# Patient Record
Sex: Female | Born: 1955 | Race: White | Hispanic: No | Marital: Married
Health system: Midwestern US, Community
[De-identification: ages and names within clinical notes are randomized; demographics above are authoritative.]

## PROBLEM LIST (undated history)

## (undated) DIAGNOSIS — I214 Non-ST elevation (NSTEMI) myocardial infarction: Secondary | ICD-10-CM

## (undated) DIAGNOSIS — J449 Chronic obstructive pulmonary disease, unspecified: Secondary | ICD-10-CM

## (undated) DIAGNOSIS — I2510 Atherosclerotic heart disease of native coronary artery without angina pectoris: Secondary | ICD-10-CM

## (undated) DIAGNOSIS — K219 Gastro-esophageal reflux disease without esophagitis: Secondary | ICD-10-CM

## (undated) HISTORY — PX: TUBAL LIGATION: SHX77

---

## 2013-04-29 MED ORDER — DEXAMETHASONE SODIUM PHOSPHATE 4 MG/ML IJ SOLN
4 mg/mL | Freq: Once | INTRAMUSCULAR | Status: AC
Start: 2013-04-29 — End: 2013-04-29

## 2013-04-29 MED ORDER — PREDNISONE 20 MG TAB
20 mg | ORAL_TABLET | ORAL | Status: DC
Start: 2013-04-29 — End: 2013-11-30

## 2013-04-29 MED ORDER — CYCLOBENZAPRINE 5 MG TAB
5 mg | ORAL_TABLET | Freq: Two times a day (BID) | ORAL | Status: DC | PRN
Start: 2013-04-29 — End: 2013-11-30

## 2013-04-29 NOTE — Progress Notes (Signed)
[facility]   [address] [place], [place] [address]  Phone [phone] * Fax [phone]    04/29/2013    Chief Complaint   Patient presents with   ??? Back Pain     hurt back this am at work       HISTORY OF PRESENT ILLNESS:    Kelsey Mullins is a 57 y.o. female who presents today with acute onset low back pain.  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  The pain is now radiating to her upper back.        Review of Systems   Genitourinary: Negative.    Musculoskeletal: Positive for back pain.   Neurological: Negative for tingling and focal weakness.         No Known Allergies    Past Medical History   Diagnosis Date   ??? GERD (gastroesophageal reflux disease)    ??? Depression    ??? Asthma      History reviewed. No pertinent past surgical history.    Current Outpatient Prescriptions   Medication Sig Dispense Refill   ??? dexamethasone (DECADRON) 4 mg/mL injection 1 mL by IntraMUSCular route once for 1 dose.  1 Vial  0   ??? predniSONE (DELTASONE) 20 mg tablet 60 mg x 1 day 40 mg x 4 days then 20 mg x 4 days.  15 Tab  0   ??? cyclobenzaprine (FLEXERIL) 5 mg tablet Take 1 Tab by mouth two (2) times daily as needed for Muscle Spasm(s).  25 Tab  0   ??? Dexlansoprazole (DEXILANT) 60 mg CpDB Take  by mouth.       ??? citalopram (CELEXA) 10 mg tablet Take  by mouth daily.       ??? fluticasone-salmeterol (ADVAIR DISKUS) 250-50 mcg/dose diskus inhaler Take 1 Puff by inhalation every twelve (12) hours.         History   Substance Use Topics   ??? Smoking status: Current Every Day Smoker   ??? Smokeless tobacco: Not on file   ??? Alcohol Use: No         PHYSICAL EXAMINATION:    BP 140/80   Pulse 70   Temp(Src) 97 ??F (36.1 ??C) (Oral)   Resp 18    Physical Exam   Vitals reviewed.  Constitutional: She appears well-developed and well-nourished. She appears distressed (secondary to back pain ).   Cardiovascular: Normal heart sounds.     Pulmonary/Chest: Effort normal and breath sounds normal.   Musculoskeletal:        Thoracic back: Normal.        Lumbar back: She exhibits tenderness, pain and spasm (of paraspinal muscles R>L). She exhibits no bony tenderness.   Neurological:   Strength equal bilaterally in the lower extremities 3/5 secondary to pain.        ASSESSMENT AND PLAN:      ICD-9-CM    1. Acute low back pain 724.2 DEXAMETHASONE SODIUM PHOSPHATE INJECTION 1 MG     dexamethasone (DECADRON) 4 mg/mL injection     PR THER/PROPH/DIAG INJECTION, SUBCUT/IM     predniSONE (DELTASONE) 20 mg tablet     cyclobenzaprine (FLEXERIL) 5 mg tablet     Discussed conservative care with use of ICE for 24 hours then heat as needed for pain.  Gentle stretching as tolerated.  No physical activity or labor for  4 days. A letter to return to work for Monday 05/04/13.    Patient reports she has had chiropractic care in the past.  Discussed benefits for this acute incident,  yet to wait until at least next week, until pain has decreased.     Follow-up Disposition:  Return if symptoms worsen or fail to improve.      Kelsey Mullins. Belva Crome, MHP

## 2013-05-05 NOTE — Progress Notes (Signed)
Copy of Physician's Report forwarded to [facility] [employer] to reflect status of work return and work level  Phone [phone]     Fax [phone]

## 2013-11-30 MED ORDER — DEXAMETHASONE SODIUM PHOSPHATE 4 MG/ML IJ SOLN
4 mg/mL | Freq: Once | INTRAMUSCULAR | Status: AC
Start: 2013-11-30 — End: 2013-11-30

## 2013-11-30 NOTE — Progress Notes (Signed)
HISTORY OF PRESENT ILLNESS  Kelsey Mullins is a 57 y.o. female.  Chief Complaint   Patient presents with   ??? Eye Swelling       HPI  Eyes puffy since this AM  Had hair died on 05-07-2023  Got itching that evening  Used same as prev  Tried 2 Benadryl for itching yesterday  No new dyes to her knowledge  Got it washed again  ROS  Past Medical History   Diagnosis Date   ??? GERD (gastroesophageal reflux disease)    ??? Depression    ??? Asthma      Current Outpatient Prescriptions   Medication Sig Dispense Refill   ??? dexamethasone (DECADRON) 4 mg/mL injection 1 mL by IntraMUSCular route once for 1 dose.  1 vial  0   ??? Dexlansoprazole (DEXILANT) 60 mg CpDB Take  by mouth.       ??? fluticasone-salmeterol (ADVAIR DISKUS) 250-50 mcg/dose diskus inhaler Take 1 Puff by inhalation every twelve (12) hours.         No Known Allergies  BP 130/70   Pulse 68   Temp(Src) 97.9 ??F (36.6 ??C) (Temporal)   Resp 18   Ht 5\' 5"  (1.651 m)   Wt 179 lb (81.194 kg)   BMI 29.79 kg/m2    Physical Exam   Nursing note and vitals reviewed.  Constitutional: She is oriented to person, place, and time. She appears well-developed and well-nourished. No distress.   HENT:   Head: Normocephalic and atraumatic.   Eyes: Conjunctivae and EOM are normal.   Swollen eyelids   Pulmonary/Chest: Effort normal.   Neurological: She is alert and oriented to person, place, and time.   Skin: Skin is warm.   Erythematous scalp, swollen top of ears with blistery lesions with yellow drainage   Psychiatric: She has a normal mood and affect.       ASSESSMENT and PLAN    ICD-9-CM    1. Allergic dermatitis 692.9 DEXAMETHASONE SODIUM PHOSPHATE INJECTION 1 MG     dexamethasone (DECADRON) 4 mg/mL injection     PR THER/PROPH/DIAG INJECTION, SUBCUT/IM   Claritin during the day, Benadryl at night, Zantac prn if needed in addition

## 2017-05-19 ENCOUNTER — Encounter (HOSPITAL_COMMUNITY): Payer: Self-pay

## 2017-05-19 ENCOUNTER — Inpatient Hospital Stay (HOSPITAL_COMMUNITY)
Admission: EM | Admit: 2017-05-19 | Discharge: 2017-05-26 | DRG: 233 | Disposition: A | Discharge status: Home / Self Care | Payer: BLUE CROSS/BLUE SHIELD | Attending: Thoracic Surgery (Cardiothoracic Vascular Surgery) | Admitting: Thoracic Surgery (Cardiothoracic Vascular Surgery)

## 2017-05-19 ENCOUNTER — Emergency Department (HOSPITAL_COMMUNITY): Payer: BLUE CROSS/BLUE SHIELD

## 2017-05-19 ENCOUNTER — Observation Stay (HOSPITAL_COMMUNITY): Payer: BLUE CROSS/BLUE SHIELD

## 2017-05-19 DIAGNOSIS — R1013 Epigastric pain: Secondary | ICD-10-CM

## 2017-05-19 DIAGNOSIS — F17200 Nicotine dependence, unspecified, uncomplicated: Secondary | ICD-10-CM | POA: Diagnosis present

## 2017-05-19 DIAGNOSIS — Z79899 Other long term (current) drug therapy: Secondary | ICD-10-CM

## 2017-05-19 DIAGNOSIS — K219 Gastro-esophageal reflux disease without esophagitis: Secondary | ICD-10-CM | POA: Diagnosis present

## 2017-05-19 DIAGNOSIS — R079 Chest pain, unspecified: Secondary | ICD-10-CM | POA: Diagnosis present

## 2017-05-19 DIAGNOSIS — J440 Chronic obstructive pulmonary disease with acute lower respiratory infection: Secondary | ICD-10-CM | POA: Diagnosis present

## 2017-05-19 DIAGNOSIS — J449 Chronic obstructive pulmonary disease, unspecified: Secondary | ICD-10-CM | POA: Diagnosis present

## 2017-05-19 DIAGNOSIS — J189 Pneumonia, unspecified organism: Secondary | ICD-10-CM | POA: Diagnosis not present

## 2017-05-19 DIAGNOSIS — R778 Other specified abnormalities of plasma proteins: Secondary | ICD-10-CM | POA: Diagnosis present

## 2017-05-19 DIAGNOSIS — I214 Non-ST elevation (NSTEMI) myocardial infarction: Principal | ICD-10-CM | POA: Diagnosis present

## 2017-05-19 DIAGNOSIS — I2510 Atherosclerotic heart disease of native coronary artery without angina pectoris: Secondary | ICD-10-CM

## 2017-05-19 DIAGNOSIS — J90 Pleural effusion, not elsewhere classified: Secondary | ICD-10-CM | POA: Diagnosis not present

## 2017-05-19 DIAGNOSIS — I200 Unstable angina: Secondary | ICD-10-CM

## 2017-05-19 DIAGNOSIS — K5900 Constipation, unspecified: Secondary | ICD-10-CM | POA: Diagnosis not present

## 2017-05-19 DIAGNOSIS — I252 Old myocardial infarction: Secondary | ICD-10-CM

## 2017-05-19 DIAGNOSIS — Z6833 Body mass index (BMI) 33.0-33.9, adult: Secondary | ICD-10-CM

## 2017-05-19 DIAGNOSIS — J9811 Atelectasis: Secondary | ICD-10-CM | POA: Diagnosis not present

## 2017-05-19 DIAGNOSIS — Z951 Presence of aortocoronary bypass graft: Secondary | ICD-10-CM

## 2017-05-19 DIAGNOSIS — R7989 Other specified abnormal findings of blood chemistry: Secondary | ICD-10-CM

## 2017-05-19 DIAGNOSIS — R748 Abnormal levels of other serum enzymes: Secondary | ICD-10-CM | POA: Diagnosis not present

## 2017-05-19 DIAGNOSIS — I25110 Atherosclerotic heart disease of native coronary artery with unstable angina pectoris: Secondary | ICD-10-CM | POA: Diagnosis present

## 2017-05-19 DIAGNOSIS — F17210 Nicotine dependence, cigarettes, uncomplicated: Secondary | ICD-10-CM | POA: Diagnosis present

## 2017-05-19 DIAGNOSIS — E8770 Fluid overload, unspecified: Secondary | ICD-10-CM | POA: Diagnosis not present

## 2017-05-19 HISTORY — DX: Chronic obstructive pulmonary disease, unspecified: J44.9

## 2017-05-19 HISTORY — DX: Gastro-esophageal reflux disease without esophagitis: K21.9

## 2017-05-19 LAB — CREATININE, SERUM
Creatinine, Ser: 0.58 mg/dL (ref 0.44–1.00)
GFR calc Af Amer: >60 mL/min (ref >60)
GFR calc non Af Amer: >60 mL/min (ref >60)

## 2017-05-19 LAB — CBC
HCT: 46.7 % — ABNORMAL HIGH (ref 36.0–46.0)
HCT: 44.1 % (ref 36.0–46.0)
HCT: 44.9 % (ref 36.0–46.0)
HEMOGLOBIN: 15.5 g/dL — AB (ref 12.0–15.0)
Hemoglobin: 14.6 g/dL (ref 12.0–15.0)
Hemoglobin: 14.8 g/dL (ref 12.0–15.0)
MCH: 31.9 pg (ref 26.0–34.0)
MCH: 31.4 pg (ref 26.0–34.0)
MCH: 31.6 pg (ref 26.0–34.0)
MCHC: 33.2 g/dL (ref 30.0–36.0)
MCHC: 33 g/dL (ref 30.0–36.0)
MCHC: 33.1 g/dL (ref 30.0–36.0)
MCV: 96.1 fL (ref 78.0–100.0)
MCV: 95.3 fL (ref 78.0–100.0)
MCV: 95.5 fL (ref 78.0–100.0)
PLATELETS: 216 10*3/uL (ref 150–400)
Platelets: 216 10*3/uL (ref 150–400)
Platelets: 229 10*3/uL (ref 150–400)
RBC: 4.86 MIL/uL (ref 3.87–5.11)
RBC: 4.62 MIL/uL (ref 3.87–5.11)
RBC: 4.71 MIL/uL (ref 3.87–5.11)
RDW: 13.1 % (ref 11.5–15.5)
RDW: 13 % (ref 11.5–15.5)
RDW: 13.1 % (ref 11.5–15.5)
WBC: 7.6 10*3/uL (ref 4.0–10.5)
WBC: 9 10*3/uL (ref 4.0–10.5)
WBC: 9.4 10*3/uL (ref 4.0–10.5)

## 2017-05-19 LAB — BASIC METABOLIC PANEL
ANION GAP: 8 (ref 5–15)
Anion gap: 7 (ref 5–15)
BUN: 11 mg/dL (ref 6–20)
BUN: 10 mg/dL (ref 6–20)
CALCIUM: 9.3 mg/dL (ref 8.9–10.3)
CHLORIDE: 107 mmol/L (ref 101–111)
CO2: 25 mmol/L (ref 22–32)
CO2: 26 mmol/L (ref 22–32)
CREATININE: 0.57 mg/dL (ref 0.44–1.00)
Calcium: 9 mg/dL (ref 8.9–10.3)
Chloride: 106 mmol/L (ref 101–111)
Creatinine, Ser: 0.57 mg/dL (ref 0.44–1.00)
GFR calc Af Amer: >60 mL/min (ref >60)
GFR calc non Af Amer: >60 mL/min (ref >60)
GFR calc non Af Amer: >60 mL/min (ref >60)
Glucose, Bld: 133 mg/dL — ABNORMAL HIGH (ref 65–99)
Glucose, Bld: 101 mg/dL — ABNORMAL HIGH (ref 65–99)
Potassium: 4.1 mmol/L (ref 3.5–5.1)
Potassium: 3.8 mmol/L (ref 3.5–5.1)
SODIUM: 140 mmol/L (ref 135–145)
Sodium: 139 mmol/L (ref 135–145)

## 2017-05-19 LAB — I-STAT TROPONIN, ED
TROPONIN I, POC: 0.09 ng/mL — AB (ref 0.00–0.08)
TROPONIN I, POC: 0.88 ng/mL — AB (ref 0.00–0.08)

## 2017-05-19 LAB — LIPASE, BLOOD: LIPASE: 25 U/L (ref 11–51)

## 2017-05-19 LAB — HEPATIC FUNCTION PANEL
ALT: 27 U/L (ref 14–54)
AST: 23 U/L (ref 15–41)
Albumin: 3.7 g/dL (ref 3.5–5.0)
Alkaline Phosphatase: 93 U/L (ref 38–126)
TOTAL PROTEIN: 7 g/dL (ref 6.5–8.1)
Total Bilirubin: 0.6 mg/dL (ref 0.3–1.2)

## 2017-05-19 LAB — PROTIME-INR
INR: 0.95
Prothrombin Time: 12.7 seconds (ref 11.4–15.2)

## 2017-05-19 LAB — TROPONIN I
Troponin I: 0.92 ng/mL (ref <0.03)
Troponin I: 0.8 ng/mL (ref <0.03)

## 2017-05-19 MED ORDER — ALPRAZOLAM 0.25 MG PO TABS
0.2500 mg | ORAL_TABLET | Freq: Two times a day (BID) | ORAL | Status: DC | PRN
  Administered 2017-05-20: 0.25 mg via ORAL
  Filled 2017-05-19: qty 1

## 2017-05-19 MED ORDER — DIATRIZOATE MEGLUMINE & SODIUM 66-10 % PO SOLN
ORAL | Status: AC
  Filled 2017-05-19: qty 30

## 2017-05-19 MED ORDER — SODIUM CHLORIDE 0.9 % IV SOLN: 250.0000 mL | INTRAVENOUS | Status: DC | PRN

## 2017-05-19 MED ORDER — ZOLPIDEM TARTRATE 5 MG PO TABS
5.0000 mg | ORAL_TABLET | Freq: Every evening | ORAL | Status: DC | PRN
  Administered 2017-05-20 – 2017-05-21 (×2): 5 mg via ORAL
  Filled 2017-05-19 (×2): qty 1

## 2017-05-19 MED ORDER — ONDANSETRON HCL 4 MG/2ML IJ SOLN
4.0000 mg | Freq: Once | INTRAMUSCULAR | Status: AC
  Administered 2017-05-19: 4 mg via INTRAVENOUS
  Filled 2017-05-19: qty 2

## 2017-05-19 MED ORDER — NICOTINE 14 MG/24HR TD PT24
14.0000 mg | MEDICATED_PATCH | Freq: Every day | TRANSDERMAL | Status: DC
  Administered 2017-05-19 – 2017-05-21 (×3): 14 mg via TRANSDERMAL
  Filled 2017-05-19 (×3): qty 1

## 2017-05-19 MED ORDER — NITROGLYCERIN 2 % TD OINT
0.5000 [in_us] | TOPICAL_OINTMENT | Freq: Four times a day (QID) | TRANSDERMAL | Status: DC
  Administered 2017-05-19 – 2017-05-20 (×3): 0.5 [in_us] via TOPICAL

## 2017-05-19 MED ORDER — ASPIRIN 81 MG PO CHEW
81.0000 mg | CHEWABLE_TABLET | ORAL | Status: AC
  Administered 2017-05-20: 81 mg via ORAL

## 2017-05-19 MED ORDER — SODIUM CHLORIDE 0.9 % WEIGHT BASED INFUSION
1.0000 mL/kg/h | INTRAVENOUS | Status: DC
  Administered 2017-05-19 – 2017-05-20 (×2): 1 mL/kg/h via INTRAVENOUS

## 2017-05-19 MED ORDER — PANTOPRAZOLE SODIUM 40 MG IV SOLR
40.0000 mg | Freq: Once | INTRAVENOUS | Status: AC
  Administered 2017-05-19: 40 mg via INTRAVENOUS
  Filled 2017-05-19: qty 40

## 2017-05-19 MED ORDER — PANTOPRAZOLE SODIUM 40 MG PO TBEC
40.0000 mg | DELAYED_RELEASE_TABLET | Freq: Every day | ORAL | Status: DC
  Administered 2017-05-20 – 2017-05-22 (×3): 40 mg via ORAL
  Filled 2017-05-19 (×3): qty 1

## 2017-05-19 MED ORDER — GI COCKTAIL ~~LOC~~
30.0000 mL | Freq: Once | ORAL | Status: AC
  Administered 2017-05-19: 30 mL via ORAL
  Filled 2017-05-19: qty 30

## 2017-05-19 MED ORDER — ONDANSETRON HCL 4 MG/2ML IJ SOLN
4.0000 mg | Freq: Four times a day (QID) | INTRAMUSCULAR | Status: DC | PRN
  Administered 2017-05-20: 4 mg via INTRAVENOUS
  Filled 2017-05-19: qty 2

## 2017-05-19 MED ORDER — ASPIRIN 81 MG PO CHEW
81.0000 mg | CHEWABLE_TABLET | Freq: Every day | ORAL | Status: DC
  Administered 2017-05-21: 81 mg via ORAL
  Filled 2017-05-19 (×2): qty 1

## 2017-05-19 MED ORDER — GI COCKTAIL ~~LOC~~
30.0000 mL | Freq: Three times a day (TID) | ORAL | Status: DC | PRN
  Filled 2017-05-19 (×2): qty 30

## 2017-05-19 MED ORDER — NITROGLYCERIN 0.4 MG SL SUBL
0.4000 mg | SUBLINGUAL_TABLET | SUBLINGUAL | Status: AC | PRN
  Administered 2017-05-19 (×3): 0.4 mg via SUBLINGUAL
  Filled 2017-05-19 (×2): qty 1

## 2017-05-19 MED ORDER — IOPAMIDOL (ISOVUE-300) INJECTION 61%
INTRAVENOUS | Status: AC
  Administered 2017-05-19: 75 mL
  Filled 2017-05-19: qty 75

## 2017-05-19 MED ORDER — ACETAMINOPHEN 325 MG PO TABS
650.0000 mg | ORAL_TABLET | ORAL | Status: DC | PRN
  Administered 2017-05-20 – 2017-05-21 (×5): 650 mg via ORAL
  Filled 2017-05-19 (×5): qty 2

## 2017-05-19 MED ORDER — HEPARIN SODIUM (PORCINE) 5000 UNIT/ML IJ SOLN
5000.0000 [IU] | Freq: Three times a day (TID) | INTRAMUSCULAR | Status: DC
  Administered 2017-05-19 – 2017-05-20 (×2): 5000 [IU] via SUBCUTANEOUS
  Filled 2017-05-19 (×3): qty 1

## 2017-05-19 MED ORDER — ASPIRIN 81 MG PO CHEW: 324.0000 mg | CHEWABLE_TABLET | Freq: Once | ORAL | Status: DC

## 2017-05-19 MED ORDER — NITROGLYCERIN 0.4 MG SL SUBL
SUBLINGUAL_TABLET | SUBLINGUAL | Status: AC
  Administered 2017-05-19 (×2): 0.4 mg
  Filled 2017-05-19: qty 1

## 2017-05-19 NOTE — ED Triage Notes (Signed)
To room via triage.  Onset 2 days ago while travelling from [place] to [place] pt started having epigastric pain that is worsening.  Pt unable to sleep last night d/t pain.  Nothing makes better.  Eating, moving makes worse.  Pt had endoscopy 05-17-17 for heartburn, biopsy done, hasn't heard results yet,Prilosec was changed from qd to bid.

## 2017-05-19 NOTE — ED Provider Notes (Signed)
[facility] DEPT Provider Note   CSN: [id number] Arrival date & time: 05/19/17  1216     History   Chief Complaint Chief Complaint  Patient presents with  . Chest Pain    HPI Sharon Cochran is a 61 y.o. female.  HPI Patient with history of COPD and gastritis presents with epigastric pain radiating up into the chest has been constant since her endoscopy on Friday. She's been taking her PPI's instructed. She takes diclofenac for arthritic pain. States she's had nausea but no vomiting. No melanotic stools. Denies any fever or chills. Past Medical History:  Diagnosis Date  . COPD (chronic obstructive pulmonary disease) (HCC)   . GERD (gastroesophageal reflux disease)     Patient Active Problem List   Diagnosis Date Noted  . Non-STEMI (non-ST elevated myocardial infarction) (HCC) 05/20/2017  . Epigastric pain   . Unstable angina (HCC)   . CAD in native artery   . Chest pain with moderate risk of acute coronary syndrome 05/19/2017  . Troponin level elevated 05/19/2017  . GERD (gastroesophageal reflux disease) 05/19/2017  . Smoker 05/19/2017  . COPD (chronic obstructive pulmonary disease) (HCC) 05/19/2017    Past Surgical History:  Procedure Laterality Date  . INTRAVASCULAR PRESSURE WIRE/FFR STUDY N/A 05/20/2017   Procedure: Intravascular Pressure Wire/FFR Study;  Surgeon: Yvonne Kendall, MD;  Location: [facility] IN[facility]AS[facility] CV [facility];  Service: Cardiovascular;  Laterality: N/A;  . LEFT HEART CATH AND CORONARY ANGIOGRAPHY N/A 05/20/2017   Procedure: Left Heart Cath and Coronary Angiography;  Surgeon: Yvonne Kendall, MD;  Location: [facility] INVAS[facility] CV LAB;  Service: Cardiovascular;  Laterality: N/A;  . TUBAL LIGATION      OB History    No data available       Home Medications    Prior to Admission medications   Medication Sig Start Date End Date Taking? Authorizing Provider  albuterol (PROVENTIL HFA;VENTOLIN HFA) 108 (90 Base) MCG/ACT inhaler Inhale 1-2 puffs into the lungs  every 6 (six) hours as needed for wheezing or shortness of breath.   Yes [provider]  albuterol (PROVENTIL) (2.5 MG/3ML) 0.083% nebulizer solution Take 2.5 mg by nebulization every 6 (six) hours as needed for wheezing or shortness of breath.   Yes [provider]  diclofenac (CATAFLAM) 50 MG tablet Take 50 mg by mouth 2 (two) times daily. 05/09/17  Yes [provider]  Fluticasone-Salmeterol (ADVAIR) 250-50 MCG/DOSE AEPB Inhale 1 puff into the lungs 2 (two) times daily.   Yes [provider]  metroNIDAZOLE (METROGEL) 1 % gel Apply 1 application topically at bedtime.   Yes [provider]  omeprazole (PRILOSEC) 20 MG capsule Take 20 mg by mouth daily.   Yes [provider]  venlafaxine XR (EFFEXOR-XR) 75 MG 24 hr capsule Take 75 mg by mouth daily with breakfast.   Yes [provider]  traZODone (DESYREL) 50 MG tablet Take 50 mg by mouth at bedtime.    [provider]    Family History History reviewed. No pertinent family history.  Social History Social History  Substance Use Topics  . Smoking status: Current Every Day Smoker    Packs/day: 1.00    Types: Cigarettes  . Smokeless tobacco: Never Used  . Alcohol use 2.4 oz/week    4 Shots of liquor per week     Allergies   Patient has no known allergies.   Review of Systems Review of Systems  Constitutional: Negative for chills and fever.  Respiratory: Negative for cough and shortness of  breath.   Cardiovascular: Positive for chest pain. Negative for palpitations and leg swelling.  Gastrointestinal: Positive for abdominal pain and nausea. Negative for constipation, diarrhea and vomiting.  Musculoskeletal: Negative for back pain, myalgias, neck pain and neck stiffness.  Skin: Negative for rash and wound.  Neurological: Negative for dizziness, weakness, light-headedness, numbness and headaches.  All other systems reviewed and are negative.    Physical  Exam Updated Vital Signs BP 130/78   Pulse 85   Temp 98.7 F (37.1 C) (Axillary)   Resp 16   Ht 5' (1.524 m)   Wt 74.9 kg (165 lb 1.6 oz)   SpO2 95%   BMI 32.24 kg/m   Physical Exam  Constitutional: She is oriented to person, place, and time. She appears well-developed and well-nourished.  Tearful  HENT:  Head: Normocephalic and atraumatic.  Mouth/Throat: Oropharynx is clear and moist. No oropharyngeal exudate.  Eyes: EOM are normal. Pupils are equal, round, and reactive to light.  Neck: Normal range of motion. Neck supple.  Cardiovascular: Normal rate and regular rhythm.  Exam reveals no gallop and no friction rub.   No murmur heard. Pulmonary/Chest: Effort normal and breath sounds normal. No respiratory distress. She has no wheezes. She has no rales. She exhibits no tenderness.  Abdominal: Soft. Bowel sounds are normal. There is tenderness (mild epigastric and left upper quadrant tenderness to palpation.). There is no rebound and no guarding.  Musculoskeletal: Normal range of motion. She exhibits no edema or tenderness.  No CVA tenderness bilaterally. No lower extremity swelling or asymmetry.  Neurological: She is alert and oriented to person, place, and time.  Moving all Extremities without focal deficit. Sensation intact.  Skin: Skin is warm and dry. Capillary refill takes less than 2 seconds. No rash noted. No erythema.  Psychiatric: She has a normal mood and affect. Her behavior is normal.  Nursing note and vitals reviewed.    ED Treatments / Results  Labs (all labs ordered are listed, but only abnormal results are displayed) Labs Reviewed  BASIC METABOLIC PANEL - Abnormal; Notable for the following:       Result Value   Glucose, Bld 133 (*)    All other components within normal limits  CBC - Abnormal; Notable for the following:    Hemoglobin 15.5 (*)    HCT 46.7 (*)    All other components within normal limits  HEPATIC FUNCTION PANEL - Abnormal; Notable for the  following:    Bilirubin, Direct <0.1 (*)    All other components within normal limits  TROPONIN I - Abnormal; Notable for the following:    Troponin I 0.92 (*)    All other components within normal limits  TROPONIN I - Abnormal; Notable for the following:    Troponin I 0.80 (*)    All other components within normal limits  TROPONIN I - Abnormal; Notable for the following:    Troponin I 0.37 (*)    All other components within normal limits  TROPONIN I - Abnormal; Notable for the following:    Troponin I 0.20 (*)    All other components within normal limits  BASIC METABOLIC PANEL - Abnormal; Notable for the following:    Glucose, Bld 101 (*)    All other components within normal limits  LIPID PANEL - Abnormal; Notable for the following:    HDL 32 (*)    All other components within normal limits  I-STAT TROPOININ, ED - Abnormal; Notable for the following:  Troponin i, poc 0.09 (*)    All other components within normal limits  I-STAT TROPOININ, ED - Abnormal; Notable for the following:    Troponin i, poc 0.88 (*)    All other components within normal limits  LIPASE, BLOOD  HIV ANTIBODY (ROUTINE TESTING)  CBC  CREATININE, SERUM  PROTIME-INR  CBC  HEPARIN LEVEL (UNFRACTIONATED)  CBC  POCT ACTIVATED CLOTTING TIME    EKG  EKG Interpretation  Date/Time:  Sunday May 19 2017 16:39:11 EDT Ventricular Rate:  69 PR Interval:    QRS Duration: 109 QT Interval:  418 QTC Calculation: 448 R Axis:   -7 Text Interpretation:  Sinus rhythm Low voltage, extremity leads Abnormal R-wave progression, early transition Confirmed by Drema Pry [phone]) on 05/20/2017 11:48:58 PM       Radiology Dg Chest 2 View  Result Date: 05/19/2017 CLINICAL DATA:  Epigastric abdominal pain beginning 2 days ago. History of COPD. EXAM: CHEST  2 VIEW COMPARISON:  None. FINDINGS: Normal cardiac silhouette and mediastinal contours. The lungs are hyperexpanded with flattening of the bilateral hemidiaphragms.  No focal parenchymal opacities. No pleural effusion or pneumothorax. No evidence of edema. No acute osseous abnormalities. IMPRESSION: Hyperexpanded lungs without acute cardiopulmonary disease. Specifically, no focal airspace opacities to suggest pneumonia. Electronically Signed   By: Simonne Come M.D.   On: 05/19/2017 12:51   Ct Chest W Contrast  Result Date: 05/19/2017 CLINICAL DATA:  Epigastric pain 72 hours after endoscopy. EXAM: CT CHEST WITHOUT AND WITH CONTRAST TECHNIQUE: Protocol was requested by GI. A without chest CT was obtained. Subsequently Multidetector CT imaging of the chest was performed during intravenous and oral contrast administration. CONTRAST:  75mL ISOVUE-300 IOPAMIDOL (ISOVUE-300) INJECTION 61%IV COMPARISON:  None. FINDINGS: Cardiovascular: Normal heart size. No pericardial effusion. No evidence of acute vascular disease. Mild atherosclerotic calcifications seen along the distal brachiocephalic. Mediastinum/Nodes: No pneumomediastinum, esophageal thickening, or extravasation of the oral contrast which is seen throughout the esophagus. Negative for adenopathy or swelling. Lungs/Pleura: Negative for pneumonia or air leak. Upper Abdomen: The stomach was covered and is negative for wall thickening. No epigastric pneumoperitoneum. Musculoskeletal: No acute or aggressive finding. Probable dermal inclusion cyst in the subcutaneous right paramedian lower back. IMPRESSION: No acute finding. There is no pneumomediastinum or extravasation of esophageal contrast. The stomach was visualized and negative. Electronically Signed   By: Marnee Spring M.D.   On: 05/19/2017 20:40    Procedures Procedures (including critical care time)  Medications Ordered in ED Medications  aspirin chewable tablet 324 mg ( Oral MAR Unhold 05/20/17 1011)  acetaminophen (TYLENOL) tablet 650 mg (650 mg Oral Given 05/20/17 1451)  ondansetron (ZOFRAN) injection 4 mg (4 mg Intravenous Given 05/20/17 1420)  0.9 %  sodium  chloride infusion ( Intravenous MAR Unhold 05/20/17 1011)  ALPRAZolam (XANAX) tablet 0.25 mg (0.25 mg Oral Given 05/20/17 2213)  zolpidem (AMBIEN) tablet 5 mg ( Oral MAR Unhold 05/20/17 1011)  pantoprazole (PROTONIX) EC tablet 40 mg ( Oral MAR Unhold 05/20/17 1011)  aspirin chewable tablet 81 mg ( Oral MAR Unhold 05/20/17 1011)  gi cocktail (Maalox,Lidocaine,Donnatal) ( Oral MAR Unhold 05/20/17 1011)  diatrizoate meglumine-sodium (GASTROGRAFIN) 66-10 % solution (not administered)  nicotine (NICODERM CQ - dosed in mg/24 hours) patch 14 mg (14 mg Transdermal Patch Applied 05/20/17 1045)  0.9 %  sodium chloride infusion ( Intravenous Transfusing/Transfer 05/20/17 1048)  sodium chloride flush (NS) 0.9 % injection 3 mL (3 mLs Intravenous Not Given 05/20/17 2200)  sodium chloride flush (NS) 0.9 %  injection 3 mL (not administered)  0.9 %  sodium chloride infusion (not administered)  atorvastatin (LIPITOR) tablet 80 mg (80 mg Oral Given 05/20/17 1933)  metoprolol tartrate (LOPRESSOR) tablet 12.5 mg (12.5 mg Oral Given 05/20/17 2213)  venlafaxine XR (EFFEXOR-XR) 24 hr capsule 75 mg (75 mg Oral Given 05/20/17 1131)  traMADol (ULTRAM) tablet 50 mg (50 mg Oral Given 05/20/17 1933)  heparin ADULT infusion 100 units/mL (25000 units/258mL sodium chloride 0.45%) (900 Units/hr Intravenous New Bag/Given 05/20/17 1959)  nitroGLYCERIN 50 mg in dextrose 5 % 250 mL (0.2 mg/mL) infusion (5 mcg Intravenous New Bag/Given 05/20/17 1909)  nitroGLYCERIN (NITROSTAT) SL tablet 0.4 mg (not administered)  gi cocktail (Maalox,Lidocaine,Donnatal) (30 mLs Oral Given 05/19/17 1332)  ondansetron (ZOFRAN) injection 4 mg (4 mg Intravenous Given 05/19/17 1332)  pantoprazole (PROTONIX) injection 40 mg (40 mg Intravenous Given 05/19/17 1332)  nitroGLYCERIN (NITROSTAT) SL tablet 0.4 mg (0.4 mg Sublingual Given 05/19/17 1833)  aspirin chewable tablet 81 mg (81 mg Oral Given 05/20/17 0517)  iopamidol (ISOVUE-300) 61 % injection (75 mLs  Contrast Given 05/19/17 2005)    nitroGLYCERIN (NITROSTAT) 0.4 MG SL tablet (0.4 mg  Given 05/19/17 2238)  heparin infusion 2 units/mL in 0.9 % sodium chloride (1,000 mLs Intra-arterial New Bag/Given 05/20/17 0852)  adenosine (diagnostic) 140 mcg/kg/min (140 mcg/kg/min  74.9 kg Intravenous New Bag/Given 05/20/17 0937)   CRITICAL CARE Performed by: Ranae Palms, Siennah Barrasso Total critical care time: 30 minutes Critical care time was exclusive of separately billable procedures and treating other patients. Critical care was necessary to treat or prevent imminent or life-threatening deterioration. Critical care was time spent personally by me on the following activities: development of treatment plan with patient and/or surrogate as well as nursing, discussions with consultants, evaluation of patient's response to treatment, examination of patient, obtaining history from patient or surrogate, ordering and performing treatments and interventions, ordering and review of laboratory studies, ordering and review of radiographic studies, pulse oximetry and re-evaluation of patient's condition.  Initial Impression / Assessment and Plan / ED Course  I have reviewed the triage vital signs and the nursing notes.  Pertinent labs & imaging results that were available during my care of the patient were reviewed by me and considered in my medical decision making (see chart for details).    Repeat troponin is significantly elevated. Discussed with cardiology who will see patient in emergency department.   Final Clinical Impressions(s) / ED Diagnoses   Final diagnoses:  Epigastric pain    New Prescriptions Current Discharge Medication List       Loren Racer, MD 05/21/17 [phone]

## 2017-05-19 NOTE — ED Notes (Signed)
Patient transported to X-ray 

## 2017-05-19 NOTE — H&P (Signed)
Patient ID: Sharon Cochran MRN: [id number], DOB/AGE: [birth year]   Admit date: 05/19/2017   Primary Physician: System, Pcp Not In Primary Cardiologist: New  HPI: 61 y/o overweight Caucasian female with a history of GERD, smoking, and COPD. She and her husband live in [place] and are here for her nephew's graduation. She recently (Friday 6/1) had an endoscopy with biopsy in [facility]. She says she did not have a dilatation. She apprently has been having epigastric pain for some time. She had a negative POET two weeks ago per her history.  Ever since the evening of her endoscopy she has been having epigastric discomfort. It does not radiate. It does seem to be worse with inspiration. She was going to call her doctor Monday if it wasn't better but today while at church it got so bad she had to get up and leave. She has received SL NTG and GI cocktail, she is not sure which one helped. Her Troponin poc is elevated-0.09- 0.88. She continues to have epigastric discomfort. EKG shows no acute changes.   Problem List: Past Medical History:  Diagnosis Date  . COPD (chronic obstructive pulmonary disease) (HCC)     History reviewed. No pertinent surgical history.   Allergies: No Known Allergies   Home Medications Inhalers prn  Past Medical History:  Diagnosis Date  . COPD (chronic obstructive pulmonary disease) (HCC)     Prior to Admission medications   Not on File     FM Hx- no immediate family history of CAD or MI   Social History   Social History  . Marital status: Married    Spouse name: N/A  . Number of children: N/A  . Years of education: N/A   Occupational History  . Not on file.   Social History Main Topics  . Smoking status: Current Every Day Smoker    Packs/day: 1.00    Types: Cigarettes  . Smokeless tobacco: Never Used  . Alcohol use 2.4 oz/week    4 Shots of liquor per week  . Drug use: No  . Sexual activity: Not on file   Other Topics Concern    . Not on file   Social History Narrative  . No narrative on file     Review of Systems: General: negative for chills, fever, night sweats or weight changes.  Cardiovascular: negative for dyspnea on exertion, edema, orthopnea, palpitations, paroxysmal nocturnal dyspnea or shortness of breath HEENT: negative for any visual disturbances, blindness, glaucoma Dermatological: negative for rash Respiratory: negative for cough, hemoptysis, or wheezing Urologic: negative for hematuria or dysuria Abdominal: negative for nausea, vomiting, diarrhea, bright red blood per rectum, melena, or hematemesis Neurologic: negative for visual changes, syncope, or dizziness Musculoskeletal: negative for back pain, joint pain, or swelling Psych: cooperative and appropriate All other systems reviewed and are otherwise negative except as noted above.  Physical Exam: Blood pressure 140/89, pulse 63, temperature 98 F (36.7 C), temperature source Oral, resp. rate 20, SpO2 99 %.  General appearance: alert, cooperative, no distress and mildly obese Neck: no carotid bruit and no JVD Lungs: clear to auscultation bilaterally Heart: regular rate and rhythm Abdomen: soft, non-tender; bowel sounds normal; no masses,  no organomegaly Extremities: extremities normal, atraumatic, no cyanosis or edema Pulses: 2+ and symmetric Skin: Skin color, texture, turgor normal. No rashes or lesions Neurologic: Grossly normal    Labs:   Results for orders placed or performed during the hospital encounter of 05/19/17 (from the past 24 hour(s))  Basic metabolic panel     Status: Abnormal   Collection Time: 05/19/17 12:34 PM  Result Value Ref Range   Sodium 140 135 - 145 mmol/L   Potassium 4.1 3.5 - 5.1 mmol/L   Chloride 107 101 - 111 mmol/L   CO2 25 22 - 32 mmol/L   Glucose, Bld 133 (H) 65 - 99 mg/dL   BUN 11 6 - 20 mg/dL   Creatinine, Ser 4.09 0.44 - 1.00 mg/dL   Calcium 9.3 8.9 - 81.1 mg/dL   GFR calc non Af Amer >60  >60 mL/min   GFR calc Af Amer >60 >60 mL/min   Anion gap 8 5 - 15  CBC     Status: Abnormal   Collection Time: 05/19/17 12:34 PM  Result Value Ref Range   WBC 7.6 4.0 - 10.5 K/uL   RBC 4.86 3.87 - 5.11 MIL/uL   Hemoglobin 15.5 (H) 12.0 - 15.0 g/dL   HCT 91.4 (H) 78.2 - 95.6 %   MCV 96.1 78.0 - 100.0 fL   MCH 31.9 26.0 - 34.0 pg   MCHC 33.2 30.0 - 36.0 g/dL   RDW 21.3 08.6 - 57.8 %   Platelets 216 150 - 400 K/uL  Lipase, blood     Status: None   Collection Time: 05/19/17 12:34 PM  Result Value Ref Range   Lipase 25 11 - 51 U/L  Hepatic function panel     Status: Abnormal   Collection Time: 05/19/17 12:34 PM  Result Value Ref Range   Total Protein 7.0 6.5 - 8.1 g/dL   Albumin 3.7 3.5 - 5.0 g/dL   AST 23 15 - 41 U/L   ALT 27 14 - 54 U/L   Alkaline Phosphatase 93 38 - 126 U/L   Total Bilirubin 0.6 0.3 - 1.2 mg/dL   Bilirubin, Direct <4.6 (L) 0.1 - 0.5 mg/dL   Indirect Bilirubin NOT CALCULATED 0.3 - 0.9 mg/dL  I-stat troponin, ED     Status: Abnormal   Collection Time: 05/19/17 12:42 PM  Result Value Ref Range   Troponin i, poc 0.09 (HH) 0.00 - 0.08 ng/mL   Comment NOTIFIED PHYSICIAN    Comment 3          I-stat troponin, ED     Status: Abnormal   Collection Time: 05/19/17  3:39 PM  Result Value Ref Range   Troponin i, poc 0.88 (HH) 0.00 - 0.08 ng/mL   Comment NOTIFIED PHYSICIAN    Comment 3             Radiology/Studies: Dg Chest 2 View  Result Date: 05/19/2017 CLINICAL DATA:  Epigastric abdominal pain beginning 2 days ago. History of COPD. EXAM: CHEST  2 VIEW COMPARISON:  None. FINDINGS: Normal cardiac silhouette and mediastinal contours. The lungs are hyperexpanded with flattening of the bilateral hemidiaphragms. No focal parenchymal opacities. No pleural effusion or pneumothorax. No evidence of edema. No acute osseous abnormalities. IMPRESSION: Hyperexpanded lungs without acute cardiopulmonary disease. Specifically, no focal airspace opacities to suggest pneumonia.  Electronically Signed   By: Simonne Come M.D.   On: 05/19/2017 12:51    EKG:NSR  ASSESSMENT AND PLAN:  Principal Problem:   1. Chest pain with moderate risk of acute coronary syndrome  Active Problems:   2. Troponin poc level elevated- awaiting Troponin-i   3. GERD- s/p recent endoscopy with biopsy (no dilatation)   4.  Smoker- 1ppd   5. COPD- inhaler prn   PLAN: Will add Nitro paste- PPI. Hold  on Heparin till Troponin-I is back. Plan diagnostic cath in AM.    Signed, Corine ShelterLuke Kilroy, PA-C 05/19/2017, 4:32 PM [phone]  Personally seen and examined. Agree with above.  61 year old female with history of epigastric discomfort for quite some time who underwent [place]GD with biopsy 2 days ago in [place], no esophageal dilatation performed, here with symptoms of both typical as well as atypical chest discomfort, pleuritic-like pain, worse with movement, possibly laying down or taking in a deep breath, chest x-ray normal with point-of-care troponin elevated at 0.09 and second one elevated at 0.88 with normal white count and symptoms of "food hanging up "in her distal esophagus then passing with some relief of discomfort.  She is alert, and oriented. She is tearful, does not appear to be in any acute distress. Her sister died 3 months ago because of cancer. I do not appreciate any cardiac rubs, normal respiratory effort, epigastric region is not significantly tender. No edema.  Elevated troponin  - I will hold off on starting heparin for given her recent esophageal or gastric biopsies.  - Could this be cardiac given her long-standing smoking history?  - Could this be inflammatory, pleuritic post biopsy?  - I have discussed the case with on-call GI for suggestions of further evaluation and we will order a CT scan of the chest to exclude any possible free air or other potential postprocedural findings, hematoma etc.  - I think it also makes sense for us to set her up for a cardiac  catheterization tomorrow given her elevated troponin with both typical as well as atypical symptoms. We went to exclude the possibility of significant CAD as the route for her underlying discomfort to begin with. Certainly however 2 days of continuous discomfort does not sound cardiac.  - If cardiac catheterization is normal and CT scan of chest is normal, suggestion is to consult GI tomorrow for further evaluation.  Donato SchultzMark Esmerelda Finnigan, MD

## 2017-05-19 NOTE — ED Notes (Signed)
MD made aware of critical troponin value 

## 2017-05-19 NOTE — ED Notes (Signed)
I Stat Trop I results shown to Dr. Ranae PalmsYelverton

## 2017-05-19 NOTE — ED Notes (Signed)
ED Provider at bedside. 

## 2017-05-19 NOTE — Progress Notes (Signed)
Pt complaining of 9/10 chest pain became diaphoretic. EKG obtain and 1 sublingual nitroglycerin given to patient. Pain reduced to a 3/10 within five minutes. Cardiology fellow Dr. Orson AloeHenderson notified who instructed to place nitroglycerine paste on patient. Will continue to monitor patient. Geronimo Bootownes, Kanai Hilger R, RN

## 2017-05-20 ENCOUNTER — Observation Stay (HOSPITAL_BASED_OUTPATIENT_CLINIC_OR_DEPARTMENT_OTHER): Payer: BLUE CROSS/BLUE SHIELD

## 2017-05-20 ENCOUNTER — Encounter (HOSPITAL_COMMUNITY): Payer: Self-pay | Admitting: Internal Medicine

## 2017-05-20 ENCOUNTER — Encounter (HOSPITAL_COMMUNITY)
Admission: EM | Disposition: A | Discharge status: Home / Self Care | Payer: Self-pay | Source: Home / Self Care | Attending: Thoracic Surgery (Cardiothoracic Vascular Surgery)

## 2017-05-20 DIAGNOSIS — I340 Nonrheumatic mitral (valve) insufficiency: Secondary | ICD-10-CM

## 2017-05-20 DIAGNOSIS — I214 Non-ST elevation (NSTEMI) myocardial infarction: Secondary | ICD-10-CM

## 2017-05-20 DIAGNOSIS — I2510 Atherosclerotic heart disease of native coronary artery without angina pectoris: Secondary | ICD-10-CM | POA: Diagnosis not present

## 2017-05-20 DIAGNOSIS — R748 Abnormal levels of other serum enzymes: Secondary | ICD-10-CM | POA: Diagnosis not present

## 2017-05-20 DIAGNOSIS — I200 Unstable angina: Secondary | ICD-10-CM

## 2017-05-20 DIAGNOSIS — R1013 Epigastric pain: Secondary | ICD-10-CM

## 2017-05-20 DIAGNOSIS — F17200 Nicotine dependence, unspecified, uncomplicated: Secondary | ICD-10-CM | POA: Diagnosis not present

## 2017-05-20 HISTORY — PX: INTRAVASCULAR PRESSURE WIRE/FFR STUDY: CATH118243

## 2017-05-20 HISTORY — DX: Non-ST elevation (NSTEMI) myocardial infarction: I21.4

## 2017-05-20 HISTORY — PX: LEFT HEART CATH AND CORONARY ANGIOGRAPHY: CATH118249

## 2017-05-20 LAB — LIPID PANEL
CHOLESTEROL: 141 mg/dL (ref 0–200)
HDL: 32 mg/dL — AB (ref >40)
LDL CALC: 87 mg/dL (ref 0–99)
TRIGLYCERIDES: 111 mg/dL (ref <150)
Total CHOL/HDL Ratio: 4.4 RATIO
VLDL: 22 mg/dL (ref 0–40)

## 2017-05-20 LAB — ECHOCARDIOGRAM COMPLETE
E decel time: 275 msec
E/e' ratio: 8.97
FS: 28 % (ref 28–44)
Height: 60 in
IVS/LV PW RATIO, ED: 0.92
LA ID, A-P, ES: 40 mm
LA diam end sys: 40 mm
LA diam index: 2.33 cm/m2
LA vol A4C: 49.3 ml
LA vol index: 25.7 mL/m2
LA vol: 44.2 mL
LV E/e' medial: 8.97
LV E/e'average: 8.97
LV PW d: 11.7 mm — AB (ref 0.6–1.1)
LV e' LATERAL: 7.51 cm/s
LVOT SV: 86 mL
LVOT VTI: 24.9 cm
LVOT area: 3.46 cm2
LVOT diameter: 21 mm
LVOT peak vel: 110 cm/s
Lateral S' vel: 13.4 cm/s
MV Dec: 275
MV pk A vel: 101 m/s
MV pk E vel: 67.4 m/s
PV Reg vel dias: 86.9 cm/s
TAPSE: 28.2 mm
TDI e' lateral: 7.51
TDI e' medial: 7.18
Weight: 2641.6 oz

## 2017-05-20 LAB — TROPONIN I
Troponin I: 0.2 ng/mL (ref <0.03)
Troponin I: 0.37 ng/mL (ref <0.03)

## 2017-05-20 LAB — POCT ACTIVATED CLOTTING TIME: Activated Clotting Time: 230 seconds

## 2017-05-20 LAB — HIV ANTIBODY (ROUTINE TESTING W REFLEX): HIV Screen 4th Generation wRfx: NONREACTIVE

## 2017-05-20 SURGERY — LEFT HEART CATH AND CORONARY ANGIOGRAPHY
Anesthesia: LOCAL

## 2017-05-20 MED ORDER — SODIUM CHLORIDE 0.9% FLUSH
3.0000 mL | Freq: Two times a day (BID) | INTRAVENOUS | Status: DC
  Administered 2017-05-21: 3 mL via INTRAVENOUS

## 2017-05-20 MED ORDER — HEPARIN (PORCINE) IN NACL 2-0.9 UNIT/ML-% IJ SOLN
INTRAMUSCULAR | Status: AC | PRN
  Administered 2017-05-20: 1000 mL via INTRA_ARTERIAL

## 2017-05-20 MED ORDER — HEPARIN SODIUM (PORCINE) 1000 UNIT/ML IJ SOLN
INTRAMUSCULAR | Status: DC | PRN
  Administered 2017-05-20: 4000 [IU] via INTRAVENOUS
  Administered 2017-05-20: 3000 [IU] via INTRAVENOUS
  Administered 2017-05-20: 2000 [IU] via INTRAVENOUS

## 2017-05-20 MED ORDER — HEPARIN (PORCINE) IN NACL 100-0.45 UNIT/ML-% IJ SOLN
900.0000 [IU]/h | INTRAMUSCULAR | Status: DC
  Administered 2017-05-20 – 2017-05-21 (×3): 900 [IU]/h via INTRAVENOUS
  Filled 2017-05-20 (×2): qty 250

## 2017-05-20 MED ORDER — NITROGLYCERIN 1 MG/10 ML FOR IR/CATH LAB
INTRA_ARTERIAL | Status: DC | PRN
  Administered 2017-05-20 (×2): 200 ug via INTRACORONARY

## 2017-05-20 MED ORDER — IOPAMIDOL (ISOVUE-370) INJECTION 76%
INTRAVENOUS | Status: DC | PRN
  Administered 2017-05-20: 100 mL via INTRA_ARTERIAL

## 2017-05-20 MED ORDER — LIDOCAINE HCL 1 % IJ SOLN
INTRAMUSCULAR | Status: AC
Start: 2017-05-20 — End: 2017-05-20
  Filled 2017-05-20: qty 20

## 2017-05-20 MED ORDER — NITROGLYCERIN 0.4 MG SL SUBL
SUBLINGUAL_TABLET | SUBLINGUAL | Status: AC
  Filled 2017-05-20: qty 1

## 2017-05-20 MED ORDER — METOPROLOL TARTRATE 12.5 MG HALF TABLET
12.5000 mg | ORAL_TABLET | Freq: Two times a day (BID) | ORAL | Status: DC
  Administered 2017-05-20 – 2017-05-21 (×4): 12.5 mg via ORAL
  Filled 2017-05-20 (×4): qty 1

## 2017-05-20 MED ORDER — ADENOSINE 12 MG/4ML IV SOLN
INTRAVENOUS | Status: AC
  Filled 2017-05-20: qty 16

## 2017-05-20 MED ORDER — NITROGLYCERIN 1 MG/10 ML FOR IR/CATH LAB
INTRA_ARTERIAL | Status: AC
  Filled 2017-05-20: qty 10

## 2017-05-20 MED ORDER — FENTANYL CITRATE (PF) 100 MCG/2ML IJ SOLN
INTRAMUSCULAR | Status: DC | PRN
  Administered 2017-05-20 (×2): 25 ug via INTRAVENOUS

## 2017-05-20 MED ORDER — NITROGLYCERIN 0.4 MG SL SUBL: 0.4000 mg | SUBLINGUAL_TABLET | Freq: Once | SUBLINGUAL | Status: DC

## 2017-05-20 MED ORDER — IOPAMIDOL (ISOVUE-370) INJECTION 76%
INTRAVENOUS | Status: AC
  Filled 2017-05-20: qty 100

## 2017-05-20 MED ORDER — NITROGLYCERIN IN D5W 200-5 MCG/ML-% IV SOLN
5.0000 ug/min | INTRAVENOUS | Status: DC
  Administered 2017-05-20: 5 ug via INTRAVENOUS

## 2017-05-20 MED ORDER — ISOSORBIDE MONONITRATE ER 30 MG PO TB24
30.0000 mg | ORAL_TABLET | Freq: Every day | ORAL | Status: DC
  Administered 2017-05-20: 30 mg via ORAL
  Filled 2017-05-20: qty 1

## 2017-05-20 MED ORDER — SODIUM CHLORIDE 0.9 % IV SOLN: INTRAVENOUS | Status: AC

## 2017-05-20 MED ORDER — SODIUM CHLORIDE 0.9 % IV SOLN: 250.0000 mL | INTRAVENOUS | Status: DC | PRN

## 2017-05-20 MED ORDER — LIDOCAINE HCL (PF) 1 % IJ SOLN
INTRAMUSCULAR | Status: DC | PRN
  Administered 2017-05-20: 2 mL

## 2017-05-20 MED ORDER — VERAPAMIL HCL 2.5 MG/ML IV SOLN
INTRAVENOUS | Status: AC
  Filled 2017-05-20: qty 2

## 2017-05-20 MED ORDER — NITROGLYCERIN IN D5W 200-5 MCG/ML-% IV SOLN
INTRAVENOUS | Status: AC
  Administered 2017-05-20: 5 ug via INTRAVENOUS
  Filled 2017-05-20: qty 250

## 2017-05-20 MED ORDER — HEPARIN SODIUM (PORCINE) 1000 UNIT/ML IJ SOLN
INTRAMUSCULAR | Status: AC
  Filled 2017-05-20: qty 1

## 2017-05-20 MED ORDER — TRAMADOL HCL 50 MG PO TABS
50.0000 mg | ORAL_TABLET | Freq: Four times a day (QID) | ORAL | Status: DC | PRN
  Administered 2017-05-20 – 2017-05-21 (×5): 50 mg via ORAL
  Filled 2017-05-20 (×5): qty 1

## 2017-05-20 MED ORDER — SODIUM CHLORIDE 0.9% FLUSH: 3.0000 mL | INTRAVENOUS | Status: DC | PRN

## 2017-05-20 MED ORDER — ISOSORBIDE MONONITRATE ER 30 MG PO TB24: 15.0000 mg | ORAL_TABLET | Freq: Every day | ORAL | Status: DC

## 2017-05-20 MED ORDER — VERAPAMIL HCL 2.5 MG/ML IV SOLN
INTRAVENOUS | Status: DC | PRN
  Administered 2017-05-20: 09:00:00 via INTRA_ARTERIAL

## 2017-05-20 MED ORDER — MIDAZOLAM HCL 2 MG/2ML IJ SOLN
INTRAMUSCULAR | Status: AC
  Filled 2017-05-20: qty 2

## 2017-05-20 MED ORDER — FENTANYL CITRATE (PF) 100 MCG/2ML IJ SOLN
INTRAMUSCULAR | Status: AC
  Filled 2017-05-20: qty 2

## 2017-05-20 MED ORDER — HEPARIN (PORCINE) IN NACL 2-0.9 UNIT/ML-% IJ SOLN
INTRAMUSCULAR | Status: AC
  Filled 2017-05-20: qty 1000

## 2017-05-20 MED ORDER — VENLAFAXINE HCL ER 75 MG PO CP24
75.0000 mg | ORAL_CAPSULE | Freq: Every day | ORAL | Status: DC
  Administered 2017-05-20 – 2017-05-21 (×2): 75 mg via ORAL
  Filled 2017-05-20 (×3): qty 1

## 2017-05-20 MED ORDER — MIDAZOLAM HCL 2 MG/2ML IJ SOLN
INTRAMUSCULAR | Status: DC | PRN
  Administered 2017-05-20: 1 mg via INTRAVENOUS

## 2017-05-20 MED ORDER — ADENOSINE (DIAGNOSTIC) 140MCG/KG/MIN
INTRAVENOUS | Status: AC | PRN
  Administered 2017-05-20: 140 ug/kg/min via INTRAVENOUS

## 2017-05-20 MED ORDER — ATORVASTATIN CALCIUM 80 MG PO TABS
80.0000 mg | ORAL_TABLET | Freq: Every day | ORAL | Status: DC
  Administered 2017-05-20 – 2017-05-21 (×2): 80 mg via ORAL
  Filled 2017-05-20 (×2): qty 1

## 2017-05-20 SURGICAL SUPPLY — 13 items
CATH IMPULSE 5F ANG/FL3.5 (CATHETERS) ×2 IMPLANT
CATH LAUNCHER 5F EBU3.5 (CATHETERS) ×2 IMPLANT
DEVICE RAD COMP TR BAND LRG (VASCULAR PRODUCTS) ×2 IMPLANT
GLIDESHEATH SLEND SS 6F .021 (SHEATH) ×2 IMPLANT
GUIDEWIRE INQWIRE 1.5J.035X260 (WIRE) ×1 IMPLANT
GUIDEWIRE PRESSURE COMET II (WIRE) ×2 IMPLANT
INQWIRE 1.5J .035X260CM (WIRE) ×2
KIT ESSENTIALS PG (KITS) ×2 IMPLANT
KIT HEART LEFT (KITS) ×2 IMPLANT
PACK CARDIAC CATHETERIZATION (CUSTOM PROCEDURE TRAY) ×2 IMPLANT
SYR MEDRAD MARK V 150ML (SYRINGE) ×2 IMPLANT
TRANSDUCER W/STOPCOCK (MISCELLANEOUS) ×2 IMPLANT
TUBING CIL FLEX 10 FLL-RA (TUBING) ×2 IMPLANT

## 2017-05-20 NOTE — Progress Notes (Signed)
Patient having medical testing at the time of this visit to provide documentation for an Advance Directive. Chaplain follow up will done at a later time. Chaplain Janell QuietAudrey Rai Severns [phone]

## 2017-05-20 NOTE — Progress Notes (Signed)
  Echocardiogram 2D Echocardiogram has been performed.  Sharon Cochran Bernell Sigal 05/20/2017, 4:01 PM

## 2017-05-20 NOTE — Interval H&P Note (Signed)
History and Physical Interval Note:  05/20/2017 8:51 AM  Sharon Cochran  has presented today for cardiac catheterization, with the diagnosis of NSTEMI  The various methods of treatment have been discussed with the patient and family. After consideration of risks, benefits and other options for treatment, the patient has consented to  Procedure(s): Left Heart Cath and Coronary Angiography (N/A) as a surgical intervention .  The patient's history has been reviewed, patient examined, no change in status, stable for surgery.  I have reviewed the patient's chart and labs.  Questions were answered to the patient's satisfaction.    Cath Lab Visit (complete for each Cath Lab visit)  Clinical Evaluation Leading to the Procedure:   ACS: Yes.    Non-ACS:  N/A  [name]

## 2017-05-20 NOTE — Progress Notes (Signed)
ANTICOAGULATION CONSULT NOTE - Initial Consult  Pharmacy Consult for Heparin Indication: atrial fibrillation  No Known Allergies  Patient Measurements: Height: 5' (152.4 cm) Weight: 165 lb 1.6 oz (74.9 kg) IBW/kg (Calculated) : 45.5 Heparin Dosing Weight: 62.5 kg  Vital Signs: Temp: 98.5 F (36.9 C) (06/04 1434) Temp Source: Oral (06/04 1434) BP: 112/72 (06/04 1434) Pulse Rate: 70 (06/04 1111)  Labs:  Recent Labs  05/19/17 1234  05/19/17 1818 05/19/17 2347 05/20/17 0557  HGB 15.5*  --  14.8  14.6  --   --   HCT 46.7*  --  44.9  44.1  --   --   PLT 216  --  229  216  --   --   LABPROT  --   --  12.7  --   --   INR  --   --  0.95  --   --   CREATININE 0.57  --  0.58  0.57  --   --   TROPONINI  --   < > 0.80* 0.37* 0.20*  < > = values in this interval not displayed.  Estimated Creatinine Clearance: 68.5 mL/min (by C-G formula based on SCr of 0.58 mg/dL).   Medical History: Past Medical History:  Diagnosis Date  . COPD (chronic obstructive pulmonary disease) (HCC)   . GERD (gastroesophageal reflux disease)     Medications:  Prescriptions Prior to Admission  Medication Sig Dispense Refill Last Dose  . albuterol (PROVENTIL HFA;VENTOLIN HFA) 108 (90 Base) MCG/ACT inhaler Inhale 1-2 puffs into the lungs every 6 (six) hours as needed for wheezing or shortness of breath.   Past Month at Unknown time  . albuterol (PROVENTIL) (2.5 MG/3ML) 0.083% nebulizer solution Take 2.5 mg by nebulization every 6 (six) hours as needed for wheezing or shortness of breath.    at prn  . diclofenac (CATAFLAM) 50 MG tablet Take 50 mg by mouth 2 (two) times daily.  0 05/19/2017 at Unknown time  . Fluticasone-Salmeterol (ADVAIR) 250-50 MCG/DOSE AEPB Inhale 1 puff into the lungs 2 (two) times daily.   05/19/2017 at Unknown time  . metroNIDAZOLE (METROGEL) 1 % gel Apply 1 application topically at bedtime.   05/18/2017 at Unknown time  . omeprazole (PRILOSEC) 20 MG capsule Take 20 mg by mouth daily.      Marland Kitchen venlafaxine XR (EFFEXOR-XR) 75 MG 24 hr capsule Take 75 mg by mouth daily with breakfast.   05/19/2017 at Unknown time  . traZODone (DESYREL) 50 MG tablet Take 50 mg by mouth at bedtime.   Not Taking at Unknown time   Scheduled:  . nitroGLYCERIN      . aspirin  324 mg Oral Once  . aspirin  81 mg Oral Daily  . atorvastatin  80 mg Oral q1800  . [START ON 05/21/2017] isosorbide mononitrate  15 mg Oral Daily  . metoprolol tartrate  12.5 mg Oral BID  . nicotine  14 mg Transdermal Daily  . pantoprazole  40 mg Oral Q0600  . sodium chloride flush  3 mL Intravenous Q12H  . venlafaxine XR  75 mg Oral Q breakfast   Infusions:  . sodium chloride    . sodium chloride      Assessment: 61yo female with heart cath today has recurring chest pain. Pharmacy is consulted to dose heparin for ACS/chest pain. Patient received heparin 2000 unit bolus prior to cath and was on 900 units/hr. Will restart with no bolus.  Goal of Therapy:  Heparin level 0.3-0.7 units/ml Monitor platelets  by anticoagulation protocol: Yes   Plan:  Start heparin infusion at 900 units/hr Check anti-Xa level in 6 hours and daily while on heparin Continue to monitor H&H and platelets  Arlean Hoppingorey M. Newman PiesBall, PharmD, BCPS Clinical Pharmacist [phone] 05/20/2017,7:29 PM

## 2017-05-20 NOTE — Progress Notes (Signed)
Pt with headache all day with imdur.  I decreased dose, though may need to be stopped in AM and added ultram prn as tylenol did not help.

## 2017-05-20 NOTE — Progress Notes (Signed)
Pt complaining of 6/10 chest pain and became diaphoretic. EKG done and 2 Nitroglycerinn given. Pain reduced to 2/10. Pt still has nitro paste in place. Will continue to monitor.

## 2017-05-20 NOTE — Progress Notes (Addendum)
Progress Note  Patient Name: Sharon Cochran Date of Encounter: 05/20/2017  Primary Cardiologist:  Anne FuSkains  Subjective   Being seen after cardiac catheterization.  Inpatient Medications    Scheduled Meds: . aspirin  324 mg Oral Once  . aspirin  81 mg Oral Daily  . atorvastatin  80 mg Oral q1800  . heparin  5,000 Units Subcutaneous Q8H  . metoprolol tartrate  12.5 mg Oral BID  . nicotine  14 mg Transdermal Daily  . nitroGLYCERIN  0.5 inch Topical Q6H  . pantoprazole  40 mg Oral Q0600  . sodium chloride flush  3 mL Intravenous Q12H  . venlafaxine XR  75 mg Oral Q breakfast   Continuous Infusions: . sodium chloride    . sodium chloride    . sodium chloride     PRN Meds: sodium chloride, sodium chloride, acetaminophen, ALPRAZolam, gi cocktail, ondansetron (ZOFRAN) IV, sodium chloride flush, zolpidem   Vital Signs    Vitals:   05/20/17 1041 05/20/17 1044 05/20/17 1100 05/20/17 1111  BP: 135/81  121/76 140/79  Pulse: 73 71 76 70  Resp:      Temp:      TempSrc:      SpO2: 96%  96% 94%  Weight:      Height:        Intake/Output Summary (Last 24 hours) at 05/20/17 1159 Last data filed at 05/20/17 0804  Gross per 24 hour  Intake            462.3 ml  Output                0 ml  Net            462.3 ml   Filed Weights   05/19/17 1800 05/20/17 0419  Weight: 163 lb 4.8 oz (74.1 kg) 165 lb 1.6 oz (74.9 kg)    Telemetry    Normal sinus rhythm - Personally Reviewed  ECG    Normal sinus rhythm with nonspecific T-wave abnormality - Personally Reviewed  Physical Exam  No acute distress GEN: No acute distress.   Neck: No JVD Cardiac: RRR, no murmurs, rubs, or gallops.  Respiratory: Clear to auscultation bilaterally. GI: Soft, nontender, non-distended  MS: No edema; No deformity. Neuro:  Nonfocal  Psych: Normal affect   Labs    Chemistry Recent Labs Lab 05/19/17 1234 05/19/17 1818  NA 140 139  K 4.1 3.8  CL 107 106  CO2 25 26  GLUCOSE 133* 101*   BUN 11 10  CREATININE 0.57 0.58  0.57  CALCIUM 9.3 9.0  PROT 7.0  --   ALBUMIN 3.7  --   AST 23  --   ALT 27  --   ALKPHOS 93  --   BILITOT 0.6  --   GFRNONAA >60 >60  >60  GFRAA >60 >60  >60  ANIONGAP 8 7     Hematology Recent Labs Lab 05/19/17 1234 05/19/17 1818  WBC 7.6 9.4  9.0  RBC 4.86 4.71  4.62  HGB 15.5* 14.8  14.6  HCT 46.7* 44.9  44.1  MCV 96.1 95.3  95.5  MCH 31.9 31.4  31.6  MCHC 33.2 33.0  33.1  RDW 13.1 13.0  13.1  PLT 216 229  216    Cardiac Enzymes Recent Labs Lab 05/19/17 1620 05/19/17 1818 05/19/17 2347 05/20/17 0557  TROPONINI 0.92* 0.80* 0.37* 0.20*    Recent Labs Lab 05/19/17 1242 05/19/17 1539  TROPIPOC 0.09* 0.88*  BNPNo results for input(s): BNP, PROBNP in the last 168 hours.   DDimer No results for input(s): DDIMER in the last 168 hours.   Radiology    Dg Chest 2 View  Result Date: 05/19/2017 CLINICAL DATA:  Epigastric abdominal pain beginning 2 days ago. History of COPD. EXAM: CHEST  2 VIEW COMPARISON:  None. FINDINGS: Normal cardiac silhouette and mediastinal contours. The lungs are hyperexpanded with flattening of the bilateral hemidiaphragms. No focal parenchymal opacities. No pleural effusion or pneumothorax. No evidence of edema. No acute osseous abnormalities. IMPRESSION: Hyperexpanded lungs without acute cardiopulmonary disease. Specifically, no focal airspace opacities to suggest pneumonia. Electronically Signed   By: Simonne Come M.D.   On: 05/19/2017 12:51   Ct Chest W Contrast  Result Date: 05/19/2017 CLINICAL DATA:  Epigastric pain 72 hours after endoscopy. EXAM: CT CHEST WITHOUT AND WITH CONTRAST TECHNIQUE: Protocol was requested by GI. A without chest CT was obtained. Subsequently Multidetector CT imaging of the chest was performed during intravenous and oral contrast administration. CONTRAST:  75mL ISOVUE-300 IOPAMIDOL (ISOVUE-300) INJECTION 61%IV COMPARISON:  None. FINDINGS: Cardiovascular: Normal heart  size. No pericardial effusion. No evidence of acute vascular disease. Mild atherosclerotic calcifications seen along the distal brachiocephalic. Mediastinum/Nodes: No pneumomediastinum, esophageal thickening, or extravasation of the oral contrast which is seen throughout the esophagus. Negative for adenopathy or swelling. Lungs/Pleura: Negative for pneumonia or air leak. Upper Abdomen: The stomach was covered and is negative for wall thickening. No epigastric pneumoperitoneum. Musculoskeletal: No acute or aggressive finding. Probable dermal inclusion cyst in the subcutaneous right paramedian lower back. IMPRESSION: No acute finding. There is no pneumomediastinum or extravasation of esophageal contrast. The stomach was visualized and negative. Electronically Signed   By: Marnee Spring M.D.   On: 05/19/2017 20:40    Cardiac Studies   Coronary Angiography 05/20/17: Coronary Diagrams   Diagnostic Diagram         Conclusions: 1. 60% ostial LAD stenosis that is not hemodynamically significant by FFR (0.88). Mild disease also noted in the distal LMCA, LCx, and RCA. 2. Focal mid inferior akinesis with otherwise preserved LV contraction. No culprit lesion to explain wall motion abnormality. Takotsubo variant would be a consideration. 3. Mildly elevated left ventricular filling pressure.  Recommendations: 1. Aggressive medical therapy, including addition of high-intensity statin therapy and beta-blockade. 2. Proceed with echo to confirm inferior wall-motion abnormality.    Patient Profile     61 y.o. female with epigastric pain developing after upper GI endoscopy on 05/17/17. Cardiac cath reveals nonobstructive CAD. Focal inferior wall motion abnormality. Concern for possible atypical Takotsubo variant, CAS, and SCAD.  Assessment & Plan    1. Non-ST elevation myocardial infarction possibly related to stress cardiomyopathy variant(based on location of WMA, CAS (ostial LAD - FFR .88), and possibly  apical SCAD. Very confusing data set. Plan DC heparin. 2. History of gastroesophageal reflux disease 3. Tobacco abuse with developing COPD requiring inhalers therapy   Plan risk factor mitigation including the addition of statin therapy, low-dose aspirin, and smoking cessation.   Signed, Lesleigh Noe, MD  05/20/2017, 11:59 AM

## 2017-05-20 NOTE — Progress Notes (Addendum)
CARDIOLOGY NOTE  I was called because the patient is having her third severe episode of chest pressure since the heart catheterization. The first episode occurred during the echocardiogram. The second occurred with ambulation. The most recent episode occurred when talking to me concerning the findings that we have obtained today. Sublingual nitroglycerin was required. ECG with new inferior T-wave inversion, possibly related to hyperventilation. NTG causes severe CP.  BP 135/80 mmHg, HR 80 bpm. S4 gallop during pain.  Coronary images again reviewed and discussed with the patinet and family. LAS ostial lesion 80-90%. Despite the ostial LAD FFR of 0.88, the clinical pattern is that of unstable angina. FFR can be inaccurate in the setting of ACS. Echo "without RWMA", however there is focal mid inferior thinning and hypokinesis in same area as LVG.  CAD with unstable angina/NSTEMI  Plan PCI ostial LAD versus CABG with LIMA to LAD (best option). I also note that the distal LAD has an appearance suggesting SCAD. IV NTG at 5 micrograms/min. TCTS notified tonight - Dr. Dorris FetchHendrickson. IV heparin.  Critical care time 50 minutes

## 2017-05-21 ENCOUNTER — Observation Stay (HOSPITAL_COMMUNITY): Payer: BLUE CROSS/BLUE SHIELD

## 2017-05-21 ENCOUNTER — Observation Stay (HOSPITAL_BASED_OUTPATIENT_CLINIC_OR_DEPARTMENT_OTHER): Payer: BLUE CROSS/BLUE SHIELD

## 2017-05-21 ENCOUNTER — Other Ambulatory Visit: Payer: Self-pay | Admitting: *Deleted

## 2017-05-21 DIAGNOSIS — Z79899 Other long term (current) drug therapy: Secondary | ICD-10-CM | POA: Diagnosis not present

## 2017-05-21 DIAGNOSIS — J440 Chronic obstructive pulmonary disease with acute lower respiratory infection: Secondary | ICD-10-CM | POA: Diagnosis present

## 2017-05-21 DIAGNOSIS — E8770 Fluid overload, unspecified: Secondary | ICD-10-CM | POA: Diagnosis not present

## 2017-05-21 DIAGNOSIS — I252 Old myocardial infarction: Secondary | ICD-10-CM | POA: Diagnosis not present

## 2017-05-21 DIAGNOSIS — I214 Non-ST elevation (NSTEMI) myocardial infarction: Secondary | ICD-10-CM | POA: Diagnosis present

## 2017-05-21 DIAGNOSIS — E782 Mixed hyperlipidemia: Secondary | ICD-10-CM | POA: Diagnosis not present

## 2017-05-21 DIAGNOSIS — I2510 Atherosclerotic heart disease of native coronary artery without angina pectoris: Secondary | ICD-10-CM

## 2017-05-21 DIAGNOSIS — J189 Pneumonia, unspecified organism: Secondary | ICD-10-CM | POA: Diagnosis not present

## 2017-05-21 DIAGNOSIS — Z6833 Body mass index (BMI) 33.0-33.9, adult: Secondary | ICD-10-CM | POA: Diagnosis not present

## 2017-05-21 DIAGNOSIS — J9811 Atelectasis: Secondary | ICD-10-CM | POA: Diagnosis not present

## 2017-05-21 DIAGNOSIS — Z01810 Encounter for preprocedural cardiovascular examination: Secondary | ICD-10-CM

## 2017-05-21 DIAGNOSIS — I25110 Atherosclerotic heart disease of native coronary artery with unstable angina pectoris: Secondary | ICD-10-CM | POA: Diagnosis present

## 2017-05-21 DIAGNOSIS — F17210 Nicotine dependence, cigarettes, uncomplicated: Secondary | ICD-10-CM | POA: Diagnosis present

## 2017-05-21 DIAGNOSIS — J90 Pleural effusion, not elsewhere classified: Secondary | ICD-10-CM | POA: Diagnosis not present

## 2017-05-21 DIAGNOSIS — R1013 Epigastric pain: Secondary | ICD-10-CM | POA: Diagnosis present

## 2017-05-21 DIAGNOSIS — K5900 Constipation, unspecified: Secondary | ICD-10-CM | POA: Diagnosis not present

## 2017-05-21 DIAGNOSIS — K219 Gastro-esophageal reflux disease without esophagitis: Secondary | ICD-10-CM | POA: Diagnosis present

## 2017-05-21 DIAGNOSIS — F17200 Nicotine dependence, unspecified, uncomplicated: Secondary | ICD-10-CM | POA: Diagnosis not present

## 2017-05-21 DIAGNOSIS — Z951 Presence of aortocoronary bypass graft: Secondary | ICD-10-CM | POA: Diagnosis not present

## 2017-05-21 LAB — HEPARIN LEVEL (UNFRACTIONATED)
HEPARIN UNFRACTIONATED: 0.5 [IU]/mL (ref 0.30–0.70)
Heparin Unfractionated: 0.45 IU/mL (ref 0.30–0.70)

## 2017-05-21 LAB — VAS US DOPPLER PRE CABG
LCCADSYS: -82 cm/s
LCCAPDIAS: 27 cm/s
LEFT ECA DIAS: -24 cm/s
LEFT VERTEBRAL DIAS: 32 cm/s
Left CCA dist dias: -31 cm/s
Left CCA prox sys: 91 cm/s
Left ICA dist dias: -40 cm/s
Left ICA dist sys: -92 cm/s
Left ICA prox dias: -14 cm/s
Left ICA prox sys: -40 cm/s
RCCADSYS: -90 cm/s
RCCAPDIAS: 14 cm/s
RCCAPSYS: 62 cm/s
RIGHT ECA DIAS: -13 cm/s
RIGHT VERTEBRAL DIAS: 18 cm/s

## 2017-05-21 LAB — CBC
HEMATOCRIT: 40.1 % (ref 36.0–46.0)
HEMOGLOBIN: 13 g/dL (ref 12.0–15.0)
MCH: 31 pg (ref 26.0–34.0)
MCHC: 32.4 g/dL (ref 30.0–36.0)
MCV: 95.7 fL (ref 78.0–100.0)
Platelets: 195 10*3/uL (ref 150–400)
RBC: 4.19 MIL/uL (ref 3.87–5.11)
RDW: 12.9 % (ref 11.5–15.5)
WBC: 9.4 10*3/uL (ref 4.0–10.5)

## 2017-05-21 LAB — PULMONARY FUNCTION TEST
FEF 25-75 PRE: 1.47 L/s
FEF 25-75 Post: 1.29 L/sec
FEF2575-%CHANGE-POST: -12 %
FEF2575-%PRED-POST: 60 %
FEF2575-%PRED-PRE: 68 %
FEV1-%CHANGE-POST: -2 %
FEV1-%PRED-POST: 73 %
FEV1-%Pred-Pre: 75 %
FEV1-Post: 1.61 L
FEV1-Pre: 1.65 L
FEV1FVC-%Change-Post: 3 %
FEV1FVC-%PRED-PRE: 98 %
FEV6-%CHANGE-POST: -5 %
FEV6-%PRED-POST: 73 %
FEV6-%Pred-Pre: 78 %
FEV6-Post: 2.01 L
FEV6-Pre: 2.14 L
FEV6FVC-%Pred-Post: 104 %
FEV6FVC-%Pred-Pre: 104 %
FVC-%Change-Post: -5 %
FVC-%Pred-Post: 70 %
FVC-%Pred-Pre: 75 %
FVC-Post: 2.01 L
FVC-Pre: 2.14 L
POST FEV1/FVC RATIO: 80 %
PRE FEV6/FVC RATIO: 100 %
Post FEV6/FVC ratio: 100 %
Pre FEV1/FVC ratio: 77 %

## 2017-05-21 LAB — TYPE AND SCREEN
ABO/RH(D): A POS
Antibody Screen: NEGATIVE

## 2017-05-21 LAB — URINALYSIS, ROUTINE W REFLEX MICROSCOPIC
Bacteria, UA: NONE SEEN
Bilirubin Urine: NEGATIVE
GLUCOSE, UA: NEGATIVE mg/dL
Ketones, ur: NEGATIVE mg/dL
Leukocytes, UA: NEGATIVE
Nitrite: NEGATIVE
PROTEIN: NEGATIVE mg/dL
Specific Gravity, Urine: 1.017 (ref 1.005–1.030)
pH: 6 (ref 5.0–8.0)

## 2017-05-21 LAB — APTT: APTT: 87 s — AB (ref 24–36)

## 2017-05-21 LAB — ABO/RH: ABO/RH(D): A POS

## 2017-05-21 MED ORDER — MAGNESIUM SULFATE 50 % IJ SOLN
40.0000 meq | INTRAMUSCULAR | Status: DC
Start: 2017-05-22 — End: 2017-05-22
  Filled 2017-05-21: qty 10

## 2017-05-21 MED ORDER — TRANEXAMIC ACID 1000 MG/10ML IV SOLN
1.5000 mg/kg/h | INTRAVENOUS | Status: DC
  Filled 2017-05-21: qty 25

## 2017-05-21 MED ORDER — DEXMEDETOMIDINE HCL IN NACL 400 MCG/100ML IV SOLN
0.1000 ug/kg/h | INTRAVENOUS | Status: AC
Start: 2017-05-22 — End: 2017-05-22
  Administered 2017-05-22: 0.7 ug/kg/h via INTRAVENOUS
  Filled 2017-05-21: qty 100

## 2017-05-21 MED ORDER — BISACODYL 5 MG PO TBEC
5.0000 mg | DELAYED_RELEASE_TABLET | Freq: Once | ORAL | Status: AC
  Administered 2017-05-21: 5 mg via ORAL
  Filled 2017-05-21: qty 1

## 2017-05-21 MED ORDER — POTASSIUM CHLORIDE 2 MEQ/ML IV SOLN
80.0000 meq | INTRAVENOUS | Status: DC
Start: 2017-05-22 — End: 2017-05-22
  Filled 2017-05-21: qty 40

## 2017-05-21 MED ORDER — PLASMA-LYTE 148 IV SOLN
INTRAVENOUS | Status: AC
  Administered 2017-05-22: 500 mL
  Filled 2017-05-21: qty 2.5

## 2017-05-21 MED ORDER — TRANEXAMIC ACID (OHS) PUMP PRIME SOLUTION
2.0000 mg/kg | INTRAVENOUS | Status: DC
  Filled 2017-05-21: qty 1.49

## 2017-05-21 MED ORDER — CEFUROXIME SODIUM 750 MG IJ SOLR
750.0000 mg | INTRAMUSCULAR | Status: DC
  Filled 2017-05-21: qty 750

## 2017-05-21 MED ORDER — DIAZEPAM 5 MG PO TABS
5.0000 mg | ORAL_TABLET | Freq: Once | ORAL | Status: AC
  Administered 2017-05-22: 5 mg via ORAL
  Filled 2017-05-21: qty 1

## 2017-05-21 MED ORDER — DOPAMINE-DEXTROSE 3.2-5 MG/ML-% IV SOLN
0.0000 ug/kg/min | INTRAVENOUS | Status: DC
Start: 2017-05-22 — End: 2017-05-22
  Filled 2017-05-21: qty 250

## 2017-05-21 MED ORDER — CHLORHEXIDINE GLUCONATE CLOTH 2 % EX PADS
6.0000 | MEDICATED_PAD | Freq: Once | CUTANEOUS | Status: AC
  Administered 2017-05-22: 6 via TOPICAL

## 2017-05-21 MED ORDER — CHLORHEXIDINE GLUCONATE CLOTH 2 % EX PADS
6.0000 | MEDICATED_PAD | Freq: Once | CUTANEOUS | Status: AC
  Administered 2017-05-21: 6 via TOPICAL

## 2017-05-21 MED ORDER — HEPARIN SODIUM (PORCINE) 1000 UNIT/ML IJ SOLN
INTRAMUSCULAR | Status: DC
  Filled 2017-05-21: qty 30

## 2017-05-21 MED ORDER — EPINEPHRINE PF 1 MG/ML IJ SOLN
0.0000 ug/min | INTRAMUSCULAR | Status: DC
  Filled 2017-05-21: qty 4

## 2017-05-21 MED ORDER — TRANEXAMIC ACID (OHS) BOLUS VIA INFUSION
15.0000 mg/kg | INTRAVENOUS | Status: DC
  Filled 2017-05-21: qty 1118

## 2017-05-21 MED ORDER — SODIUM CHLORIDE 0.9 % IV SOLN
30.0000 ug/min | INTRAVENOUS | Status: AC
  Administered 2017-05-22: 25 ug/min via INTRAVENOUS
  Filled 2017-05-21: qty 2

## 2017-05-21 MED ORDER — CHLORHEXIDINE GLUCONATE 0.12 % MT SOLN
15.0000 mL | Freq: Once | OROMUCOSAL | Status: AC
  Administered 2017-05-22: 15 mL via OROMUCOSAL
  Filled 2017-05-21: qty 15

## 2017-05-21 MED ORDER — ALBUTEROL SULFATE (2.5 MG/3ML) 0.083% IN NEBU
2.5000 mg | INHALATION_SOLUTION | Freq: Once | RESPIRATORY_TRACT | Status: AC
Start: 2017-05-21 — End: 2017-05-21
  Administered 2017-05-21: 2.5 mg via RESPIRATORY_TRACT

## 2017-05-21 MED ORDER — SODIUM CHLORIDE 0.9 % IV SOLN
INTRAVENOUS | Status: AC
  Administered 2017-05-22: .9 [IU]/h via INTRAVENOUS
  Filled 2017-05-21: qty 1

## 2017-05-21 MED ORDER — METOPROLOL TARTRATE 12.5 MG HALF TABLET
12.5000 mg | ORAL_TABLET | Freq: Once | ORAL | Status: AC
  Administered 2017-05-22: 12.5 mg via ORAL
  Filled 2017-05-21: qty 1

## 2017-05-21 MED ORDER — CEFUROXIME SODIUM 1.5 G IV SOLR
1.5000 g | INTRAVENOUS | Status: DC
  Filled 2017-05-21: qty 1.5

## 2017-05-21 MED ORDER — NITROGLYCERIN IN D5W 200-5 MCG/ML-% IV SOLN
2.0000 ug/min | INTRAVENOUS | Status: DC
  Filled 2017-05-21: qty 250

## 2017-05-21 MED ORDER — SODIUM CHLORIDE 0.9 % IV SOLN
1250.0000 mg | INTRAVENOUS | Status: AC
  Administered 2017-05-22: 1250 mg via INTRAVENOUS
  Filled 2017-05-21: qty 1250

## 2017-05-21 NOTE — Consult Note (Signed)
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 [name] is an 61 y.o. female.  HPI: 61 yo woman presented with a cc/o CP  Sharon Cochran is a 61 yo woman with a 40 pack year history of tobacco abuse and COPD. She has been experiencing epigastric/ substernal chest discomfort for the past several weeks. On Friday 62 she had an endoscopy with biopsy in [place] ([place]). She came to [facility] for a great nephew's graduation. On Saturday she was experiencing a good deal of pain, which she described as a squeezing dull pressure sensation that made it difficult for her to breathe. She thought it might be related to her endoscopy. On Sunday at church she developed severe pain and had to go outside. The pain was unrelenting and she was brought to [facility] emergency room. Her initial troponin was 0.09 but it quickly rose to 0.92. She underwent cardiac catheterization yesterday which revealed an 80-90% ostial LAD stenosis. An FFR was performed and was 0.88. It was felt that she might be able to be managed medically. However she had 3 additional episodes of chest pressure post catheterization and had be placed back on intravenous heparin and nitroglycerin. She currently complains of a vague tightness but not the pressure that she was experiencing yesterday.  Past Medical History:  Diagnosis Date  . COPD (chronic obstructive pulmonary disease) ([place])   . GERD (gastroesophageal reflux disease)     Past Surgical History:  Procedure Laterality Date  . INTRAVASCULAR PRESSURE WIRE/FFR STUDY N/A 05/20/2017   Procedure: Intravascular Pressure Wire/FFR Study;  Surgeon: Nelva Bush, MD;  Location: [place] [facility];  Service: Cardiovascular;  Laterality: N/A;  . LEFT HEART CATH AND CORONARY ANGIOGRAPHY N/A 05/20/2017   Procedure: Left Heart Cath and Coronary Angiography;  Surgeon: Nelva Bush, MD;  Location: [place] [facility];   Service: Cardiovascular;  Laterality: N/A;  . TUBAL LIGATION      History reviewed. No pertinent family history.  Social History:  reports that she has been smoking Cigarettes.  She has been smoking about 1.00 pack per day. She has never used smokeless tobacco. She reports that she drinks about 2.4 oz of alcohol per week . She reports that she does not use drugs.  Allergies: No Known Allergies  Medications:  Prior to Admission:  Prescriptions Prior to Admission  Medication Sig Dispense Refill Last Dose  . albuterol (PROVENTIL HFA;VENTOLIN HFA) 108 (90 Base) MCG/ACT inhaler Inhale 1-2 puffs into the lungs every 6 (six) hours as needed for wheezing or shortness of breath.   Past Month at Unknown time  . albuterol (PROVENTIL) (2.5 MG/3ML) 0.083% nebulizer solution Take 2.5 mg by nebulization every 6 (six) hours as needed for wheezing or shortness of breath.    at prn  . diclofenac (CATAFLAM) 50 MG tablet Take 50 mg by mouth 2 (two) times daily.  0 05/19/2017 at Unknown time  . Fluticasone-Salmeterol (ADVAIR) 250-50 MCG/DOSE AEPB Inhale 1 puff into the lungs 2 (two) times daily.   05/19/2017 at Unknown time  . metroNIDAZOLE (METROGEL) 1 % gel Apply 1 application topically at bedtime.   05/18/2017 at Unknown time  . omeprazole (PRILOSEC) 20 MG capsule Take 20 mg by mouth daily.     Marland Kitchen venlafaxine XR (EFFEXOR-XR) 75 MG 24 hr capsule Take 75 mg by mouth daily with breakfast.   05/19/2017 at Unknown time  . traZODone (DESYREL) 50 MG tablet Take 50 mg by mouth at bedtime.  Not Taking at Unknown time    Results for orders placed or performed during the hospital encounter of 05/19/17 (from the past 48 hour(s))  Basic metabolic panel     Status: Abnormal   Collection Time: 05/19/17 12:34 PM  Result Value Ref Range   Sodium 140 135 - 145 mmol/L   Potassium 4.1 3.5 - 5.1 mmol/L   Chloride 107 101 - 111 mmol/L   CO2 25 22 - 32 mmol/L   Glucose, Bld 133 (H) 65 - 99 mg/dL   BUN 11 6 - 20 mg/dL   Creatinine,  Ser 0.57 0.44 - 1.00 mg/dL   Calcium 9.3 8.9 - 10.3 mg/dL   GFR calc non Af Amer >60 >60 mL/min   GFR calc Af Amer >60 >60 mL/min    Comment: (NOTE) The eGFR has been calculated using the CKD EPI equation. This calculation has not been validated in all clinical situations. eGFR's persistently <60 mL/min signify possible Chronic Kidney Disease.    Anion gap 8 5 - 15  CBC     Status: Abnormal   Collection Time: 05/19/17 12:34 PM  Result Value Ref Range   WBC 7.6 4.0 - 10.5 K/uL   RBC 4.86 3.87 - 5.11 MIL/uL   Hemoglobin 15.5 (H) 12.0 - 15.0 g/dL   HCT 46.7 (H) 36.0 - 46.0 %   MCV 96.1 78.0 - 100.0 fL   MCH 31.9 26.0 - 34.0 pg   MCHC 33.2 30.0 - 36.0 g/dL   RDW 13.1 11.5 - 15.5 %   Platelets 216 150 - 400 K/uL  Lipase, blood     Status: None   Collection Time: 05/19/17 12:34 PM  Result Value Ref Range   Lipase 25 11 - 51 U/L  Hepatic function panel     Status: Abnormal   Collection Time: 05/19/17 12:34 PM  Result Value Ref Range   Total Protein 7.0 6.5 - 8.1 g/dL   Albumin 3.7 3.5 - 5.0 g/dL   AST 23 15 - 41 U/L   ALT 27 14 - 54 U/L   Alkaline Phosphatase 93 38 - 126 U/L   Total Bilirubin 0.6 0.3 - 1.2 mg/dL   Bilirubin, Direct <0.1 (L) 0.1 - 0.5 mg/dL   Indirect Bilirubin NOT CALCULATED 0.3 - 0.9 mg/dL  I-stat troponin, ED     Status: Abnormal   Collection Time: 05/19/17 12:42 PM  Result Value Ref Range   Troponin i, poc 0.09 (HH) 0.00 - 0.08 ng/mL   Comment NOTIFIED PHYSICIAN    Comment 3            Comment: Due to the release kinetics of cTnI, a negative result within the first hours of the onset of symptoms does not rule out myocardial infarction with certainty. If myocardial infarction is still suspected, repeat the test at appropriate intervals.   I-stat troponin, ED     Status: Abnormal   Collection Time: 05/19/17  3:39 PM  Result Value Ref Range   Troponin i, poc 0.88 (HH) 0.00 - 0.08 ng/mL   Comment NOTIFIED PHYSICIAN    Comment 3            Comment: Due  to the release kinetics of cTnI, a negative result within the first hours of the onset of symptoms does not rule out myocardial infarction with certainty. If myocardial infarction is still suspected, repeat the test at appropriate intervals.   Troponin I     Status: Abnormal   Collection Time: 05/19/17  4:20 PM  Result Value Ref Range   Troponin I 0.92 (HH) <0.03 ng/mL    Comment: CRITICAL RESULT CALLED TO, READ BACK BY AND VERIFIED WITH: P.PRICE RN @ 1572 05/19/17 BY C.EDENS   HIV antibody (Routine Testing)     Status: None   Collection Time: 05/19/17  6:18 PM  Result Value Ref Range   HIV Screen 4th Generation wRfx Non Reactive Non Reactive    Comment: (NOTE) Performed At: [facility], [place] [id number] Lindon Romp MD BU:[id number]   CBC     Status: None   Collection Time: 05/19/17  6:18 PM  Result Value Ref Range   WBC 9.0 4.0 - 10.5 K/uL   RBC 4.62 3.87 - 5.11 MIL/uL   Hemoglobin 14.6 12.0 - 15.0 g/dL   HCT 44.1 36.0 - 46.0 %   MCV 95.5 78.0 - 100.0 fL   MCH 31.6 26.0 - 34.0 pg   MCHC 33.1 30.0 - 36.0 g/dL   RDW 13.1 11.5 - 15.5 %   Platelets 216 150 - 400 K/uL  Creatinine, serum     Status: None   Collection Time: 05/19/17  6:18 PM  Result Value Ref Range   Creatinine, Ser 0.58 0.44 - 1.00 mg/dL   GFR calc non Af Amer >60 >60 mL/min   GFR calc Af Amer >60 >60 mL/min    Comment: (NOTE) The eGFR has been calculated using the CKD EPI equation. This calculation has not been validated in all clinical situations. eGFR's persistently <60 mL/min signify possible Chronic Kidney Disease.   Troponin I     Status: Abnormal   Collection Time: 05/19/17  6:18 PM  Result Value Ref Range   Troponin I 0.80 (HH) <0.03 ng/mL    Comment: CRITICAL VALUE NOTED.  VALUE IS CONSISTENT WITH PREVIOUSLY REPORTED AND CALLED VALUE.  Basic metabolic panel     Status: Abnormal   Collection Time: 05/19/17  6:18 PM  Result Value Ref Range   Sodium 139 135  - 145 mmol/L   Potassium 3.8 3.5 - 5.1 mmol/L   Chloride 106 101 - 111 mmol/L   CO2 26 22 - 32 mmol/L   Glucose, Bld 101 (H) 65 - 99 mg/dL   BUN 10 6 - 20 mg/dL   Creatinine, Ser 0.57 0.44 - 1.00 mg/dL   Calcium 9.0 8.9 - 10.3 mg/dL   GFR calc non Af Amer >60 >60 mL/min   GFR calc Af Amer >60 >60 mL/min    Comment: (NOTE) The eGFR has been calculated using the CKD EPI equation. This calculation has not been validated in all clinical situations. eGFR's persistently <60 mL/min signify possible Chronic Kidney Disease.    Anion gap 7 5 - 15  Protime-INR     Status: None   Collection Time: 05/19/17  6:18 PM  Result Value Ref Range   Prothrombin Time 12.7 11.4 - 15.2 seconds   INR 0.95   CBC     Status: None   Collection Time: 05/19/17  6:18 PM  Result Value Ref Range   WBC 9.4 4.0 - 10.5 K/uL   RBC 4.71 3.87 - 5.11 MIL/uL   Hemoglobin 14.8 12.0 - 15.0 g/dL   HCT 44.9 36.0 - 46.0 %   MCV 95.3 78.0 - 100.0 fL   MCH 31.4 26.0 - 34.0 pg   MCHC 33.0 30.0 - 36.0 g/dL   RDW 13.0 11.5 - 15.5 %   Platelets 229 150 - 400 K/uL  Troponin I     Status: Abnormal   Collection Time: 05/19/17 11:47 PM  Result Value Ref Range   Troponin I 0.37 (HH) <0.03 ng/mL    Comment: CRITICAL VALUE NOTED.  VALUE IS CONSISTENT WITH PREVIOUSLY REPORTED AND CALLED VALUE.  Troponin I     Status: Abnormal   Collection Time: 05/20/17  5:57 AM  Result Value Ref Range   Troponin I 0.20 (HH) <0.03 ng/mL    Comment: CRITICAL VALUE NOTED.  VALUE IS CONSISTENT WITH PREVIOUSLY REPORTED AND CALLED VALUE.  POCT Activated clotting time     Status: None   Collection Time: 05/20/17  9:30 AM  Result Value Ref Range   Activated Clotting Time 230 seconds  Lipid panel     Status: Abnormal   Collection Time: 05/20/17  7:18 PM  Result Value Ref Range   Cholesterol 141 0 - 200 mg/dL   Triglycerides 111 <150 mg/dL   HDL 32 (L) >40 mg/dL   Total CHOL/HDL Ratio 4.4 RATIO   VLDL 22 0 - 40 mg/dL   LDL Cholesterol 87 0 - 99  mg/dL    Comment:        Total Cholesterol/HDL:CHD Risk Coronary Heart Disease Risk Table                     Men   Women  1/2 Average Risk   3.4   3.3  Average Risk       5.0   4.4  2 X Average Risk   9.6   7.1  3 X Average Risk  23.4   11.0        Use the calculated Patient Ratio above and the CHD Risk Table to determine the patient's CHD Risk.        ATP III CLASSIFICATION (LDL):  <100     mg/dL   Optimal  100-129  mg/dL   Near or Above                    Optimal  130-159  mg/dL   Borderline  160-189  mg/dL   High  >190     mg/dL   Very High   Heparin level (unfractionated)     Status: None   Collection Time: 05/21/17  2:00 AM  Result Value Ref Range   Heparin Unfractionated 0.45 0.30 - 0.70 IU/mL    Comment:        IF HEPARIN RESULTS ARE BELOW EXPECTED VALUES, AND PATIENT DOSAGE HAS BEEN CONFIRMED, SUGGEST FOLLOW UP TESTING OF ANTITHROMBIN III LEVELS.   CBC     Status: None   Collection Time: 05/21/17  2:00 AM  Result Value Ref Range   WBC 9.4 4.0 - 10.5 K/uL   RBC 4.19 3.87 - 5.11 MIL/uL   Hemoglobin 13.0 12.0 - 15.0 g/dL   HCT 40.1 36.0 - 46.0 %   MCV 95.7 78.0 - 100.0 fL   MCH 31.0 26.0 - 34.0 pg   MCHC 32.4 30.0 - 36.0 g/dL   RDW 12.9 11.5 - 15.5 %   Platelets 195 150 - 400 K/uL  Heparin level (unfractionated)     Status: None   Collection Time: 05/21/17  7:56 AM  Result Value Ref Range   Heparin Unfractionated 0.50 0.30 - 0.70 IU/mL    Comment:        IF HEPARIN RESULTS ARE BELOW EXPECTED VALUES, AND PATIENT DOSAGE HAS BEEN CONFIRMED, SUGGEST FOLLOW UP TESTING OF ANTITHROMBIN III LEVELS.  Dg Chest 2 View  Result Date: 05/19/2017 CLINICAL DATA:  Epigastric abdominal pain beginning 2 days ago. History of COPD. EXAM: CHEST  2 VIEW COMPARISON:  None. FINDINGS: Normal cardiac silhouette and mediastinal contours. The lungs are hyperexpanded with flattening of the bilateral hemidiaphragms. No focal parenchymal opacities. No pleural effusion or  pneumothorax. No evidence of edema. No acute osseous abnormalities. IMPRESSION: Hyperexpanded lungs without acute cardiopulmonary disease. Specifically, no focal airspace opacities to suggest pneumonia. Electronically Signed   By: Sandi Mariscal M.D.   On: 05/19/2017 12:51   Ct Chest W Contrast  Result Date: 05/19/2017 CLINICAL DATA:  Epigastric pain 72 hours after endoscopy. EXAM: CT CHEST WITHOUT AND WITH CONTRAST TECHNIQUE: Protocol was requested by GI. A without chest CT was obtained. Subsequently Multidetector CT imaging of the chest was performed during intravenous and oral contrast administration. CONTRAST:  58m ISOVUE-300 IOPAMIDOL (ISOVUE-300) INJECTION 61%IV COMPARISON:  None. FINDINGS: Cardiovascular: Normal heart size. No pericardial effusion. No evidence of acute vascular disease. Mild atherosclerotic calcifications seen along the distal brachiocephalic. Mediastinum/Nodes: No pneumomediastinum, esophageal thickening, or extravasation of the oral contrast which is seen throughout the esophagus. Negative for adenopathy or swelling. Lungs/Pleura: Negative for pneumonia or air leak. Upper Abdomen: The stomach was covered and is negative for wall thickening. No epigastric pneumoperitoneum. Musculoskeletal: No acute or aggressive finding. Probable dermal inclusion cyst in the subcutaneous right paramedian lower back. IMPRESSION: No acute finding. There is no pneumomediastinum or extravasation of esophageal contrast. The stomach was visualized and negative. Electronically Signed   By: JMonte FantasiaM.D.   On: 05/19/2017 20:40    Review of Systems  Constitutional: Positive for malaise/fatigue.  Respiratory: Positive for shortness of breath and wheezing.   Cardiovascular: Positive for chest pain. Negative for orthopnea, claudication and leg swelling.  Gastrointestinal: Positive for abdominal pain.  All other systems reviewed and are negative.  Blood pressure 118/69, pulse 80, temperature 98.4 F  (36.9 C), temperature source Oral, resp. rate 19, height 5' (1.524 m), weight 164 lb 4.8 oz (74.5 kg), SpO2 97 %. Physical Exam  Vitals reviewed. Constitutional: She is oriented to person, place, and time. She appears well-developed and well-nourished. No distress.  HENT:  Head: Normocephalic and atraumatic.  Mouth/Throat: No oropharyngeal exudate.  Eyes: Conjunctivae and EOM are normal. No scleral icterus.  Neck: Neck supple. No thyromegaly present.  Cardiovascular: Normal rate, regular rhythm, normal heart sounds and intact distal pulses.  Exam reveals no gallop and no friction rub.   No murmur heard. Respiratory: Effort normal and breath sounds normal. No respiratory distress. She has no wheezes. She has no rales.  GI: Soft. She exhibits no distension. There is no tenderness.  Musculoskeletal: She exhibits no edema.  Lymphadenopathy:    She has no cervical adenopathy.  Neurological: She is alert and oriented to person, place, and time. No cranial nerve deficit. She exhibits normal muscle tone.  Skin: Skin is warm and dry.   Cardiac catheterization Conclusion   Conclusions: 1. 60% ostial LAD stenosis that is not hemodynamically significant by FFR (0.88). Mild disease also noted in the distal LMCA, LCx, and RCA. 2. Focal mid inferior akinesis with otherwise preserved LV contraction. No culprit lesion to explain wall motion abnormality. Takotsubo variant would be a consideration. 3. Mildly elevated left ventricular filling pressure.  Recommendations: 1. Aggressive medical therapy, including addition of high-intensity statin therapy and beta-blockade. 2. Proceed with echo to confirm inferior wall-motion abnormality.  CNelva Bush MD [facility] Pager: [phone]  Echocardiogram Study Conclusions  - Left ventricle: The cavity size was normal. Systolic function was   normal. The estimated ejection fraction was in the range of 60%   to 65%. Wall motion was normal;  there were no regional wall   motion abnormalities. Doppler parameters are consistent with   abnormal left ventricular relaxation (grade 1 diastolic   dysfunction). There was no evidence of elevated ventricular   filling pressure by Doppler parameters. - Aortic valve: There was trivial regurgitation. - Mitral valve: Structurally normal valve. There was mild   regurgitation. - Right ventricle: Systolic function was normal. - Right atrium: The atrium was normal in size. - Tricuspid valve: There was no regurgitation. - Pulmonary arteries: Systolic pressure was within the normal   range. - Inferior vena cava: The vessel was normal in size. - Pericardium, extracardiac: There was no pericardial effusion. I personally reviewed the cardiac catheterization films. There appears to be a 75-80% stenosis of the ostial LAD. There is some distal LAD disease. No disease of the circumflex or right coronary.  Assessment/Plan: 61 year old woman with a history of tobacco abuse but no other known cardiac risk factors who presents with unstable chest pain. She describes this as a squeezing or pressure sensation substernally, which sounds classic for angina. Her troponins were elevated on admission. She's had continued unstable chest pain symptoms while in the hospital. She has a significant ostial LAD lesion. The FFR would suggest that this is not hemodynamically significant but her symptoms suggest otherwise. In my opinion the safest approach would be to bypass the LAD. It is not 100% guaranteed that her pain will resolve, but I would be uncomfortable treating her medically given her unstable symptoms post catheterization.  I have discussed the general nature of the procedure, the need for general anesthesia, the possible need for CPB, and the incisions to be used with Sharon Cochran and her family. We discussed the expected hospital stay, overall recovery and short and long term outcomes. I reviewed the  indications, risks, benefits, and alternatives. They understand the risks include, but are not limited to death, stroke, MI, DVT/PE, bleeding, possible need for transfusion, infections, cardiac arrhythmias, as well as other organ system dysfunction including respiratory, renal, or GI complications.   She accepts the risks and agrees to proceed.  For CABG in a.m. 05/22/2017.  Melrose Nakayama 05/21/2017, 10:20 AM

## 2017-05-21 NOTE — Progress Notes (Signed)
Pre-op Cardiac Surgery  Carotid Findings:  Findings suggest 1-39% internal carotid artery stenosis bilaterally. Vertebral arteries are patent with antegrade flow.  Upper Extremity Right Left  Brachial Pressures 133-triphasic 128-triphasic  Radial Waveforms Triphasic Triphasic  Ulnar Waveforms Biphasic Monophasic  Palmar Arch (Allen's Test) Within normal limits Within normal limits   Lower  Extremity Right Left  Dorsalis Pedis Triphasic Triphasic  Posterior Tibial Triphasic Triphasic   05/21/2017 1:07 PM Gertie FeyMichelle Abir Craine, BS, RVT, RDCS, RDMS

## 2017-05-21 NOTE — Progress Notes (Signed)
ANTICOAGULATION CONSULT NOTE - Follow Up Consult  Pharmacy Consult for Heparin Indication: chest pain/ACS  No Known Allergies  Patient Measurements: Height: 5' (152.4 cm) Weight: 164 lb 4.8 oz (74.5 kg) IBW/kg (Calculated) : 45.5 Heparin Dosing Weight: 62.5 kg  Vital Signs: Temp: 98.4 F (36.9 C) (06/04 2351) Temp Source: Oral (06/04 2351) BP: 118/69 (06/05 0855) Pulse Rate: 80 (06/05 0300)  Labs:  Recent Labs  05/19/17 1234  05/19/17 1818 05/19/17 2347 05/20/17 0557 05/21/17 0200 05/21/17 0756  HGB 15.5*  --  14.8  14.6  --   --  13.0  --   HCT 46.7*  --  44.9  44.1  --   --  40.1  --   PLT 216  --  229  216  --   --  195  --   LABPROT  --   --  12.7  --   --   --   --   INR  --   --  0.95  --   --   --   --   HEPARINUNFRC  --   --   --   --   --  0.45 0.50  CREATININE 0.57  --  0.58  0.57  --   --   --   --   TROPONINI  --   < > 0.80* 0.37* 0.20*  --   --   < > = values in this interval not displayed.  Estimated Creatinine Clearance: 68.3 mL/min (by C-G formula based on SCr of 0.58 mg/dL).  Assessment:  61yo female s/p heart cath 6/4 with recurring chest pain. IV heparin begun last night.  Heparin level remains therapeutic (0.50) on 900 units/hr. On IV NTG at 5 mcg.  For cardiac surgery on 05/22/17.  Goal of Therapy:  Heparin level 0.3-0.7 units/ml Monitor platelets by anticoagulation protocol: Yes   Plan:   Continue heparin drip at 900 units/hr.  Daily heparin level and CBC.  For off-pump CABG on 05/22/17.  Dennie FettersEgan, Raymundo Rout Donovan, RPh Pager: [phone] 05/21/2017,10:37 AM

## 2017-05-21 NOTE — Progress Notes (Signed)
[id number] Gave pt and family OHS booklet and care guide. Wrote down how to view pre op video. Discussed the importance of mobility and IS after surgery. Discussed sternal precautions. Demonstrated how to get up and down without use of arms. Family stated pt would have someone available 24/7 after discharge for first week. Will see pt after surgery. Luetta Nuttingharlene Silvester Reierson RN BSN 05/21/2017 1:34 PM

## 2017-05-21 NOTE — Progress Notes (Signed)
The patient has been seen in conjunction with Geoffry Paradise, NP. All aspects of care have been considered and discussed. The patient has been personally interviewed, examined, and all clinical data has been reviewed.   Stable night on IV nitroglycerin. Vague low-grade chest fullness but markedly reduced in intensity compared to prior.  ECG demonstrates inferior symmetrical T-wave inversion, which has developed since hospitalization. She does have focal inferobasal wall motion abnormality but without obstructive lesion noted on angiography to account for that abnormality.  Given the clinical course, we have decided upon LIMA to LAD is ultimate treatment for the patient's ostial LAD lesion. I am somewhat concerned about absence of EKG changes during chest pain. However, I can find no alternative explanation for class IV angina that we have demonstrated is responsive to sublingual nitroglycerin and associated with mild enzyme elevation.   Progress Note  Patient Name: Sharon Cochran Date of Encounter: 05/21/2017  Primary Cardiologist: New ( from [facility])  Subjective   No further chest pain, but mild tightness (1/10) since being on IV nitro.   Inpatient Medications    Scheduled Meds: . aspirin  81 mg Oral Daily  . atorvastatin  80 mg Oral q1800  . bisacodyl  5 mg Oral Once  . [START ON 05/22/2017] chlorhexidine  15 mL Mouth/Throat Once  . Chlorhexidine Gluconate Cloth  6 each Topical Once   And  . Chlorhexidine Gluconate Cloth  6 each Topical Once  . [START ON 05/22/2017] diazepam  5 mg Oral Once  . metoprolol tartrate  12.5 mg Oral BID  . [START ON 05/22/2017] metoprolol tartrate  12.5 mg Oral Once  . nicotine  14 mg Transdermal Daily  . nitroGLYCERIN  0.4 mg Sublingual Once  . pantoprazole  40 mg Oral Q0600  . sodium chloride flush  3 mL Intravenous Q12H  . venlafaxine XR  75 mg Oral Q breakfast   Continuous Infusions: . sodium chloride    . sodium chloride    .  heparin 900 Units/hr (05/21/17 0900)  . nitroGLYCERIN     PRN Meds: sodium chloride, sodium chloride, acetaminophen, ALPRAZolam, gi cocktail, ondansetron (ZOFRAN) IV, sodium chloride flush, traMADol, zolpidem   Vital Signs    Vitals:   05/20/17 2213 05/20/17 2351 05/21/17 0300 05/21/17 0855  BP:  124/65 110/70 118/69  Pulse: 85 77 80   Resp:  17 19   Temp:  98.4 F (36.9 C)    TempSrc:  Oral    SpO2:  98% 97%   Weight:   164 lb 4.8 oz (74.5 kg)   Height:        Intake/Output Summary (Last 24 hours) at 05/21/17 1057 Last data filed at 05/21/17 0900  Gross per 24 hour  Intake           850.65 ml  Output                0 ml  Net           850.65 ml   Filed Weights   05/19/17 1800 05/20/17 0419 05/21/17 0300  Weight: 163 lb 4.8 oz (74.1 kg) 165 lb 1.6 oz (74.9 kg) 164 lb 4.8 oz (74.5 kg)    Telemetry    SR - Personally Reviewed  ECG    SR with continued inferior TWI  - Personally Reviewed  Physical Exam   General: Well developed, well nourished, female appearing in no acute distress. Head: Normocephalic, atraumatic.  Neck: Supple without bruits, JVD. Lungs:  Resp regular and unlabored, CTA. Heart: RRR, S1, S2, no S3, S4, or murmur; no rub. Abdomen: Soft, non-tender, non-distended with normoactive bowel sounds. No hepatomegaly. No rebound/guarding. No obvious abdominal masses. Extremities: No clubbing, cyanosis, edema. Distal pedal pulses are 2+ bilaterally. Right radial cath site stable.  Neuro: Alert and oriented X 3. Moves all extremities spontaneously. Psych: Normal affect.  Labs    Chemistry Recent Labs Lab 05/19/17 1234 05/19/17 1818  NA 140 139  K 4.1 3.8  CL 107 106  CO2 25 26  GLUCOSE 133* 101*  BUN 11 10  CREATININE 0.57 0.58  0.57  CALCIUM 9.3 9.0  PROT 7.0  --   ALBUMIN 3.7  --   AST 23  --   ALT 27  --   ALKPHOS 93  --   BILITOT 0.6  --   GFRNONAA >60 >60  >60  GFRAA >60 >60  >60  ANIONGAP 8 7     Hematology Recent Labs Lab  05/19/17 1234 05/19/17 1818 05/21/17 0200  WBC 7.6 9.4  9.0 9.4  RBC 4.86 4.71  4.62 4.19  HGB 15.5* 14.8  14.6 13.0  HCT 46.7* 44.9  44.1 40.1  MCV 96.1 95.3  95.5 95.7  MCH 31.9 31.4  31.6 31.0  MCHC 33.2 33.0  33.1 32.4  RDW 13.1 13.0  13.1 12.9  PLT 216 229  216 195    Cardiac Enzymes Recent Labs Lab 05/19/17 1620 05/19/17 1818 05/19/17 2347 05/20/17 0557  TROPONINI 0.92* 0.80* 0.37* 0.20*    Recent Labs Lab 05/19/17 1242 05/19/17 1539  TROPIPOC 0.09* 0.88*     BNPNo results for input(s): BNP, PROBNP in the last 168 hours.   DDimer No results for input(s): DDIMER in the last 168 hours.    Radiology    Dg Chest 2 View  Result Date: 05/19/2017 CLINICAL DATA:  Epigastric abdominal pain beginning 2 days ago. History of COPD. EXAM: CHEST  2 VIEW COMPARISON:  None. FINDINGS: Normal cardiac silhouette and mediastinal contours. The lungs are hyperexpanded with flattening of the bilateral hemidiaphragms. No focal parenchymal opacities. No pleural effusion or pneumothorax. No evidence of edema. No acute osseous abnormalities. IMPRESSION: Hyperexpanded lungs without acute cardiopulmonary disease. Specifically, no focal airspace opacities to suggest pneumonia. Electronically Signed   By: Simonne ComeJohn  Watts M.D.   On: 05/19/2017 12:51   Ct Chest W Contrast  Result Date: 05/19/2017 CLINICAL DATA:  Epigastric pain 72 hours after endoscopy. EXAM: CT CHEST WITHOUT AND WITH CONTRAST TECHNIQUE: Protocol was requested by GI. A without chest CT was obtained. Subsequently Multidetector CT imaging of the chest was performed during intravenous and oral contrast administration. CONTRAST:  75mL ISOVUE-300 IOPAMIDOL (ISOVUE-300) INJECTION 61%IV COMPARISON:  None. FINDINGS: Cardiovascular: Normal heart size. No pericardial effusion. No evidence of acute vascular disease. Mild atherosclerotic calcifications seen along the distal brachiocephalic. Mediastinum/Nodes: No pneumomediastinum, esophageal  thickening, or extravasation of the oral contrast which is seen throughout the esophagus. Negative for adenopathy or swelling. Lungs/Pleura: Negative for pneumonia or air leak. Upper Abdomen: The stomach was covered and is negative for wall thickening. No epigastric pneumoperitoneum. Musculoskeletal: No acute or aggressive finding. Probable dermal inclusion cyst in the subcutaneous right paramedian lower back. IMPRESSION: No acute finding. There is no pneumomediastinum or extravasation of esophageal contrast. The stomach was visualized and negative. Electronically Signed   By: Marnee SpringJonathon  Watts M.D.   On: 05/19/2017 20:40    Cardiac Studies   LHC: 05/20/17  Conclusion   Conclusions: 1. 60% ostial  LAD stenosis that is not hemodynamically significant by FFR (0.88). Mild disease also noted in the distal LMCA, LCx, and RCA. 2. Focal mid inferior akinesis with otherwise preserved LV contraction. No culprit lesion to explain wall motion abnormality. Takotsubo variant would be a consideration. 3. Mildly elevated left ventricular filling pressure.  Recommendations: 4. Aggressive medical therapy, including addition of high-intensity statin therapy and beta-blockade. 5. Proceed with echo to confirm inferior wall-motion abnormality.   TTE: 05/20/17  Study Conclusions  - Left ventricle: The cavity size was normal. Systolic function was   normal. The estimated ejection fraction was in the range of 60%   to 65%. Wall motion was normal; there were no regional wall   motion abnormalities. Doppler parameters are consistent with   abnormal left ventricular relaxation (grade 1 diastolic   dysfunction). There was no evidence of elevated ventricular   filling pressure by Doppler parameters. - Aortic valve: There was trivial regurgitation. - Mitral valve: Structurally normal valve. There was mild   regurgitation. - Right ventricle: Systolic function was normal. - Right atrium: The atrium was normal in  size. - Tricuspid valve: There was no regurgitation. - Pulmonary arteries: Systolic pressure was within the normal   range. - Inferior vena cava: The vessel was normal in size. - Pericardium, extracardiac: There was no pericardial effusion.  Patient Profile     61 y.o. female with epigastric pain developing after upper GI endoscopy on 05/17/17. Cardiac cath reveals nonobstructive CAD. Focal inferior wall motion abnormality.  Assessment & Plan    1. NSTEMI: Underwent LHC yesterday that showed nonobstructive CAD with osital LAD-FFR 0.88, but inferior WMA on cath. Attempted to treat medically but developed 3 recurrent episodes of chest pain. Placed back on IV heparin with nitro gtt yesterday evening. No recurrent chest pain, mild chest tightness (1/10). Seen by CVTS and felt to be a good candidate for CABG with LIMA to the LAD given ongoing symptoms.  -- continue asa, plavix, BB -- planned CABG in am  2.Tobacco use: Cessation encouraged.  Signed, Laverda Page, NP  05/21/2017, 10:57 AM

## 2017-05-21 NOTE — Progress Notes (Signed)
ANTICOAGULATION CONSULT NOTE - Follow-up Consult  Pharmacy Consult for Heparin Indication: atrial fibrillation  No Known Allergies  Patient Measurements: Height: 5' (152.4 cm) Weight: 165 lb 1.6 oz (74.9 kg) IBW/kg (Calculated) : 45.5 Heparin Dosing Weight: 62.5 kg  Vital Signs: Temp: 98.4 F (36.9 C) (06/04 2351) Temp Source: Oral (06/04 2351) BP: 124/65 (06/04 2351) Pulse Rate: 77 (06/04 2351)  Labs:  Recent Labs  05/19/17 1234  05/19/17 1818 05/19/17 2347 05/20/17 0557 05/21/17 0200  HGB 15.5*  --  14.8  14.6  --   --  13.0  HCT 46.7*  --  44.9  44.1  --   --  40.1  PLT 216  --  229  216  --   --  195  LABPROT  --   --  12.7  --   --   --   INR  --   --  0.95  --   --   --   HEPARINUNFRC  --   --   --   --   --  0.45  CREATININE 0.57  --  0.58  0.57  --   --   --   TROPONINI  --   < > 0.80* 0.37* 0.20*  --   < > = values in this interval not displayed.  Estimated Creatinine Clearance: 68.5 mL/min (by C-G formula based on SCr of 0.58 mg/dL).  Assessment: 61yo female s/p heart cath 6/5 with recurring chest pain. Pharmacy is consulted to restart heparin for ACS/chest pain. Heparin level therapeutic (0.45) on gtt at 900 units/hr. No bleeding noted. Hgb trending down.   Goal of Therapy:  Heparin level 0.3-0.7 units/ml Monitor platelets by anticoagulation protocol: Yes   Plan:  Continue heparin infusion at 900 units/hr Will f/u 6 hr confirmatory heparin level  Christoper Fabianaron Camila Norville, PharmD, BCPS Clinical pharmacist, pager [phone] 05/21/2017,2:33 AM

## 2017-05-22 ENCOUNTER — Inpatient Hospital Stay (HOSPITAL_COMMUNITY): Payer: BLUE CROSS/BLUE SHIELD

## 2017-05-22 ENCOUNTER — Inpatient Hospital Stay (HOSPITAL_COMMUNITY)
Admission: EM | Disposition: A | Discharge status: Home / Self Care | Payer: Self-pay | Source: Home / Self Care | Attending: Thoracic Surgery (Cardiothoracic Vascular Surgery)

## 2017-05-22 DIAGNOSIS — Z951 Presence of aortocoronary bypass graft: Secondary | ICD-10-CM

## 2017-05-22 HISTORY — PX: TEE WITHOUT CARDIOVERSION: SHX5443

## 2017-05-22 HISTORY — PX: CORONARY ARTERY BYPASS GRAFT: SHX141

## 2017-05-22 LAB — POCT I-STAT, CHEM 8
BUN: 17 mg/dL (ref 6–20)
BUN: 15 mg/dL (ref 6–20)
BUN: 13 mg/dL (ref 6–20)
BUN: 14 mg/dL (ref 6–20)
CALCIUM ION: 1.2 mmol/L (ref 1.15–1.40)
CALCIUM ION: 1.24 mmol/L (ref 1.15–1.40)
CALCIUM ION: 1.16 mmol/L (ref 1.15–1.40)
CHLORIDE: 102 mmol/L (ref 101–111)
CHLORIDE: 104 mmol/L (ref 101–111)
CHLORIDE: 105 mmol/L (ref 101–111)
CREATININE: 0.5 mg/dL (ref 0.44–1.00)
CREATININE: 0.4 mg/dL — AB (ref 0.44–1.00)
CREATININE: 0.4 mg/dL — AB (ref 0.44–1.00)
Calcium, Ion: 1.23 mmol/L (ref 1.15–1.40)
Chloride: 102 mmol/L (ref 101–111)
Creatinine, Ser: 0.5 mg/dL (ref 0.44–1.00)
GLUCOSE: 128 mg/dL — AB (ref 65–99)
GLUCOSE: 148 mg/dL — AB (ref 65–99)
GLUCOSE: 122 mg/dL — AB (ref 65–99)
Glucose, Bld: 107 mg/dL — ABNORMAL HIGH (ref 65–99)
HCT: 35 % — ABNORMAL LOW (ref 36.0–46.0)
HCT: 39 % (ref 36.0–46.0)
HEMATOCRIT: 32 % — AB (ref 36.0–46.0)
HEMATOCRIT: 34 % — AB (ref 36.0–46.0)
Hemoglobin: 10.9 g/dL — ABNORMAL LOW (ref 12.0–15.0)
Hemoglobin: 11.6 g/dL — ABNORMAL LOW (ref 12.0–15.0)
Hemoglobin: 11.9 g/dL — ABNORMAL LOW (ref 12.0–15.0)
Hemoglobin: 13.3 g/dL (ref 12.0–15.0)
POTASSIUM: 3.8 mmol/L (ref 3.5–5.1)
POTASSIUM: 3.9 mmol/L (ref 3.5–5.1)
POTASSIUM: 5.3 mmol/L — AB (ref 3.5–5.1)
Potassium: 3.9 mmol/L (ref 3.5–5.1)
SODIUM: 139 mmol/L (ref 135–145)
SODIUM: 140 mmol/L (ref 135–145)
Sodium: 139 mmol/L (ref 135–145)
Sodium: 142 mmol/L (ref 135–145)
TCO2: 27 mmol/L (ref 0–100)
TCO2: 28 mmol/L (ref 0–100)
TCO2: 28 mmol/L (ref 0–100)
TCO2: 27 mmol/L (ref 0–100)

## 2017-05-22 LAB — POCT I-STAT 3, ART BLOOD GAS (G3+)
ACID-BASE DEFICIT: 1 mmol/L (ref 0.0–2.0)
Acid-base deficit: 1 mmol/L (ref 0.0–2.0)
Acid-base deficit: 2 mmol/L (ref 0.0–2.0)
BICARBONATE: 26.9 mmol/L (ref 20.0–28.0)
BICARBONATE: 27.8 mmol/L (ref 20.0–28.0)
Bicarbonate: 26.8 mmol/L (ref 20.0–28.0)
Bicarbonate: 23.7 mmol/L (ref 20.0–28.0)
O2 Saturation: 95 %
O2 Saturation: 92 %
O2 Saturation: 92 %
O2 Saturation: 92 %
PCO2 ART: 41.1 mmHg (ref 32.0–48.0)
PCO2 ART: 54.9 mmHg — AB (ref 32.0–48.0)
PH ART: 7.264 — AB (ref 7.350–7.450)
PH ART: 7.309 — AB (ref 7.350–7.450)
PO2 ART: 86 mmHg (ref 83.0–108.0)
PO2 ART: 75 mmHg — AB (ref 83.0–108.0)
PO2 ART: 64 mmHg — AB (ref 83.0–108.0)
Patient temperature: 97.9
Patient temperature: 97.5
TCO2: 29 mmol/L (ref 0–100)
TCO2: 29 mmol/L (ref 0–100)
TCO2: 25 mmol/L (ref 0–100)
TCO2: 29 mmol/L (ref 0–100)
pCO2 arterial: 58.9 mmHg — ABNORMAL HIGH (ref 32.0–48.0)
pCO2 arterial: 59.1 mmHg — ABNORMAL HIGH (ref 32.0–48.0)
pH, Arterial: 7.263 — ABNORMAL LOW (ref 7.350–7.450)
pH, Arterial: 7.366 (ref 7.350–7.450)
pO2, Arterial: 70 mmHg — ABNORMAL LOW (ref 83.0–108.0)

## 2017-05-22 LAB — GLUCOSE, CAPILLARY
GLUCOSE-CAPILLARY: 102 mg/dL — AB (ref 65–99)
GLUCOSE-CAPILLARY: 103 mg/dL — AB (ref 65–99)
GLUCOSE-CAPILLARY: 104 mg/dL — AB (ref 65–99)
GLUCOSE-CAPILLARY: 148 mg/dL — AB (ref 65–99)
Glucose-Capillary: 92 mg/dL (ref 65–99)

## 2017-05-22 LAB — BASIC METABOLIC PANEL
ANION GAP: 8 (ref 5–15)
BUN: 14 mg/dL (ref 6–20)
CHLORIDE: 101 mmol/L (ref 101–111)
CO2: 26 mmol/L (ref 22–32)
Calcium: 8.7 mg/dL — ABNORMAL LOW (ref 8.9–10.3)
Creatinine, Ser: 0.67 mg/dL (ref 0.44–1.00)
GFR calc Af Amer: >60 mL/min (ref >60)
GLUCOSE: 115 mg/dL — AB (ref 65–99)
POTASSIUM: 3.7 mmol/L (ref 3.5–5.1)
Sodium: 135 mmol/L (ref 135–145)

## 2017-05-22 LAB — CBC
HEMATOCRIT: 40.1 % (ref 36.0–46.0)
HEMATOCRIT: 36.9 % (ref 36.0–46.0)
HEMATOCRIT: 40.2 % (ref 36.0–46.0)
HEMOGLOBIN: 13 g/dL (ref 12.0–15.0)
HEMOGLOBIN: 12.8 g/dL (ref 12.0–15.0)
Hemoglobin: 11.9 g/dL — ABNORMAL LOW (ref 12.0–15.0)
MCH: 31.2 pg (ref 26.0–34.0)
MCH: 30.8 pg (ref 26.0–34.0)
MCH: 30.8 pg (ref 26.0–34.0)
MCHC: 32.4 g/dL (ref 30.0–36.0)
MCHC: 32.2 g/dL (ref 30.0–36.0)
MCHC: 31.8 g/dL (ref 30.0–36.0)
MCV: 96.2 fL (ref 78.0–100.0)
MCV: 95.6 fL (ref 78.0–100.0)
MCV: 96.6 fL (ref 78.0–100.0)
Platelets: 181 10*3/uL (ref 150–400)
Platelets: 149 10*3/uL — ABNORMAL LOW (ref 150–400)
Platelets: 183 10*3/uL (ref 150–400)
RBC: 4.17 MIL/uL (ref 3.87–5.11)
RBC: 3.86 MIL/uL — ABNORMAL LOW (ref 3.87–5.11)
RBC: 4.16 MIL/uL (ref 3.87–5.11)
RDW: 12.9 % (ref 11.5–15.5)
RDW: 12.8 % (ref 11.5–15.5)
RDW: 12.9 % (ref 11.5–15.5)
WBC: 8.3 10*3/uL (ref 4.0–10.5)
WBC: 6.9 10*3/uL (ref 4.0–10.5)
WBC: 11.2 10*3/uL — ABNORMAL HIGH (ref 4.0–10.5)

## 2017-05-22 LAB — SURGICAL PCR SCREEN
MRSA, PCR: NEGATIVE
Staphylococcus aureus: NEGATIVE

## 2017-05-22 LAB — APTT: APTT: 30 s (ref 24–36)

## 2017-05-22 LAB — POCT I-STAT 4, (NA,K, GLUC, HGB,HCT)
Glucose, Bld: 117 mg/dL — ABNORMAL HIGH (ref 65–99)
HCT: 34 % — ABNORMAL LOW (ref 36.0–46.0)
Hemoglobin: 11.6 g/dL — ABNORMAL LOW (ref 12.0–15.0)
POTASSIUM: 3.2 mmol/L — AB (ref 3.5–5.1)
Sodium: 143 mmol/L (ref 135–145)

## 2017-05-22 LAB — BLOOD GAS, ARTERIAL
ACID-BASE EXCESS: 2.2 mmol/L — AB (ref 0.0–2.0)
Bicarbonate: 27 mmol/L (ref 20.0–28.0)
DRAWN BY: 270271
O2 Saturation: 92.5 %
PH ART: 7.371 (ref 7.350–7.450)
Patient temperature: 98.7
pCO2 arterial: 47.7 mmHg (ref 32.0–48.0)
pO2, Arterial: 68 mmHg — ABNORMAL LOW (ref 83.0–108.0)

## 2017-05-22 LAB — PROTIME-INR
INR: 1.12
Prothrombin Time: 14.4 seconds (ref 11.4–15.2)

## 2017-05-22 LAB — HEPARIN LEVEL (UNFRACTIONATED): Heparin Unfractionated: 0.76 IU/mL — ABNORMAL HIGH (ref 0.30–0.70)

## 2017-05-22 LAB — CREATININE, SERUM
Creatinine, Ser: 0.64 mg/dL (ref 0.44–1.00)
GFR calc Af Amer: >60 mL/min (ref >60)
GFR calc non Af Amer: >60 mL/min (ref >60)

## 2017-05-22 LAB — MAGNESIUM: Magnesium: 2.6 mg/dL — ABNORMAL HIGH (ref 1.7–2.4)

## 2017-05-22 SURGERY — OFF PUMP CORONARY ARTERY BYPASS GRAFTING (CABG)
Anesthesia: General

## 2017-05-22 MED ORDER — HEPARIN SODIUM (PORCINE) 1000 UNIT/ML IJ SOLN
INTRAMUSCULAR | Status: AC
  Filled 2017-05-22: qty 1

## 2017-05-22 MED ORDER — SODIUM CHLORIDE 0.9% FLUSH
3.0000 mL | Freq: Two times a day (BID) | INTRAVENOUS | Status: DC
  Administered 2017-05-23: 3 mL via INTRAVENOUS

## 2017-05-22 MED ORDER — MORPHINE SULFATE (PF) 4 MG/ML IV SOLN
2.0000 mg | INTRAVENOUS | Status: DC | PRN
  Administered 2017-05-22 – 2017-05-23 (×3): 2 mg via INTRAVENOUS
  Administered 2017-05-23: 4 mg via INTRAVENOUS
  Administered 2017-05-23: 2 mg via INTRAVENOUS
  Administered 2017-05-23 – 2017-05-24 (×4): 4 mg via INTRAVENOUS
  Filled 2017-05-22 (×11): qty 1

## 2017-05-22 MED ORDER — FAMOTIDINE IN NACL 20-0.9 MG/50ML-% IV SOLN
20.0000 mg | Freq: Two times a day (BID) | INTRAVENOUS | Status: AC
  Administered 2017-05-22 – 2017-05-23 (×2): 20 mg via INTRAVENOUS
  Filled 2017-05-22: qty 50

## 2017-05-22 MED ORDER — DOCUSATE SODIUM 100 MG PO CAPS
200.0000 mg | ORAL_CAPSULE | Freq: Every day | ORAL | Status: DC
  Administered 2017-05-23 – 2017-05-26 (×4): 200 mg via ORAL
  Filled 2017-05-22 (×4): qty 2

## 2017-05-22 MED ORDER — ALBUMIN HUMAN 5 % IV SOLN
250.0000 mL | INTRAVENOUS | Status: AC | PRN
  Administered 2017-05-22 (×2): 250 mL via INTRAVENOUS
  Filled 2017-05-22: qty 250

## 2017-05-22 MED ORDER — ONDANSETRON HCL 4 MG/2ML IJ SOLN: 4.0000 mg | Freq: Four times a day (QID) | INTRAMUSCULAR | Status: DC | PRN

## 2017-05-22 MED ORDER — MIDAZOLAM HCL 5 MG/ML IJ SOLN
INTRAMUSCULAR | Status: DC | PRN
  Administered 2017-05-22: 2 mg via INTRAVENOUS
  Administered 2017-05-22: 5 mg via INTRAVENOUS
  Administered 2017-05-22: 3 mg via INTRAVENOUS

## 2017-05-22 MED ORDER — ROCURONIUM BROMIDE 10 MG/ML (PF) SYRINGE
PREFILLED_SYRINGE | INTRAVENOUS | Status: AC
  Filled 2017-05-22: qty 5

## 2017-05-22 MED ORDER — SODIUM CHLORIDE 0.9% FLUSH: 3.0000 mL | INTRAVENOUS | Status: DC | PRN

## 2017-05-22 MED ORDER — SODIUM CHLORIDE 0.9 % IV SOLN
INTRAVENOUS | Status: DC
  Filled 2017-05-22: qty 1

## 2017-05-22 MED ORDER — ONDANSETRON HCL 4 MG/2ML IJ SOLN
INTRAMUSCULAR | Status: DC | PRN
  Administered 2017-05-22: 4 mg via INTRAVENOUS

## 2017-05-22 MED ORDER — LEVALBUTEROL HCL 0.63 MG/3ML IN NEBU
0.6300 mg | INHALATION_SOLUTION | Freq: Four times a day (QID) | RESPIRATORY_TRACT | Status: DC | PRN
  Filled 2017-05-22: qty 3

## 2017-05-22 MED ORDER — LACTATED RINGERS IV SOLN: INTRAVENOUS | Status: DC

## 2017-05-22 MED ORDER — SODIUM CHLORIDE 0.9 % IV SOLN: INTRAVENOUS | Status: DC

## 2017-05-22 MED ORDER — PHENYLEPHRINE HCL 10 MG/ML IJ SOLN
0.0000 ug/min | INTRAMUSCULAR | Status: DC
  Administered 2017-05-23: 5 ug/min via INTRAVENOUS
  Filled 2017-05-22 (×2): qty 2

## 2017-05-22 MED ORDER — LEVALBUTEROL HCL 0.63 MG/3ML IN NEBU
0.6300 mg | INHALATION_SOLUTION | Freq: Once | RESPIRATORY_TRACT | Status: AC
  Administered 2017-05-22: 0.63 mg via RESPIRATORY_TRACT

## 2017-05-22 MED ORDER — PANTOPRAZOLE SODIUM 40 MG PO TBEC: 40.0000 mg | DELAYED_RELEASE_TABLET | Freq: Every day | ORAL | Status: DC

## 2017-05-22 MED ORDER — 0.9 % SODIUM CHLORIDE (POUR BTL) OPTIME
TOPICAL | Status: DC | PRN
  Administered 2017-05-22: 5000 mL

## 2017-05-22 MED ORDER — INSULIN ASPART 100 UNIT/ML ~~LOC~~ SOLN
0.0000 [IU] | SUBCUTANEOUS | Status: DC
  Administered 2017-05-22: 2 [IU] via SUBCUTANEOUS

## 2017-05-22 MED ORDER — METOPROLOL TARTRATE 25 MG/10 ML ORAL SUSPENSION
12.5000 mg | Freq: Two times a day (BID) | ORAL | Status: DC
  Filled 2017-05-22: qty 5

## 2017-05-22 MED ORDER — CEFUROXIME SODIUM 1.5 G IV SOLR
1.5000 g | Freq: Two times a day (BID) | INTRAVENOUS | Status: AC
  Administered 2017-05-22 – 2017-05-24 (×4): 1.5 g via INTRAVENOUS
  Filled 2017-05-22 (×4): qty 1.5

## 2017-05-22 MED ORDER — METRONIDAZOLE 0.75 % EX GEL
1.0000 "application " | Freq: Every day | CUTANEOUS | Status: DC
  Administered 2017-05-24 – 2017-05-25 (×2): 1 via TOPICAL
  Filled 2017-05-22 (×2): qty 45

## 2017-05-22 MED ORDER — BISACODYL 5 MG PO TBEC
10.0000 mg | DELAYED_RELEASE_TABLET | Freq: Every day | ORAL | Status: DC
  Administered 2017-05-23 – 2017-05-26 (×3): 10 mg via ORAL
  Filled 2017-05-22 (×4): qty 2

## 2017-05-22 MED ORDER — ALPRAZOLAM 0.25 MG PO TABS: 0.2500 mg | ORAL_TABLET | Freq: Two times a day (BID) | ORAL | Status: DC | PRN

## 2017-05-22 MED ORDER — PROPOFOL 10 MG/ML IV BOLUS
INTRAVENOUS | Status: AC
  Filled 2017-05-22: qty 20

## 2017-05-22 MED ORDER — FENTANYL CITRATE (PF) 250 MCG/5ML IJ SOLN
INTRAMUSCULAR | Status: AC
  Filled 2017-05-22: qty 20

## 2017-05-22 MED ORDER — NITROGLYCERIN IN D5W 200-5 MCG/ML-% IV SOLN
0.0000 ug/min | INTRAVENOUS | Status: DC
Start: 2017-05-22 — End: 2017-05-24

## 2017-05-22 MED ORDER — SODIUM CHLORIDE 0.45 % IV SOLN: INTRAVENOUS | Status: DC | PRN

## 2017-05-22 MED ORDER — CHLORHEXIDINE GLUCONATE 0.12% ORAL RINSE (MEDLINE KIT)
15.0000 mL | Freq: Two times a day (BID) | OROMUCOSAL | Status: DC
  Administered 2017-05-22 – 2017-05-24 (×4): 15 mL via OROMUCOSAL

## 2017-05-22 MED ORDER — VENLAFAXINE HCL ER 75 MG PO CP24
75.0000 mg | ORAL_CAPSULE | Freq: Every day | ORAL | Status: DC
  Administered 2017-05-23 – 2017-05-24 (×2): 75 mg via ORAL
  Filled 2017-05-22 (×2): qty 1

## 2017-05-22 MED ORDER — POTASSIUM CHLORIDE 10 MEQ/50ML IV SOLN
10.0000 meq | Freq: Once | INTRAVENOUS | Status: AC
  Administered 2017-05-22: 10 meq via INTRAVENOUS
  Filled 2017-05-22: qty 50

## 2017-05-22 MED ORDER — INSULIN REGULAR BOLUS VIA INFUSION
0.0000 [IU] | Freq: Three times a day (TID) | INTRAVENOUS | Status: DC
  Filled 2017-05-22: qty 10

## 2017-05-22 MED ORDER — ACETAMINOPHEN 650 MG RE SUPP
650.0000 mg | Freq: Once | RECTAL | Status: AC
  Administered 2017-05-22: 650 mg via RECTAL

## 2017-05-22 MED ORDER — ACETAMINOPHEN 160 MG/5ML PO SOLN: 650.0000 mg | Freq: Once | ORAL | Status: AC

## 2017-05-22 MED ORDER — LACTATED RINGERS IV SOLN
INTRAVENOUS | Status: DC | PRN
  Administered 2017-05-22 (×4): via INTRAVENOUS

## 2017-05-22 MED ORDER — FENTANYL CITRATE (PF) 250 MCG/5ML IJ SOLN
INTRAMUSCULAR | Status: AC
  Filled 2017-05-22: qty 5

## 2017-05-22 MED ORDER — HEPARIN SODIUM (PORCINE) 1000 UNIT/ML IJ SOLN
INTRAMUSCULAR | Status: DC | PRN
  Administered 2017-05-22: 13000 [IU] via INTRAVENOUS

## 2017-05-22 MED ORDER — PHENYLEPHRINE HCL 10 MG/ML IJ SOLN
INTRAMUSCULAR | Status: DC | PRN
  Administered 2017-05-22: 25 ug/min via INTRAVENOUS

## 2017-05-22 MED ORDER — VANCOMYCIN HCL IN DEXTROSE 1-5 GM/200ML-% IV SOLN
1000.0000 mg | Freq: Once | INTRAVENOUS | Status: AC
  Administered 2017-05-22: 1000 mg via INTRAVENOUS
  Filled 2017-05-22: qty 200

## 2017-05-22 MED ORDER — ROCURONIUM BROMIDE 100 MG/10ML IV SOLN
INTRAVENOUS | Status: DC | PRN
  Administered 2017-05-22: 20 mg via INTRAVENOUS
  Administered 2017-05-22 (×2): 80 mg via INTRAVENOUS
  Administered 2017-05-22: 30 mg via INTRAVENOUS

## 2017-05-22 MED ORDER — MIDAZOLAM HCL 10 MG/2ML IJ SOLN
INTRAMUSCULAR | Status: AC
  Filled 2017-05-22: qty 2

## 2017-05-22 MED ORDER — ORAL CARE MOUTH RINSE
15.0000 mL | Freq: Four times a day (QID) | OROMUCOSAL | Status: DC
  Administered 2017-05-23 – 2017-05-25 (×6): 15 mL via OROMUCOSAL

## 2017-05-22 MED ORDER — PANTOPRAZOLE SODIUM 40 MG PO TBEC
40.0000 mg | DELAYED_RELEASE_TABLET | Freq: Every day | ORAL | Status: DC
  Administered 2017-05-23: 40 mg via ORAL
  Filled 2017-05-22: qty 1

## 2017-05-22 MED ORDER — METOPROLOL TARTRATE 12.5 MG HALF TABLET: 12.5000 mg | ORAL_TABLET | Freq: Two times a day (BID) | ORAL | Status: DC

## 2017-05-22 MED ORDER — PROPOFOL 10 MG/ML IV BOLUS: INTRAVENOUS | Status: DC | PRN

## 2017-05-22 MED ORDER — LACTATED RINGERS IV SOLN: 500.0000 mL | Freq: Once | INTRAVENOUS | Status: DC | PRN

## 2017-05-22 MED ORDER — MOMETASONE FURO-FORMOTEROL FUM 200-5 MCG/ACT IN AERO
2.0000 | INHALATION_SPRAY | Freq: Two times a day (BID) | RESPIRATORY_TRACT | Status: DC
  Administered 2017-05-23 – 2017-05-26 (×7): 2 via RESPIRATORY_TRACT
  Filled 2017-05-22 (×2): qty 8.8

## 2017-05-22 MED ORDER — PROTAMINE SULFATE 10 MG/ML IV SOLN
INTRAVENOUS | Status: AC
  Filled 2017-05-22: qty 25

## 2017-05-22 MED ORDER — CHLORHEXIDINE GLUCONATE 0.12 % MT SOLN
15.0000 mL | OROMUCOSAL | Status: AC
  Administered 2017-05-22: 15 mL via OROMUCOSAL

## 2017-05-22 MED ORDER — MIDAZOLAM HCL 2 MG/2ML IJ SOLN
2.0000 mg | INTRAMUSCULAR | Status: DC | PRN
  Administered 2017-05-22: 2 mg via INTRAVENOUS
  Filled 2017-05-22: qty 2

## 2017-05-22 MED ORDER — SODIUM CHLORIDE 0.9 % IJ SOLN
OROMUCOSAL | Status: DC | PRN
  Administered 2017-05-22: 10:00:00 via TOPICAL

## 2017-05-22 MED ORDER — GLYCOPYRROLATE 0.2 MG/ML IJ SOLN
INTRAMUSCULAR | Status: DC | PRN
  Administered 2017-05-22: 0.1 mg via INTRAVENOUS

## 2017-05-22 MED ORDER — ACETAMINOPHEN 500 MG PO TABS
1000.0000 mg | ORAL_TABLET | Freq: Four times a day (QID) | ORAL | Status: DC
  Administered 2017-05-23 – 2017-05-26 (×14): 1000 mg via ORAL
  Filled 2017-05-22 (×16): qty 2

## 2017-05-22 MED ORDER — PROTAMINE SULFATE 10 MG/ML IV SOLN
INTRAVENOUS | Status: DC | PRN
  Administered 2017-05-22: 50 mg via INTRAVENOUS
  Administered 2017-05-22 (×2): 10 mg via INTRAVENOUS
  Administered 2017-05-22: 50 mg via INTRAVENOUS

## 2017-05-22 MED ORDER — ACETAMINOPHEN 160 MG/5ML PO SOLN
1000.0000 mg | Freq: Four times a day (QID) | ORAL | Status: DC
  Filled 2017-05-22: qty 40.6

## 2017-05-22 MED ORDER — PROPOFOL 10 MG/ML IV BOLUS
INTRAVENOUS | Status: DC | PRN
  Administered 2017-05-22: 30 mg via INTRAVENOUS

## 2017-05-22 MED ORDER — MORPHINE SULFATE (PF) 4 MG/ML IV SOLN
1.0000 mg | INTRAVENOUS | Status: AC | PRN
  Administered 2017-05-22 (×3): 4 mg via INTRAVENOUS
  Administered 2017-05-23: 2 mg via INTRAVENOUS
  Filled 2017-05-22 (×2): qty 1

## 2017-05-22 MED ORDER — ASPIRIN EC 325 MG PO TBEC
325.0000 mg | DELAYED_RELEASE_TABLET | Freq: Every day | ORAL | Status: DC
  Administered 2017-05-23 – 2017-05-26 (×4): 325 mg via ORAL
  Filled 2017-05-22 (×4): qty 1

## 2017-05-22 MED ORDER — SODIUM CHLORIDE 0.9 % IV SOLN
0.0000 ug/kg/h | INTRAVENOUS | Status: DC
  Filled 2017-05-22: qty 2

## 2017-05-22 MED ORDER — SODIUM CHLORIDE 0.9 % IV SOLN
250.0000 mL | INTRAVENOUS | Status: DC
  Administered 2017-05-23: 10 mL via INTRAVENOUS

## 2017-05-22 MED ORDER — ASPIRIN 81 MG PO CHEW: 324.0000 mg | CHEWABLE_TABLET | Freq: Every day | ORAL | Status: DC

## 2017-05-22 MED ORDER — TRAMADOL HCL 50 MG PO TABS
50.0000 mg | ORAL_TABLET | ORAL | Status: DC | PRN
  Administered 2017-05-24: 50 mg via ORAL
  Filled 2017-05-22: qty 1
  Filled 2017-05-22: qty 2

## 2017-05-22 MED ORDER — GELATIN ABSORBABLE MT POWD
OROMUCOSAL | Status: DC | PRN
  Administered 2017-05-22: 10:00:00 via TOPICAL

## 2017-05-22 MED ORDER — FENTANYL CITRATE (PF) 250 MCG/5ML IJ SOLN
INTRAMUSCULAR | Status: DC | PRN
  Administered 2017-05-22: 150 ug via INTRAVENOUS
  Administered 2017-05-22 (×3): 250 ug via INTRAVENOUS
  Administered 2017-05-22: 50 ug via INTRAVENOUS
  Administered 2017-05-22 (×2): 150 ug via INTRAVENOUS

## 2017-05-22 MED ORDER — ARTIFICIAL TEARS OPHTHALMIC OINT
TOPICAL_OINTMENT | OPHTHALMIC | Status: DC | PRN
  Administered 2017-05-22: 1 via OPHTHALMIC

## 2017-05-22 MED ORDER — POTASSIUM CHLORIDE 10 MEQ/50ML IV SOLN
10.0000 meq | INTRAVENOUS | Status: AC
  Administered 2017-05-22 (×4): 10 meq via INTRAVENOUS

## 2017-05-22 MED ORDER — ATORVASTATIN CALCIUM 80 MG PO TABS
80.0000 mg | ORAL_TABLET | Freq: Every day | ORAL | Status: DC
  Administered 2017-05-23 – 2017-05-25 (×3): 80 mg via ORAL
  Filled 2017-05-22 (×3): qty 1

## 2017-05-22 MED ORDER — OXYCODONE HCL 5 MG PO TABS
5.0000 mg | ORAL_TABLET | ORAL | Status: DC | PRN
  Administered 2017-05-23 – 2017-05-24 (×7): 10 mg via ORAL
  Administered 2017-05-24: 5 mg via ORAL
  Administered 2017-05-24 – 2017-05-25 (×4): 10 mg via ORAL
  Administered 2017-05-25: 5 mg via ORAL
  Administered 2017-05-25: 10 mg via ORAL
  Administered 2017-05-26 (×2): 5 mg via ORAL
  Filled 2017-05-22 (×6): qty 2
  Filled 2017-05-22: qty 1
  Filled 2017-05-22 (×3): qty 2
  Filled 2017-05-22: qty 1
  Filled 2017-05-22 (×3): qty 2
  Filled 2017-05-22 (×2): qty 1

## 2017-05-22 MED ORDER — METOPROLOL TARTRATE 5 MG/5ML IV SOLN: 2.5000 mg | INTRAVENOUS | Status: DC | PRN

## 2017-05-22 MED ORDER — BISACODYL 10 MG RE SUPP
10.0000 mg | Freq: Every day | RECTAL | Status: DC
  Administered 2017-05-25: 10 mg via RECTAL
  Filled 2017-05-22: qty 1

## 2017-05-22 MED ORDER — MAGNESIUM SULFATE 4 GM/100ML IV SOLN
4.0000 g | Freq: Once | INTRAVENOUS | Status: AC
  Administered 2017-05-22: 4 g via INTRAVENOUS
  Filled 2017-05-22: qty 100

## 2017-05-22 MED FILL — Heparin Sodium (Porcine) Inj 1000 Unit/ML: INTRAMUSCULAR | Qty: 30 | Status: AC

## 2017-05-22 MED FILL — Potassium Chloride Inj 2 mEq/ML: INTRAVENOUS | Qty: 10 | Status: AC

## 2017-05-22 MED FILL — Magnesium Sulfate Inj 50%: INTRAMUSCULAR | Qty: 10 | Status: AC

## 2017-05-22 SURGICAL SUPPLY — 114 items
ADAPTER CARDIO PERF ANTE/RETRO (ADAPTER) IMPLANT
APPLIER CLIP 9.375 MED OPEN (MISCELLANEOUS)
APPLIER CLIP 9.375 SM OPEN (CLIP)
BAG DECANTER FOR FLEXI CONT (MISCELLANEOUS) ×3 IMPLANT
BANDAGE ACE 4X5 VEL STRL LF (GAUZE/BANDAGES/DRESSINGS) IMPLANT
BANDAGE ACE 6X5 VEL STRL LF (GAUZE/BANDAGES/DRESSINGS) IMPLANT
BASKET HEART  (ORDER IN 25'S) (MISCELLANEOUS)
BASKET HEART (ORDER IN 25'S) (MISCELLANEOUS)
BASKET HEART (ORDER IN 25S) (MISCELLANEOUS) IMPLANT
BLADE CLIPPER SURG (BLADE) IMPLANT
BLADE STERNUM SYSTEM 6 (BLADE) ×3 IMPLANT
BLADE SURG 11 STRL SS (BLADE) IMPLANT
BLOWER MISTER CAL-MED (MISCELLANEOUS) ×3 IMPLANT
BNDG GAUZE ELAST 4 BULKY (GAUZE/BANDAGES/DRESSINGS) IMPLANT
CANISTER SUCT 3000ML PPV (MISCELLANEOUS) ×3 IMPLANT
CANNULA GUNDRY RCSP 15FR (MISCELLANEOUS) IMPLANT
CATH ROBINSON RED A/P 18FR (CATHETERS) ×3 IMPLANT
CATH THORACIC 36FR (CATHETERS) ×3 IMPLANT
CATH THORACIC 36FR RT ANG (CATHETERS) ×3 IMPLANT
CLIP APPLIE 9.375 MED OPEN (MISCELLANEOUS) IMPLANT
CLIP APPLIE 9.375 SM OPEN (CLIP) IMPLANT
CLIP FOGARTY SPRING 6M (CLIP) IMPLANT
CLIP RETRACTION 3.0MM CORONARY (MISCELLANEOUS) ×3 IMPLANT
CLIP TI MEDIUM 24 (CLIP) IMPLANT
CLIP TI WIDE RED SMALL 24 (CLIP) ×3 IMPLANT
CONN Y 3/8X3/8X3/8  BEN (MISCELLANEOUS)
CONN Y 3/8X3/8X3/8 BEN (MISCELLANEOUS) IMPLANT
COVER SURGICAL LIGHT HANDLE (MISCELLANEOUS) ×3 IMPLANT
CRADLE DONUT ADULT HEAD (MISCELLANEOUS) ×3 IMPLANT
DRAPE CARDIOVASCULAR INCISE (DRAPES) ×2
DRAPE SLUSH/WARMER DISC (DRAPES) ×3 IMPLANT
DRAPE SRG 135X102X78XABS (DRAPES) ×1 IMPLANT
DRSG COVADERM 4X14 (GAUZE/BANDAGES/DRESSINGS) ×3 IMPLANT
ELECT CAUTERY BLADE 6.4 (BLADE) IMPLANT
ELECT REM PT RETURN 9FT ADLT (ELECTROSURGICAL) ×6
ELECTRODE REM PT RTRN 9FT ADLT (ELECTROSURGICAL) ×2 IMPLANT
FELT TEFLON 1X6 (MISCELLANEOUS) ×3 IMPLANT
GAUZE SPONGE 4X4 12PLY STRL (GAUZE/BANDAGES/DRESSINGS) ×3 IMPLANT
GLOVE BIOGEL M STER SZ 6 (GLOVE) ×6 IMPLANT
GLOVE BIOGEL PI IND STRL 6 (GLOVE) ×4 IMPLANT
GLOVE BIOGEL PI IND STRL 7.0 (GLOVE) ×2 IMPLANT
GLOVE BIOGEL PI INDICATOR 6 (GLOVE) ×8
GLOVE BIOGEL PI INDICATOR 7.0 (GLOVE) ×4
GLOVE EUDERMIC 7 POWDERFREE (GLOVE) ×9 IMPLANT
GOWN STRL REUS W/ TWL LRG LVL3 (GOWN DISPOSABLE) ×5 IMPLANT
GOWN STRL REUS W/ TWL XL LVL3 (GOWN DISPOSABLE) ×1 IMPLANT
GOWN STRL REUS W/TWL LRG LVL3 (GOWN DISPOSABLE) ×10
GOWN STRL REUS W/TWL XL LVL3 (GOWN DISPOSABLE) ×2
HEMOSTAT POWDER SURGIFOAM 1G (HEMOSTASIS) ×9 IMPLANT
HEMOSTAT SURGICEL 2X14 (HEMOSTASIS) IMPLANT
INSERT FOGARTY XLG (MISCELLANEOUS) IMPLANT
KIT BASIN OR (CUSTOM PROCEDURE TRAY) ×3 IMPLANT
KIT ROOM TURNOVER OR (KITS) ×3 IMPLANT
KIT SUCTION CATH 14FR (SUCTIONS) ×3 IMPLANT
KIT VASOVIEW HEMOPRO VH 3000 (KITS) IMPLANT
LINE VENT (MISCELLANEOUS) IMPLANT
MARKER GRAFT CORONARY BYPASS (MISCELLANEOUS) IMPLANT
MARKER SKIN DUAL TIP RULER LAB (MISCELLANEOUS) IMPLANT
NS IRRIG 1000ML POUR BTL (IV SOLUTION) ×15 IMPLANT
OFFPUMP STABILIZER SUV (MISCELLANEOUS) ×3 IMPLANT
PACK OPEN HEART (CUSTOM PROCEDURE TRAY) ×3 IMPLANT
PAD ARMBOARD 7.5X6 YLW CONV (MISCELLANEOUS) ×6 IMPLANT
PEDIATRIC SUCKERS (MISCELLANEOUS) IMPLANT
PENCIL BUTTON HOLSTER BLD 10FT (ELECTRODE) IMPLANT
POSITIONER ACROBAT-I OFFPUMP (MISCELLANEOUS) ×3 IMPLANT
PUNCH AORTIC ROTATE 4.0MM (MISCELLANEOUS) IMPLANT
PUNCH AORTIC ROTATE 4.5MM 8IN (MISCELLANEOUS) IMPLANT
PUNCH AORTIC ROTATE 5MM 8IN (MISCELLANEOUS) IMPLANT
SET CARDIOPLEGIA MPS 5001102 (MISCELLANEOUS) IMPLANT
SHUNT FLO COIL 2.00MM (MISCELLANEOUS) ×3 IMPLANT
SOLUTION ANTI FOG 6CC (MISCELLANEOUS) IMPLANT
SPONGE LAP 4X18 X RAY DECT (DISPOSABLE) ×3 IMPLANT
STABILIZER COHN CARD 89 9340 (MISCELLANEOUS) IMPLANT
STOPCOCK 4 WAY LG BORE MALE ST (IV SETS) IMPLANT
SUT BONE WAX W31G (SUTURE) ×3 IMPLANT
SUT MNCRL AB 4-0 PS2 18 (SUTURE) IMPLANT
SUT PROLENE 3 0 SH DA (SUTURE) ×3 IMPLANT
SUT PROLENE 4 0 RB 1 (SUTURE)
SUT PROLENE 4 0 SH DA (SUTURE) IMPLANT
SUT PROLENE 4-0 RB1 .5 CRCL 36 (SUTURE) IMPLANT
SUT PROLENE 6 0 C 1 30 (SUTURE) IMPLANT
SUT PROLENE 8 0 BV175 6 (SUTURE) IMPLANT
SUT SILK  1 MH (SUTURE) ×2
SUT SILK 1 MH (SUTURE) ×1 IMPLANT
SUT SILK 1 SH (SUTURE) IMPLANT
SUT SILK 1 TIES 10X30 (SUTURE) IMPLANT
SUT STEEL 6MS V (SUTURE) ×3 IMPLANT
SUT STEEL STERNAL CCS#1 18IN (SUTURE) IMPLANT
SUT STEEL SZ 6 DBL 3X14 BALL (SUTURE) ×3 IMPLANT
SUT VIC AB 1 CTX 36 (SUTURE)
SUT VIC AB 1 CTX36XBRD ANBCTR (SUTURE) IMPLANT
SUT VIC AB 2-0 CT1 27 (SUTURE)
SUT VIC AB 2-0 CT1 TAPERPNT 27 (SUTURE) IMPLANT
SUT VIC AB 2-0 CTX 27 (SUTURE) IMPLANT
SUT VIC AB 3-0 SH 27 (SUTURE)
SUT VIC AB 3-0 SH 27X BRD (SUTURE) IMPLANT
SUT VIC AB 3-0 X1 27 (SUTURE) IMPLANT
SUT VICRYL 4-0 PS2 18IN ABS (SUTURE) IMPLANT
SUTURE E-PAK OPEN HEART (SUTURE) ×3 IMPLANT
SYSTEM SAHARA CHEST DRAIN ATS (WOUND CARE) ×3 IMPLANT
TAPE CLOTH SURG 4X10 WHT LF (GAUZE/BANDAGES/DRESSINGS) ×3 IMPLANT
TAPE PAPER 2X10 WHT MICROPORE (GAUZE/BANDAGES/DRESSINGS) ×3 IMPLANT
TAPES RETRACTO (MISCELLANEOUS) ×3 IMPLANT
TOWEL GREEN STERILE (TOWEL DISPOSABLE) ×3 IMPLANT
TOWEL GREEN STERILE FF (TOWEL DISPOSABLE) IMPLANT
TOWEL OR 17X24 6PK STRL BLUE (TOWEL DISPOSABLE) IMPLANT
TOWEL OR 17X26 10 PK STRL BLUE (TOWEL DISPOSABLE) IMPLANT
TRAY FOLEY SILVER 16FR TEMP (SET/KITS/TRAYS/PACK) ×3 IMPLANT
TUBE SUCT INTRACARD DLP 20F (MISCELLANEOUS) ×3 IMPLANT
TUBING INSUFFLATION (TUBING) IMPLANT
TUNNELER SHEATH ON-Q 11GX8 DSP (PAIN MANAGEMENT) IMPLANT
UNDERPAD 30X30 (UNDERPADS AND DIAPERS) ×3 IMPLANT
WAND VISUFLOW SURG W/TUBING (MISCELLANEOUS) IMPLANT
WATER STERILE IRR 1000ML POUR (IV SOLUTION) ×6 IMPLANT

## 2017-05-22 NOTE — Progress Notes (Signed)
Pt placed on cp/ps per rapid wean protocol.

## 2017-05-22 NOTE — Anesthesia Procedure Notes (Signed)
Procedure Name: Intubation Date/Time: 05/22/2017 8:50 AM Performed by: Marena ChancyBECKNER, Cameren Odwyer S Pre-anesthesia Checklist: Patient identified, Emergency Drugs available, Suction available and Patient being monitored Patient Re-evaluated:Patient Re-evaluated prior to inductionOxygen Delivery Method: Circle System Utilized Preoxygenation: Pre-oxygenation with 100% oxygen Intubation Type: IV induction Ventilation: Mask ventilation without difficulty Laryngoscope Size: Miller and 2 Grade View: Grade I Tube type: Oral Tube size: 7.5 mm Number of attempts: 1 Airway Equipment and Method: Stylet and Oral airway Placement Confirmation: ETT inserted through vocal cords under direct vision,  positive ETCO2 and breath sounds checked- equal and bilateral Tube secured with: Tape Dental Injury: Teeth and Oropharynx as per pre-operative assessment

## 2017-05-22 NOTE — Progress Notes (Signed)
Resp Rate increased to 18 s/p post op ABG.  MD/RN at bedside and aware.

## 2017-05-22 NOTE — Progress Notes (Signed)
RN called Dr Dorris FetchHendrickson re: ABG- per MD increase VT and give HHN. VT increased to 500 ml.

## 2017-05-22 NOTE — Anesthesia Postprocedure Evaluation (Signed)
Anesthesia Post Note  Patient: Rachael FeeLaura L Harlacher  Procedure(s) Performed: Procedure(s) (LRB): OFF PUMP CORONARY ARTERY BYPASS GRAFTING (CABG) x1 using left internal mammary artery (N/A) TRANSESOPHAGEAL ECHOCARDIOGRAM (TEE) (N/A)     Patient location during evaluation: ICU Anesthesia Type: General Level of consciousness: sedated Pain management: pain level controlled Vital Signs Assessment: post-procedure vital signs reviewed and stable Respiratory status: patient remains intubated per anesthesia plan Cardiovascular status: stable Anesthetic complications: no    Last Vitals:  Vitals:   05/22/17 0511 05/22/17 1225  BP: 124/80   Pulse: 65 73  Resp: 17 12  Temp: 36.9 C     Last Pain:  Vitals:   05/22/17 0511  TempSrc: Oral  PainSc:                  Danaja Lasota

## 2017-05-22 NOTE — Op Note (Signed)
NAMRica Koyanagi:  Sharon Cochran, Sharon Cochran          ACCOUNT NO.:  [id number]  MEDICAL RECORD NO.:  [id number]  LOCATION:                                 FACILITY:  PHYSICIAN:  Salvatore DecentSteven C. Dorris FetchHendrickson, M.D. DATE OF BIRTH:  DATE OF PROCEDURE:  05/22/2017 DATE OF DISCHARGE:                              OPERATIVE REPORT   PREOPERATIVE DIAGNOSIS:  Single-vessel coronary disease with unstable angina.  POSTOPERATIVE DIAGNOSIS:  Single-vessel coronary disease with unstable angina.  PROCEDURE:  Median sternotomy Off pump coronary artery bypass grafting x1  Left internal mammary artery to left anterior descending.  SURGEON:  Salvatore DecentSteven C. Dorris FetchHendrickson, M.D.  ASSISTANT:  Jari Favreessa Conte, PA.  ANESTHESIA:  General.  FINDINGS:  LIMA small but accepted a 1.5 mm probe.  Excellent flow. LAD good quality.  Transesophageal echocardiography revealed preserved left ventricular wall motion.  CLINICAL NOTE:  Sharon Cochran is a 61 year old woman from [place] who was admitted this past weekend with an unstable coronary syndrome.  She had a mildly elevated troponin.  She underwent cardiac catheterization and was found to have an ostial LAD stenosis.  Initially, the plan was to try to treat medically, but she had several episodes of unstable chest pain in the early post catheterization.  I was consulted for consideration for coronary artery bypass grafting.  The indications, risks, benefits, and alternatives were discussed in detail with the patient.  She understood and accepted the risks and agreed to proceed.  OPERATIVE NOTE:  Sharon Cochran was brought to the preoperative holding area on May 22, 2017.  Dr. Val Eaglehristopher Moser of the Anesthesia Service placed an arterial blood pressure monitoring line and a central venous catheter.  She was taken to the operating room, anesthetized, and intubated.  Intravenous antibiotics were administered.  A Foley catheter was placed.  Transesophageal echocardiography was  performed, it revealed preserved left ventricular wall motion with no segmental wall motion deficits.  There was no valvular pathology.  The chest, abdomen, and legs were prepped and draped in the usual sterile fashion.  A median sternotomy was performed.  The left internal mammary artery was harvested using standard technique.  It was a relatively small vessel. The patient was given the off pump heparin dose prior to dividing the distal end of the mammary artery. A 1.5 mm probe did pass in the vessel and there was excellent flow through the mammary artery.  The mammary was placed into a papaverine soaked sponge and left in the left pleural space.  A sternal retractor was placed and opened.  The pericardium was opened. There was a small pericardial effusion.  Pericardial stay sutures were placed.  Adequate anticoagulation was confirmed with ACT measurement. The patient was placed in Trendelenburg position.  Two deep pericardial stay sutures were placed and covered with Rumel tourniquets to prevent epicardial laceration.  Traction on the stay sutures allowed the apex of the heart to prolapse anteriorly out of the pericardial space.  The patient tolerated this well hemodynamically.  The Maquet off pump stabilization system was used and the apical stabilizer was placed and then the stabilizer was placed at the site selected for anastomosis on the mid LAD.  The LAD was a 2 mm good quality target at  the site. Silastic tapes were placed proximally and distally.  The distal tape was never tightened.  An arteriotomy was performed, traction was placed on the proximal silastic tape and there was good control of the bleeding. The arteriotomy was lengthened proximally and distally.  The left internal mammary artery then was anastomosed end-to-side to the LAD with a running 8-0 Prolene suture.  At the completion of the anastomosis, a probe did pass both proximally in the LAD and mammary artery as  well as distally in the LAD.  The bulldog clamp was removed and the suture was tied.  A doppler probe revealed a good arterial signal in the distal LAD, even with the proximal LAD occluded, the silastic tape on the proximal LAD was released.  The patient tolerated the anastomosis well without any significant hemodynamic changes or EKG changes.  The heart was allowed to rest back in its normal position in the pericardium.  There was transient ST-elevation when the heart was repositioned, but that resolved without any intervention.  A test dose of protamine was administered and was well tolerated.  The remainder of the protamine was administered slowly without incident.  Epicardial pacing wires were placed on the right ventricle and right atrium, but pacing was not needed.  The chest was copiously irrigated with warm saline.  The aorta was reapproximated over the ascending aorta.  It could not be reapproximated over the heart without potential for undue tension on the mammary artery.  A 36-French chest tubes were placed into the anterior mediastinum and the left pleural space via separate subcostal incisions.  The sternum was closed with a combination of single and double heavy gauge stainless steel wires, pectoralis fascia, subcutaneous tissue, and skin were closed in standard fashion.  All sponge, needle, and instrument counts were correct at the end of the procedure and the patient was taken from the operating room to the Surgical Intensive Care Unit in good condition.     Salvatore Decent Dorris Fetch, M.D.     SCH/MEDQ  D:  05/22/2017  T:  05/22/2017  Job:  [id number]

## 2017-05-22 NOTE — Progress Notes (Signed)
Settings changed per Rapid Wean Protocol  

## 2017-05-22 NOTE — Transfer of Care (Signed)
Immediate Anesthesia Transfer of Care Note  Patient: Sharon Cochran  Procedure(s) Performed: Procedure(s): OFF PUMP CORONARY ARTERY BYPASS GRAFTING (CABG) x1 using left internal mammary artery (N/A) TRANSESOPHAGEAL ECHOCARDIOGRAM (TEE) (N/A)  Patient Location: SICU  Anesthesia Type:General  Level of Consciousness: sedated and unresponsive  Airway & Oxygen Therapy: Patient remains intubated per anesthesia plan and Patient placed on Ventilator (see vital sign flow sheet for setting)  Post-op Assessment: Report given to RN and Post -op Vital signs reviewed and stable  Post vital signs: Reviewed and stable  Last Vitals:  Vitals:   05/21/17 1922 05/22/17 0511  BP: 113/84 124/80  Pulse: 63 65  Resp: 17 17  Temp: 36.9 C 36.9 C    Last Pain:  Vitals:   05/22/17 0511  TempSrc: Oral  PainSc:       Patients Stated Pain Goal: 2 (05/21/17 0855)  Complications: No apparent anesthesia complications

## 2017-05-22 NOTE — Anesthesia Procedure Notes (Signed)
Central Venous Catheter Insertion Performed by: Val EagleMOSER, Priya Matsen, anesthesiologist Start/End6/05/2017 8:02 AM, 05/22/2017 8:11 AM Patient location: Pre-op. Preanesthetic checklist: patient identified, IV checked, site marked, risks and benefits discussed, surgical consent, monitors and equipment checked, pre-op evaluation, timeout performed and anesthesia consent Position: Trendelenburg Lidocaine 1% used for infiltration and patient sedated Hand hygiene performed , maximum sterile barriers used  and Seldinger technique used Catheter size: 8.5 Fr Total catheter length 10. Central line was placed.Sheath introducer Procedure performed using ultrasound guided technique. Ultrasound Notes:anatomy identified, needle tip was noted to be adjacent to the nerve/plexus identified, no ultrasound evidence of intravascular and/or intraneural injection and image(s) printed for medical record Attempts: 1 Following insertion, line sutured and dressing applied. Post procedure assessment: blood return through all ports, free fluid flow and no air  Patient tolerated the procedure well with no immediate complications.

## 2017-05-22 NOTE — Progress Notes (Signed)
Placed back on full support at this time per MD request after ABG results. Will try to wean again in three hours per RN

## 2017-05-22 NOTE — Anesthesia Procedure Notes (Signed)
Procedures

## 2017-05-22 NOTE — H&P (View-Only) (Signed)
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 [name] is an 61 y.o. female.  HPI: 61 yo woman presented with a cc/o CP  Sharon Cochran is a 61 yo woman with a 40 pack year history of tobacco abuse and COPD. She has been experiencing epigastric/ substernal chest discomfort for the past several weeks. On Friday 62 she had an endoscopy with biopsy in [place] ([place]). She came to [facility] for a great nephew's graduation. On Saturday she was experiencing a good deal of pain, which she described as a squeezing dull pressure sensation that made it difficult for her to breathe. She thought it might be related to her endoscopy. On Sunday at church she developed severe pain and had to go outside. The pain was unrelenting and she was brought to [facility] emergency room. Her initial troponin was 0.09 but it quickly rose to 0.92. She underwent cardiac catheterization yesterday which revealed an 80-90% ostial LAD stenosis. An FFR was performed and was 0.88. It was felt that she might be able to be managed medically. However she had 3 additional episodes of chest pressure post catheterization and had be placed back on intravenous heparin and nitroglycerin. She currently complains of a vague tightness but not the pressure that she was experiencing yesterday.  Past Medical History:  Diagnosis Date  . COPD (chronic obstructive pulmonary disease) ([place])   . GERD (gastroesophageal reflux disease)     Past Surgical History:  Procedure Laterality Date  . INTRAVASCULAR PRESSURE WIRE/FFR STUDY N/A 05/20/2017   Procedure: Intravascular Pressure Wire/FFR Study;  Surgeon: Nelva Bush, MD;  Location: [place] [facility];  Service: Cardiovascular;  Laterality: N/A;  . LEFT HEART CATH AND CORONARY ANGIOGRAPHY N/A 05/20/2017   Procedure: Left Heart Cath and Coronary Angiography;  Surgeon: Nelva Bush, MD;  Location: [place] [facility];   Service: Cardiovascular;  Laterality: N/A;  . TUBAL LIGATION      History reviewed. No pertinent family history.  Social History:  reports that she has been smoking Cigarettes.  She has been smoking about 1.00 pack per day. She has never used smokeless tobacco. She reports that she drinks about 2.4 oz of alcohol per week . She reports that she does not use drugs.  Allergies: No Known Allergies  Medications:  Prior to Admission:  Prescriptions Prior to Admission  Medication Sig Dispense Refill Last Dose  . albuterol (PROVENTIL HFA;VENTOLIN HFA) 108 (90 Base) MCG/ACT inhaler Inhale 1-2 puffs into the lungs every 6 (six) hours as needed for wheezing or shortness of breath.   Past Month at Unknown time  . albuterol (PROVENTIL) (2.5 MG/3ML) 0.083% nebulizer solution Take 2.5 mg by nebulization every 6 (six) hours as needed for wheezing or shortness of breath.    at prn  . diclofenac (CATAFLAM) 50 MG tablet Take 50 mg by mouth 2 (two) times daily.  0 05/19/2017 at Unknown time  . Fluticasone-Salmeterol (ADVAIR) 250-50 MCG/DOSE AEPB Inhale 1 puff into the lungs 2 (two) times daily.   05/19/2017 at Unknown time  . metroNIDAZOLE (METROGEL) 1 % gel Apply 1 application topically at bedtime.   05/18/2017 at Unknown time  . omeprazole (PRILOSEC) 20 MG capsule Take 20 mg by mouth daily.     Marland Kitchen venlafaxine XR (EFFEXOR-XR) 75 MG 24 hr capsule Take 75 mg by mouth daily with breakfast.   05/19/2017 at Unknown time  . traZODone (DESYREL) 50 MG tablet Take 50 mg by mouth at bedtime.  Not Taking at Unknown time    Results for orders placed or performed during the hospital encounter of 05/19/17 (from the past 48 hour(s))  Basic metabolic panel     Status: Abnormal   Collection Time: 05/19/17 12:34 PM  Result Value Ref Range   Sodium 140 135 - 145 mmol/L   Potassium 4.1 3.5 - 5.1 mmol/L   Chloride 107 101 - 111 mmol/L   CO2 25 22 - 32 mmol/L   Glucose, Bld 133 (H) 65 - 99 mg/dL   BUN 11 6 - 20 mg/dL   Creatinine,  Ser 0.57 0.44 - 1.00 mg/dL   Calcium 9.3 8.9 - 10.3 mg/dL   GFR calc non Af Amer >60 >60 mL/min   GFR calc Af Amer >60 >60 mL/min    Comment: (NOTE) The eGFR has been calculated using the CKD EPI equation. This calculation has not been validated in all clinical situations. eGFR's persistently <60 mL/min signify possible Chronic Kidney Disease.    Anion gap 8 5 - 15  CBC     Status: Abnormal   Collection Time: 05/19/17 12:34 PM  Result Value Ref Range   WBC 7.6 4.0 - 10.5 K/uL   RBC 4.86 3.87 - 5.11 MIL/uL   Hemoglobin 15.5 (H) 12.0 - 15.0 g/dL   HCT 46.7 (H) 36.0 - 46.0 %   MCV 96.1 78.0 - 100.0 fL   MCH 31.9 26.0 - 34.0 pg   MCHC 33.2 30.0 - 36.0 g/dL   RDW 13.1 11.5 - 15.5 %   Platelets 216 150 - 400 K/uL  Lipase, blood     Status: None   Collection Time: 05/19/17 12:34 PM  Result Value Ref Range   Lipase 25 11 - 51 U/L  Hepatic function panel     Status: Abnormal   Collection Time: 05/19/17 12:34 PM  Result Value Ref Range   Total Protein 7.0 6.5 - 8.1 g/dL   Albumin 3.7 3.5 - 5.0 g/dL   AST 23 15 - 41 U/L   ALT 27 14 - 54 U/L   Alkaline Phosphatase 93 38 - 126 U/L   Total Bilirubin 0.6 0.3 - 1.2 mg/dL   Bilirubin, Direct <0.1 (L) 0.1 - 0.5 mg/dL   Indirect Bilirubin NOT CALCULATED 0.3 - 0.9 mg/dL  I-stat troponin, ED     Status: Abnormal   Collection Time: 05/19/17 12:42 PM  Result Value Ref Range   Troponin i, poc 0.09 (HH) 0.00 - 0.08 ng/mL   Comment NOTIFIED PHYSICIAN    Comment 3            Comment: Due to the release kinetics of cTnI, a negative result within the first hours of the onset of symptoms does not rule out myocardial infarction with certainty. If myocardial infarction is still suspected, repeat the test at appropriate intervals.   I-stat troponin, ED     Status: Abnormal   Collection Time: 05/19/17  3:39 PM  Result Value Ref Range   Troponin i, poc 0.88 (HH) 0.00 - 0.08 ng/mL   Comment NOTIFIED PHYSICIAN    Comment 3            Comment: Due  to the release kinetics of cTnI, a negative result within the first hours of the onset of symptoms does not rule out myocardial infarction with certainty. If myocardial infarction is still suspected, repeat the test at appropriate intervals.   Troponin I     Status: Abnormal   Collection Time: 05/19/17  4:20 PM  Result Value Ref Range   Troponin I 0.92 (HH) <0.03 ng/mL    Comment: CRITICAL RESULT CALLED TO, READ BACK BY AND VERIFIED WITH: P.PRICE RN @ [id number] 05/19/17 BY C.EDENS   HIV antibody (Routine Testing)     Status: None   Collection Time: 05/19/17  6:18 PM  Result Value Ref Range   HIV Screen 4th Generation wRfx Non Reactive Non Reactive    Comment: (NOTE) Performed At: [facility] [place], [place] [id number] Lindon Romp MD QP:[id number]   CBC     Status: None   Collection Time: 05/19/17  6:18 PM  Result Value Ref Range   WBC 9.0 4.0 - 10.5 K/uL   RBC 4.62 3.87 - 5.11 MIL/uL   Hemoglobin 14.6 12.0 - 15.0 g/dL   HCT 44.1 36.0 - 46.0 %   MCV 95.5 78.0 - 100.0 fL   MCH 31.6 26.0 - 34.0 pg   MCHC 33.1 30.0 - 36.0 g/dL   RDW 13.1 11.5 - 15.5 %   Platelets 216 150 - 400 K/uL  Creatinine, serum     Status: None   Collection Time: 05/19/17  6:18 PM  Result Value Ref Range   Creatinine, Ser 0.58 0.44 - 1.00 mg/dL   GFR calc non Af Amer >60 >60 mL/min   GFR calc Af Amer >60 >60 mL/min    Comment: (NOTE) The eGFR has been calculated using the CKD EPI equation. This calculation has not been validated in all clinical situations. eGFR's persistently <60 mL/min signify possible Chronic Kidney Disease.   Troponin I     Status: Abnormal   Collection Time: 05/19/17  6:18 PM  Result Value Ref Range   Troponin I 0.80 (HH) <0.03 ng/mL    Comment: CRITICAL VALUE NOTED.  VALUE IS CONSISTENT WITH PREVIOUSLY REPORTED AND CALLED VALUE.  Basic metabolic panel     Status: Abnormal   Collection Time: 05/19/17  6:18 PM  Result Value Ref Range   Sodium 139 135  - 145 mmol/L   Potassium 3.8 3.5 - 5.1 mmol/L   Chloride 106 101 - 111 mmol/L   CO2 26 22 - 32 mmol/L   Glucose, Bld 101 (H) 65 - 99 mg/dL   BUN 10 6 - 20 mg/dL   Creatinine, Ser 0.57 0.44 - 1.00 mg/dL   Calcium 9.0 8.9 - 10.3 mg/dL   GFR calc non Af Amer >60 >60 mL/min   GFR calc Af Amer >60 >60 mL/min    Comment: (NOTE) The eGFR has been calculated using the CKD EPI equation. This calculation has not been validated in all clinical situations. eGFR's persistently <60 mL/min signify possible Chronic Kidney Disease.    Anion gap 7 5 - 15  Protime-INR     Status: None   Collection Time: 05/19/17  6:18 PM  Result Value Ref Range   Prothrombin Time 12.7 11.4 - 15.2 seconds   INR 0.95   CBC     Status: None   Collection Time: 05/19/17  6:18 PM  Result Value Ref Range   WBC 9.4 4.0 - 10.5 K/uL   RBC 4.71 3.87 - 5.11 MIL/uL   Hemoglobin 14.8 12.0 - 15.0 g/dL   HCT 44.9 36.0 - 46.0 %   MCV 95.3 78.0 - 100.0 fL   MCH 31.4 26.0 - 34.0 pg   MCHC 33.0 30.0 - 36.0 g/dL   RDW 13.0 11.5 - 15.5 %   Platelets 229 150 - 400 K/uL  Troponin I     Status: Abnormal   Collection Time: 05/19/17 11:47 PM  Result Value Ref Range   Troponin I 0.37 (HH) <0.03 ng/mL    Comment: CRITICAL VALUE NOTED.  VALUE IS CONSISTENT WITH PREVIOUSLY REPORTED AND CALLED VALUE.  Troponin I     Status: Abnormal   Collection Time: 05/20/17  5:57 AM  Result Value Ref Range   Troponin I 0.20 (HH) <0.03 ng/mL    Comment: CRITICAL VALUE NOTED.  VALUE IS CONSISTENT WITH PREVIOUSLY REPORTED AND CALLED VALUE.  POCT Activated clotting time     Status: None   Collection Time: 05/20/17  9:30 AM  Result Value Ref Range   Activated Clotting Time 230 seconds  Lipid panel     Status: Abnormal   Collection Time: 05/20/17  7:18 PM  Result Value Ref Range   Cholesterol 141 0 - 200 mg/dL   Triglycerides 111 <150 mg/dL   HDL 32 (L) >40 mg/dL   Total CHOL/HDL Ratio 4.4 RATIO   VLDL 22 0 - 40 mg/dL   LDL Cholesterol 87 0 - 99  mg/dL    Comment:        Total Cholesterol/HDL:CHD Risk Coronary Heart Disease Risk Table                     Men   Women  1/2 Average Risk   3.4   3.3  Average Risk       5.0   4.4  2 X Average Risk   9.6   7.1  3 X Average Risk  23.4   11.0        Use the calculated Patient Ratio above and the CHD Risk Table to determine the patient's CHD Risk.        ATP III CLASSIFICATION (LDL):  <100     mg/dL   Optimal  100-129  mg/dL   Near or Above                    Optimal  130-159  mg/dL   Borderline  160-189  mg/dL   High  >190     mg/dL   Very High   Heparin level (unfractionated)     Status: None   Collection Time: 05/21/17  2:00 AM  Result Value Ref Range   Heparin Unfractionated 0.45 0.30 - 0.70 IU/mL    Comment:        IF HEPARIN RESULTS ARE BELOW EXPECTED VALUES, AND PATIENT DOSAGE HAS BEEN CONFIRMED, SUGGEST FOLLOW UP TESTING OF ANTITHROMBIN III LEVELS.   CBC     Status: None   Collection Time: 05/21/17  2:00 AM  Result Value Ref Range   WBC 9.4 4.0 - 10.5 K/uL   RBC 4.19 3.87 - 5.11 MIL/uL   Hemoglobin 13.0 12.0 - 15.0 g/dL   HCT 40.1 36.0 - 46.0 %   MCV 95.7 78.0 - 100.0 fL   MCH 31.0 26.0 - 34.0 pg   MCHC 32.4 30.0 - 36.0 g/dL   RDW 12.9 11.5 - 15.5 %   Platelets 195 150 - 400 K/uL  Heparin level (unfractionated)     Status: None   Collection Time: 05/21/17  7:56 AM  Result Value Ref Range   Heparin Unfractionated 0.50 0.30 - 0.70 IU/mL    Comment:        IF HEPARIN RESULTS ARE BELOW EXPECTED VALUES, AND PATIENT DOSAGE HAS BEEN CONFIRMED, SUGGEST FOLLOW UP TESTING OF ANTITHROMBIN III LEVELS.  Dg Chest 2 View  Result Date: 05/19/2017 CLINICAL DATA:  Epigastric abdominal pain beginning 2 days ago. History of COPD. EXAM: CHEST  2 VIEW COMPARISON:  None. FINDINGS: Normal cardiac silhouette and mediastinal contours. The lungs are hyperexpanded with flattening of the bilateral hemidiaphragms. No focal parenchymal opacities. No pleural effusion or  pneumothorax. No evidence of edema. No acute osseous abnormalities. IMPRESSION: Hyperexpanded lungs without acute cardiopulmonary disease. Specifically, no focal airspace opacities to suggest pneumonia. Electronically Signed   By: Sandi Mariscal M.D.   On: 05/19/2017 12:51   Ct Chest W Contrast  Result Date: 05/19/2017 CLINICAL DATA:  Epigastric pain 72 hours after endoscopy. EXAM: CT CHEST WITHOUT AND WITH CONTRAST TECHNIQUE: Protocol was requested by GI. A without chest CT was obtained. Subsequently Multidetector CT imaging of the chest was performed during intravenous and oral contrast administration. CONTRAST:  30m ISOVUE-300 IOPAMIDOL (ISOVUE-300) INJECTION 61%IV COMPARISON:  None. FINDINGS: Cardiovascular: Normal heart size. No pericardial effusion. No evidence of acute vascular disease. Mild atherosclerotic calcifications seen along the distal brachiocephalic. Mediastinum/Nodes: No pneumomediastinum, esophageal thickening, or extravasation of the oral contrast which is seen throughout the esophagus. Negative for adenopathy or swelling. Lungs/Pleura: Negative for pneumonia or air leak. Upper Abdomen: The stomach was covered and is negative for wall thickening. No epigastric pneumoperitoneum. Musculoskeletal: No acute or aggressive finding. Probable dermal inclusion cyst in the subcutaneous right paramedian lower back. IMPRESSION: No acute finding. There is no pneumomediastinum or extravasation of esophageal contrast. The stomach was visualized and negative. Electronically Signed   By: JMonte FantasiaM.D.   On: 05/19/2017 20:40    Review of Systems  Constitutional: Positive for malaise/fatigue.  Respiratory: Positive for shortness of breath and wheezing.   Cardiovascular: Positive for chest pain. Negative for orthopnea, claudication and leg swelling.  Gastrointestinal: Positive for abdominal pain.  All other systems reviewed and are negative.  Blood pressure 118/69, pulse 80, temperature 98.4 F  (36.9 C), temperature source Oral, resp. rate 19, height 5' (1.524 m), weight 164 lb 4.8 oz (74.5 kg), SpO2 97 %. Physical Exam  Vitals reviewed. Constitutional: She is oriented to person, place, and time. She appears well-developed and well-nourished. No distress.  HENT:  Head: Normocephalic and atraumatic.  Mouth/Throat: No oropharyngeal exudate.  Eyes: Conjunctivae and EOM are normal. No scleral icterus.  Neck: Neck supple. No thyromegaly present.  Cardiovascular: Normal rate, regular rhythm, normal heart sounds and intact distal pulses.  Exam reveals no gallop and no friction rub.   No murmur heard. Respiratory: Effort normal and breath sounds normal. No respiratory distress. She has no wheezes. She has no rales.  GI: Soft. She exhibits no distension. There is no tenderness.  Musculoskeletal: She exhibits no edema.  Lymphadenopathy:    She has no cervical adenopathy.  Neurological: She is alert and oriented to person, place, and time. No cranial nerve deficit. She exhibits normal muscle tone.  Skin: Skin is warm and dry.   Cardiac catheterization Conclusion   Conclusions: 1. 60% ostial LAD stenosis that is not hemodynamically significant by FFR (0.88). Mild disease also noted in the distal LMCA, LCx, and RCA. 2. Focal mid inferior akinesis with otherwise preserved LV contraction. No culprit lesion to explain wall motion abnormality. Takotsubo variant would be a consideration. 3. Mildly elevated left ventricular filling pressure.  Recommendations: 1. Aggressive medical therapy, including addition of high-intensity statin therapy and beta-blockade. 2. Proceed with echo to confirm inferior wall-motion abnormality.  CNelva Bush MD [facility] Pager: [phone]  Echocardiogram Study Conclusions  - Left ventricle: The cavity size was normal. Systolic function was   normal. The estimated ejection fraction was in the range of 60%   to 65%. Wall motion was normal;  there were no regional wall   motion abnormalities. Doppler parameters are consistent with   abnormal left ventricular relaxation (grade 1 diastolic   dysfunction). There was no evidence of elevated ventricular   filling pressure by Doppler parameters. - Aortic valve: There was trivial regurgitation. - Mitral valve: Structurally normal valve. There was mild   regurgitation. - Right ventricle: Systolic function was normal. - Right atrium: The atrium was normal in size. - Tricuspid valve: There was no regurgitation. - Pulmonary arteries: Systolic pressure was within the normal   range. - Inferior vena cava: The vessel was normal in size. - Pericardium, extracardiac: There was no pericardial effusion. I personally reviewed the cardiac catheterization films. There appears to be a 75-80% stenosis of the ostial LAD. There is some distal LAD disease. No disease of the circumflex or right coronary.  Assessment/Plan: 61 year old woman with a history of tobacco abuse but no other known cardiac risk factors who presents with unstable chest pain. She describes this as a squeezing or pressure sensation substernally, which sounds classic for angina. Her troponins were elevated on admission. She's had continued unstable chest pain symptoms while in the hospital. She has a significant ostial LAD lesion. The FFR would suggest that this is not hemodynamically significant but her symptoms suggest otherwise. In my opinion the safest approach would be to bypass the LAD. It is not 100% guaranteed that her pain will resolve, but I would be uncomfortable treating her medically given her unstable symptoms post catheterization.  I have discussed the general nature of the procedure, the need for general anesthesia, the possible need for CPB, and the incisions to be used with Sharon Cochran and her family. We discussed the expected hospital stay, overall recovery and short and long term outcomes. I reviewed the  indications, risks, benefits, and alternatives. They understand the risks include, but are not limited to death, stroke, MI, DVT/PE, bleeding, possible need for transfusion, infections, cardiac arrhythmias, as well as other organ system dysfunction including respiratory, renal, or GI complications.   She accepts the risks and agrees to proceed.  For CABG in a.m. 05/22/2017.  Melrose Nakayama 05/21/2017, 10:20 AM

## 2017-05-22 NOTE — Plan of Care (Signed)
Problem: Pain Managment: Goal: General experience of comfort will improve Outcome: Progressing Pain controlled by tramadol and nitro drip.   Problem: Tissue Perfusion: Goal: Risk factors for ineffective tissue perfusion will decrease CABG prep this evening. Heparin to be stopped on call. Pre-Medication given.

## 2017-05-22 NOTE — Brief Op Note (Addendum)
05/19/2017 - 05/22/2017  11:20 AM  PATIENT:  Rolin BarryLaura L Maxcy  61 y.o. female  PRE-OPERATIVE DIAGNOSIS:  Single vessel CAD with unstable angina  POST-OPERATIVE DIAGNOSIS:  Single vessel CAD with unstable angina  PROCEDURE:  Procedure(s): OFF PUMP CORONARY ARTERY BYPASS GRAFTING (CABG) x1 using left internal mammary artery (N/A) TRANSESOPHAGEAL ECHOCARDIOGRAM (TEE) (N/A)  SURGEON:  Surgeon(s) and Role:    Loreli Slot* Hendrickson, Steven C, MD - Primary  PHYSICIAN ASSISTANT:  Jari Favreessa Conte, PA-C   ANESTHESIA:   general  EBL:  Total I/O In: 1000 [I.V.:1000] Out: -   BLOOD ADMINISTERED:none  DRAINS: ROUTINE   LOCAL MEDICATIONS USED:  NONE  SPECIMEN:  No Specimen  DISPOSITION OF SPECIMEN:  N/A  COUNTS:  YES  PLAN OF CARE: Admit to inpatient   PATIENT DISPOSITION:  ICU - intubated and hemodynamically stable.   Delay start of Pharmacological VTE agent (>24hrs) due to surgical blood loss or risk of bleeding: yes  LIMA small but did accept 1.5 mm probe, excellent flow. LAD good quality

## 2017-05-22 NOTE — Anesthesia Preprocedure Evaluation (Signed)
Anesthesia Evaluation  Patient identified by MRN, date of birth, ID band Patient awake    Reviewed: Allergy & Precautions, NPO status , Patient's Chart, lab work & pertinent test results  History of Anesthesia Complications Negative for: history of anesthetic complications  Airway Mallampati: II  TM Distance: >3 FB Neck ROM: Full    Dental  (+) Edentulous Upper, Edentulous Lower   Pulmonary COPD, Current Smoker,    breath sounds clear to auscultation       Cardiovascular + angina + CAD and + Past MI   Rhythm:Regular     Neuro/Psych negative neurological ROS  negative psych ROS   GI/Hepatic Neg liver ROS, GERD  Medicated and Controlled,  Endo/Other  Morbid obesity  Renal/GU negative Renal ROS     Musculoskeletal   Abdominal   Peds  Hematology negative hematology ROS (+)   Anesthesia Other Findings   Reproductive/Obstetrics                             Anesthesia Physical Anesthesia Plan  ASA: IV  Anesthesia Plan: General   Post-op Pain Management:    Induction: Intravenous  PONV Risk Score and Plan: 2 and Ondansetron and Treatment may vary due to age  Airway Management Planned: Oral ETT  Additional Equipment: Arterial line, CVP, Ultrasound Guidance Line Placement and TEE  Intra-op Plan:   Post-operative Plan: Post-operative intubation/ventilation  Informed Consent: I have reviewed the patients History and Physical, chart, labs and discussed the procedure including the risks, benefits and alternatives for the proposed anesthesia with the patient or authorized representative who has indicated his/her understanding and acceptance.   Dental advisory given  Plan Discussed with: CRNA and Surgeon  Anesthesia Plan Comments:         Anesthesia Quick Evaluation

## 2017-05-22 NOTE — Progress Notes (Signed)
   Clinically successful surgery. LIMA to LAD.  Post surgical EKG is nonischemic with persistent inferior T-wave abnormality.

## 2017-05-22 NOTE — Progress Notes (Signed)
RN called states pt is ready to start Rapid Wean protocol.

## 2017-05-22 NOTE — Progress Notes (Signed)
CT surgery p.m. Rounds  Patient doing well after off-pump left IMA to LAD Ventilator weaning process has been started Hemodynamics stable Lungs clear Minimal chest tube drainage Postop labs satisfactory -initially PCO2 was elevated but this is improving over time

## 2017-05-22 NOTE — Interval H&P Note (Signed)
History and Physical Interval Note:  05/22/2017 7:58 AM  Sharon Cochran  has presented today for surgery, with the diagnosis of CAD  The various methods of treatment have been discussed with the patient and family. After consideration of risks, benefits and other options for treatment, the patient has consented to  Procedure(s): OFF PUMP CORONARY ARTERY BYPASS GRAFTING (CABG) (N/A) TRANSESOPHAGEAL ECHOCARDIOGRAM (TEE) (N/A) as a surgical intervention .  The patient's history has been reviewed, patient examined, no change in status, stable for surgery.  I have reviewed the patient's chart and labs.  Questions were answered to the patient's satisfaction.     Loreli SlotSteven C Dent Plantz

## 2017-05-23 ENCOUNTER — Encounter (HOSPITAL_COMMUNITY): Payer: Self-pay | Admitting: Thoracic Surgery (Cardiothoracic Vascular Surgery)

## 2017-05-23 ENCOUNTER — Inpatient Hospital Stay (HOSPITAL_COMMUNITY): Payer: BLUE CROSS/BLUE SHIELD

## 2017-05-23 DIAGNOSIS — E782 Mixed hyperlipidemia: Secondary | ICD-10-CM

## 2017-05-23 DIAGNOSIS — Z951 Presence of aortocoronary bypass graft: Secondary | ICD-10-CM

## 2017-05-23 LAB — BASIC METABOLIC PANEL
Anion gap: 7 (ref 5–15)
BUN: 8 mg/dL (ref 6–20)
CALCIUM: 8.3 mg/dL — AB (ref 8.9–10.3)
CHLORIDE: 102 mmol/L (ref 101–111)
CO2: 24 mmol/L (ref 22–32)
CREATININE: 0.6 mg/dL (ref 0.44–1.00)
GFR calc non Af Amer: >60 mL/min (ref >60)
GLUCOSE: 100 mg/dL — AB (ref 65–99)
Potassium: 4.2 mmol/L (ref 3.5–5.1)
Sodium: 133 mmol/L — ABNORMAL LOW (ref 135–145)

## 2017-05-23 LAB — POCT I-STAT 3, ART BLOOD GAS (G3+)
ACID-BASE DEFICIT: 1 mmol/L (ref 0.0–2.0)
ACID-BASE DEFICIT: 1 mmol/L (ref 0.0–2.0)
ACID-BASE DEFICIT: 1 mmol/L (ref 0.0–2.0)
Acid-base deficit: 1 mmol/L (ref 0.0–2.0)
BICARBONATE: 26 mmol/L (ref 20.0–28.0)
Bicarbonate: 25.1 mmol/L (ref 20.0–28.0)
Bicarbonate: 25.5 mmol/L (ref 20.0–28.0)
Bicarbonate: 25.5 mmol/L (ref 20.0–28.0)
Bicarbonate: 26 mmol/L (ref 20.0–28.0)
O2 SAT: 92 %
O2 Saturation: 89 %
O2 Saturation: 91 %
O2 Saturation: 92 %
O2 Saturation: 93 %
PCO2 ART: 43.5 mmHg (ref 32.0–48.0)
PCO2 ART: 49.6 mmHg — AB (ref 32.0–48.0)
PCO2 ART: 51.2 mmHg — AB (ref 32.0–48.0)
PH ART: 7.293 — AB (ref 7.350–7.450)
PH ART: 7.308 — AB (ref 7.350–7.450)
PH ART: 7.312 — AB (ref 7.350–7.450)
PH ART: 7.324 — AB (ref 7.350–7.450)
PH ART: 7.371 (ref 7.350–7.450)
PO2 ART: 62 mmHg — AB (ref 83.0–108.0)
PO2 ART: 74 mmHg — AB (ref 83.0–108.0)
Patient temperature: 99.6
Patient temperature: 99.6
TCO2: 26 mmol/L (ref 0–100)
TCO2: 27 mmol/L (ref 0–100)
TCO2: 27 mmol/L (ref 0–100)
TCO2: 28 mmol/L (ref 0–100)
TCO2: 28 mmol/L (ref 0–100)
pCO2 arterial: 51.4 mmHg — ABNORMAL HIGH (ref 32.0–48.0)
pCO2 arterial: 53.7 mmHg — ABNORMAL HIGH (ref 32.0–48.0)
pO2, Arterial: 64 mmHg — ABNORMAL LOW (ref 83.0–108.0)
pO2, Arterial: 71 mmHg — ABNORMAL LOW (ref 83.0–108.0)
pO2, Arterial: 77 mmHg — ABNORMAL LOW (ref 83.0–108.0)

## 2017-05-23 LAB — POCT I-STAT, CHEM 8
BUN: 9 mg/dL (ref 6–20)
CALCIUM ION: 1.27 mmol/L (ref 1.15–1.40)
Chloride: 95 mmol/L — ABNORMAL LOW (ref 101–111)
Creatinine, Ser: 0.6 mg/dL (ref 0.44–1.00)
Glucose, Bld: 108 mg/dL — ABNORMAL HIGH (ref 65–99)
HEMATOCRIT: 37 % (ref 36.0–46.0)
HEMOGLOBIN: 12.6 g/dL (ref 12.0–15.0)
Potassium: 4 mmol/L (ref 3.5–5.1)
SODIUM: 136 mmol/L (ref 135–145)
TCO2: 27 mmol/L (ref 0–100)

## 2017-05-23 LAB — HEMOGLOBIN A1C
Hgb A1c MFr Bld: 6 % — ABNORMAL HIGH (ref 4.8–5.6)
Mean Plasma Glucose: 126 mg/dL

## 2017-05-23 LAB — CBC
HCT: 38.5 % (ref 36.0–46.0)
HEMATOCRIT: 39.3 % (ref 36.0–46.0)
HEMOGLOBIN: 12.6 g/dL (ref 12.0–15.0)
Hemoglobin: 12.1 g/dL (ref 12.0–15.0)
MCH: 31 pg (ref 26.0–34.0)
MCH: 30.9 pg (ref 26.0–34.0)
MCHC: 32.1 g/dL (ref 30.0–36.0)
MCHC: 31.4 g/dL (ref 30.0–36.0)
MCV: 96.6 fL (ref 78.0–100.0)
MCV: 98.5 fL (ref 78.0–100.0)
PLATELETS: 166 10*3/uL (ref 150–400)
Platelets: 178 10*3/uL (ref 150–400)
RBC: 4.07 MIL/uL (ref 3.87–5.11)
RBC: 3.91 MIL/uL (ref 3.87–5.11)
RDW: 13 % (ref 11.5–15.5)
RDW: 13.3 % (ref 11.5–15.5)
WBC: 12 10*3/uL — ABNORMAL HIGH (ref 4.0–10.5)
WBC: 12 10*3/uL — AB (ref 4.0–10.5)

## 2017-05-23 LAB — GLUCOSE, CAPILLARY
GLUCOSE-CAPILLARY: 94 mg/dL (ref 65–99)
GLUCOSE-CAPILLARY: 83 mg/dL (ref 65–99)
GLUCOSE-CAPILLARY: 108 mg/dL — AB (ref 65–99)
GLUCOSE-CAPILLARY: 113 mg/dL — AB (ref 65–99)
Glucose-Capillary: 90 mg/dL (ref 65–99)
Glucose-Capillary: 106 mg/dL — ABNORMAL HIGH (ref 65–99)

## 2017-05-23 LAB — CREATININE, SERUM: CREATININE: 0.66 mg/dL (ref 0.44–1.00)

## 2017-05-23 LAB — MAGNESIUM
MAGNESIUM: 1.8 mg/dL (ref 1.7–2.4)
Magnesium: 2 mg/dL (ref 1.7–2.4)

## 2017-05-23 MED ORDER — PANTOPRAZOLE SODIUM 40 MG PO TBEC
40.0000 mg | DELAYED_RELEASE_TABLET | Freq: Two times a day (BID) | ORAL | Status: DC
  Administered 2017-05-23 – 2017-05-26 (×6): 40 mg via ORAL
  Filled 2017-05-23 (×6): qty 1

## 2017-05-23 MED ORDER — NICOTINE 14 MG/24HR TD PT24
14.0000 mg | MEDICATED_PATCH | Freq: Every day | TRANSDERMAL | Status: DC
  Administered 2017-05-23 – 2017-05-26 (×4): 14 mg via TRANSDERMAL
  Filled 2017-05-23 (×4): qty 1

## 2017-05-23 MED ORDER — ENOXAPARIN SODIUM 40 MG/0.4ML ~~LOC~~ SOLN
40.0000 mg | Freq: Every day | SUBCUTANEOUS | Status: DC
  Administered 2017-05-23 – 2017-05-25 (×3): 40 mg via SUBCUTANEOUS
  Filled 2017-05-23 (×3): qty 0.4

## 2017-05-23 MED ORDER — SODIUM CHLORIDE 0.9% FLUSH: 10.0000 mL | INTRAVENOUS | Status: DC | PRN

## 2017-05-23 MED ORDER — METOPROLOL TARTRATE 25 MG PO TABS
25.0000 mg | ORAL_TABLET | Freq: Two times a day (BID) | ORAL | Status: DC
  Administered 2017-05-24 – 2017-05-26 (×5): 25 mg via ORAL
  Filled 2017-05-23 (×5): qty 1

## 2017-05-23 MED ORDER — INSULIN ASPART 100 UNIT/ML ~~LOC~~ SOLN: 0.0000 [IU] | SUBCUTANEOUS | Status: DC

## 2017-05-23 MED ORDER — ENOXAPARIN SODIUM 40 MG/0.4ML ~~LOC~~ SOLN: 40.0000 mg | Freq: Every day | SUBCUTANEOUS | Status: DC

## 2017-05-23 MED ORDER — SODIUM CHLORIDE 0.9% FLUSH
10.0000 mL | Freq: Two times a day (BID) | INTRAVENOUS | Status: DC
  Administered 2017-05-23 (×2): 10 mL

## 2017-05-23 MED ORDER — CHLORHEXIDINE GLUCONATE CLOTH 2 % EX PADS
6.0000 | MEDICATED_PAD | Freq: Every day | CUTANEOUS | Status: DC
  Administered 2017-05-23: 6 via TOPICAL

## 2017-05-23 NOTE — Progress Notes (Signed)
CARDIOLOGY PROGRESS NOTE   She is awake and alert. Having surgical/sternal incision discomfort.  Hemodynamics and rhythm have been stable. No neurological abnormalities noted by cursory exam.  EKG is essentially normal with inferior T-wave abnormality present for several days now resolved.  Plan risk factor modification: Smoking cessation, LDL less than 70, A1c less than 7, blood pressure target 130/90 mmHg or less.  Great outcome thus far.

## 2017-05-23 NOTE — Progress Notes (Signed)
1 Day Post-Op Procedure(s) (LRB): OFF PUMP CORONARY ARTERY BYPASS GRAFTING (CABG) x1 using left internal mammary artery (N/A) TRANSESOPHAGEAL ECHOCARDIOGRAM (TEE) (N/A) Subjective: Some incisional pain, no nausea  Objective: Vital signs in last 24 hours: Temp:  [97.5 F (36.4 C)-99.9 F (37.7 C)] 99.4 F (37.4 C) (06/07 0700) Pulse Rate:  [64-91] 91 (06/07 0600) Cardiac Rhythm: Normal sinus rhythm (06/06 2000) Resp:  [10-21] 10 (06/07 0600) BP: (72-121)/(42-76) 119/70 (06/07 0600) SpO2:  [94 %-100 %] 96 % (06/07 0600) Arterial Line BP: (98-167)/(47-70) 153/58 (06/07 0600) FiO2 (%):  [40 %-50 %] 40 % (06/06 2006) Weight:  [169 lb 6.4 oz (76.8 kg)] 169 lb 6.4 oz (76.8 kg) (06/07 0400)  Hemodynamic parameters for last 24 hours: CVP:  [8 mmHg-19 mmHg] 14 mmHg CO:  [8 L/min] 8 L/min  Intake/Output from previous day: 06/06 0701 - 06/07 0700 In: 4361.2 [P.O.:240; I.V.:3271.2; IV Piggyback:850] Out: 3735 [Urine:2650; Blood:200; Chest Tube:885] Intake/Output this shift: No intake/output data recorded.  General appearance: alert, cooperative and no distress Neurologic: intact Heart: regular rate and rhythm Lungs: diminished breath sounds bibasilar Abdomen: normal findings: soft, non-tender  Lab Results:  Recent Labs  05/22/17 1830 05/23/17 0407  WBC 11.2* 12.0*  HGB 12.8 12.6  HCT 40.2 39.3  PLT 183 178   BMET:  Recent Labs  05/22/17 0559  05/22/17 1829 05/22/17 1830 05/23/17 0407  NA 135  < > 140  --  133*  K 3.7  < > 5.3*  --  4.2  CL 101  < > 105  --  102  CO2 26  --   --   --  24  GLUCOSE 115*  < > 122*  --  100*  BUN 14  < > 14  --  8  CREATININE 0.67  < > 0.50 0.64 0.60  CALCIUM 8.7*  --   --   --  8.3*  < > = values in this interval not displayed.  PT/INR:  Recent Labs  05/22/17 1236  LABPROT 14.4  INR 1.12   ABG    Component Value Date/Time   PHART 7.324 (L) 05/23/2017 0054   HCO3 25.5 05/23/2017 0054   TCO2 27 05/23/2017 0054   ACIDBASEDEF  1.0 05/23/2017 0054   O2SAT 93.0 05/23/2017 0054   CBG (last 3)   Recent Labs  05/22/17 2350 05/23/17 0359 05/23/17 0756  GLUCAP 104* 94 90    Assessment/Plan: S/P Procedure(s) (LRB): OFF PUMP CORONARY ARTERY BYPASS GRAFTING (CABG) x1 using left internal mammary artery (N/A) TRANSESOPHAGEAL ECHOCARDIOGRAM (TEE) (N/A) -  POD # 1 CABG x 1  CV- stable- ASA, statin, beta blocker  RESP- delayed extubation secondary to CO2 retention  No wheezing this AM- IS, nebs PRN  nicoderm to assist with smoking cessation  RENAL- creatinine and lytes OK  ENDO- CBG well controlled- change to AC/HS  SCD + enoxaparin for DVT prophylaxis  Dc chest tubes  Mobilize    LOS: 2 days    Sharon Cochran 05/23/2017

## 2017-05-23 NOTE — Procedures (Signed)
Extubation Procedure Note  Patient Details:   Name: Sharon FeeLaura L Adamcik DOB: Dec 26, 1955 MRN: [id number]   Airway Documentation:     Evaluation  O2 sats: stable throughout Complications: No apparent complications Patient did tolerate procedure well. Bilateral Breath Sounds: Clear   Yes, pt able to cough to clear secretions and able to vocalize name. Pt had VC of 1.6L and NIF of -28. Pt placed on 4L humidified [place] and is tolerating well.   Tacy Learnurriff, Reznor Ferrando E 05/23/2017, 2:26 AM

## 2017-05-23 NOTE — Progress Notes (Signed)
Called [employer] with ABG results. Gave instructions to proceed with extubation and follow up with ABG.

## 2017-05-23 NOTE — Care Management Note (Signed)
Case Management Note Donn PieriniKristi Gola Bribiesca RN, BSN Unit 2W-Case Manager-- 2H coverage [phone]  Patient Details  Name: Rachael FeeLaura L Plouff MRN: [id number] Date of Birth: 08-13-1956  Subjective/Objective:    Pt admitted with C/P- now s/p CABGx1 on 05/22/17                Action/Plan: PTA pt lived at home with spouse- anticipate return home - CM to follow  Expected Discharge Date:                  Expected Discharge Plan:  Home/Self Care  In-House Referral:     Discharge planning Services  CM Consult  Post Acute Care Choice:    Choice offered to:     DME Arranged:    DME Agency:     HH Arranged:    HH Agency:     Status of Service:  In process, will continue to follow  If discussed at Long Length of Stay Meetings, dates discussed:    Discharge Disposition:   Additional Comments:  Darrold SpanWebster, Madolin Twaddle [place], RN 05/23/2017, 12:10 PM

## 2017-05-23 NOTE — Progress Notes (Signed)
      [address]       [place] [id number]             [phone]      Asleep at present  Ambulated around unit earlier  BP 110/62   Pulse 83   Temp 99.1 F (37.3 C) (Oral)   Resp 16   Ht 5' (1.524 m)   Wt 169 lb 6.4 oz (76.8 kg)   SpO2 95%   BMI 33.08 kg/m    Intake/Output Summary (Last 24 hours) at 05/23/17 1731 Last data filed at 05/23/17 1400  Gross per 24 hour  Intake           1449.3 ml  Output             1950 ml  Net           -500.7 ml   K= 4.0, Hct= 37  Doing well POD # 1  Steven C. Dorris FetchHendrickson, MD [facility] [phone]

## 2017-05-24 ENCOUNTER — Inpatient Hospital Stay (HOSPITAL_COMMUNITY): Payer: BLUE CROSS/BLUE SHIELD

## 2017-05-24 LAB — BASIC METABOLIC PANEL
ANION GAP: 8 (ref 5–15)
BUN: 7 mg/dL (ref 6–20)
CALCIUM: 8.4 mg/dL — AB (ref 8.9–10.3)
CO2: 27 mmol/L (ref 22–32)
Chloride: 99 mmol/L — ABNORMAL LOW (ref 101–111)
Creatinine, Ser: 0.59 mg/dL (ref 0.44–1.00)
GFR calc Af Amer: >60 mL/min (ref >60)
GFR calc non Af Amer: >60 mL/min (ref >60)
GLUCOSE: 118 mg/dL — AB (ref 65–99)
POTASSIUM: 4.1 mmol/L (ref 3.5–5.1)
Sodium: 134 mmol/L — ABNORMAL LOW (ref 135–145)

## 2017-05-24 LAB — CBC
HEMATOCRIT: 37.5 % (ref 36.0–46.0)
HEMOGLOBIN: 12 g/dL (ref 12.0–15.0)
MCH: 31.1 pg (ref 26.0–34.0)
MCHC: 32 g/dL (ref 30.0–36.0)
MCV: 97.2 fL (ref 78.0–100.0)
Platelets: 173 10*3/uL (ref 150–400)
RBC: 3.86 MIL/uL — ABNORMAL LOW (ref 3.87–5.11)
RDW: 13.2 % (ref 11.5–15.5)
WBC: 11.5 10*3/uL — ABNORMAL HIGH (ref 4.0–10.5)

## 2017-05-24 LAB — GLUCOSE, CAPILLARY: Glucose-Capillary: 108 mg/dL — ABNORMAL HIGH (ref 65–99)

## 2017-05-24 MED ORDER — TRAZODONE HCL 50 MG PO TABS: 50.0000 mg | ORAL_TABLET | Freq: Every evening | ORAL | Status: DC | PRN

## 2017-05-24 MED ORDER — SODIUM CHLORIDE 0.9 % IV SOLN: 250.0000 mL | INTRAVENOUS | Status: DC | PRN

## 2017-05-24 MED ORDER — DICLOFENAC POTASSIUM 50 MG PO TABS: 50.0000 mg | ORAL_TABLET | Freq: Two times a day (BID) | ORAL | Status: DC

## 2017-05-24 MED ORDER — ALUM & MAG HYDROXIDE-SIMETH 200-200-20 MG/5ML PO SUSP: 15.0000 mL | Freq: Four times a day (QID) | ORAL | Status: DC | PRN

## 2017-05-24 MED ORDER — SODIUM CHLORIDE 0.9% FLUSH: 3.0000 mL | INTRAVENOUS | Status: DC | PRN

## 2017-05-24 MED ORDER — MAGNESIUM HYDROXIDE 400 MG/5ML PO SUSP: 30.0000 mL | Freq: Every day | ORAL | Status: DC | PRN

## 2017-05-24 MED ORDER — POTASSIUM CHLORIDE CRYS ER 20 MEQ PO TBCR
20.0000 meq | EXTENDED_RELEASE_TABLET | Freq: Two times a day (BID) | ORAL | Status: DC
  Administered 2017-05-24 – 2017-05-26 (×5): 20 meq via ORAL
  Filled 2017-05-24 (×5): qty 1

## 2017-05-24 MED ORDER — FUROSEMIDE 40 MG PO TABS
40.0000 mg | ORAL_TABLET | Freq: Every day | ORAL | Status: AC
  Administered 2017-05-24 – 2017-05-26 (×3): 40 mg via ORAL
  Filled 2017-05-24 (×3): qty 1

## 2017-05-24 MED ORDER — VENLAFAXINE HCL ER 75 MG PO CP24
75.0000 mg | ORAL_CAPSULE | Freq: Every day | ORAL | Status: DC
  Administered 2017-05-24 – 2017-05-26 (×3): 75 mg via ORAL
  Filled 2017-05-24 (×3): qty 1

## 2017-05-24 MED ORDER — ALBUTEROL SULFATE (2.5 MG/3ML) 0.083% IN NEBU
2.5000 mg | INHALATION_SOLUTION | Freq: Four times a day (QID) | RESPIRATORY_TRACT | Status: DC | PRN
  Administered 2017-05-24 – 2017-05-25 (×2): 2.5 mg via RESPIRATORY_TRACT
  Filled 2017-05-24 (×2): qty 3

## 2017-05-24 MED ORDER — GUAIFENESIN-DM 100-10 MG/5ML PO SYRP: 15.0000 mL | ORAL_SOLUTION | ORAL | Status: DC | PRN

## 2017-05-24 MED ORDER — DICLOFENAC SODIUM 50 MG PO TBEC
50.0000 mg | DELAYED_RELEASE_TABLET | Freq: Two times a day (BID) | ORAL | Status: DC
  Administered 2017-05-24 – 2017-05-26 (×4): 50 mg via ORAL
  Filled 2017-05-24 (×6): qty 1

## 2017-05-24 MED ORDER — MOVING RIGHT ALONG BOOK
Freq: Once | Status: AC
  Administered 2017-05-24: 1
  Filled 2017-05-24: qty 1

## 2017-05-24 MED ORDER — SODIUM CHLORIDE 0.9% FLUSH
3.0000 mL | Freq: Two times a day (BID) | INTRAVENOUS | Status: DC
  Administered 2017-05-24 – 2017-05-26 (×3): 3 mL via INTRAVENOUS

## 2017-05-24 MED FILL — Cefuroxime Sodium For IV Soln 1.5 GM: INTRAVENOUS | Qty: 1.5 | Status: AC

## 2017-05-24 MED FILL — Cefuroxime Sodium For Inj 750 MG: INTRAMUSCULAR | Qty: 750 | Status: AC

## 2017-05-24 NOTE — Discharge Instructions (Signed)
Coronary Artery Bypass Grafting, Care After °These instructions give you information on caring for yourself after your procedure. Your doctor may also give you more specific instructions. Call your doctor if you have any problems or questions after your procedure. °Follow these instructions at home: °· Only take medicine as told by your doctor. Take medicines exactly as told. Do not stop taking medicines or start any new medicines without talking to your doctor first. °· Take your pulse as told by your doctor. °· Do deep breathing as told by your doctor. Use your breathing device (incentive spirometer), if given, to practice deep breathing several times a day. Support your chest with a pillow or your arms when you take deep breaths or cough. °· Keep the area clean, dry, and protected where the surgery cuts (incisions) were made. Remove bandages (dressings) only as told by your doctor. If strips were applied to surgical area, do not take them off. They fall off on their own. °· Check the surgery area daily for puffiness (swelling), redness, or leaking fluid. °· If surgery cuts were made in your legs: °? Avoid crossing your legs. °? Avoid sitting for long periods of time. Change positions every 30 minutes. °? Raise your legs when you are sitting. Place them on pillows. °· Wear stockings that help keep blood clots from forming in your legs (compression stockings). °· Only take sponge baths until your doctor says it is okay to take showers. Pat the surgery area dry. Do not rub the surgery area with a washcloth or towel. Do not bathe, swim, or use a hot tub until your doctor says it is okay. °· Eat foods that are high in fiber. These include raw fruits and vegetables, whole grains, beans, and nuts. Choose lean meats. Avoid canned, processed, and fried foods. °· Drink enough fluids to keep your pee (urine) clear or pale yellow. °· Weigh yourself every day. °· Rest and limit activity as told by your doctor. You may be told  to: °? Stop any activity if you have chest pain, shortness of breath, changes in heartbeat, or dizziness. Get help right away if this happens. °? Move around often for short amounts of time or take short walks as told by your doctor. Gradually become more active. You may need help to strengthen your muscles and build endurance. °? Avoid lifting, pushing, or pulling anything heavier than 10 pounds (4.5 kg) for at least 6 weeks after surgery. °· Do not drive until your doctor says it is okay. °· Ask your doctor when you can go back to work. °· Ask your doctor when you can begin sexual activity again. °· Follow up with your doctor as told. °Contact a doctor if: °· You have puffiness, redness, more pain, or fluid draining from the incision site. °· You have a fever. °· You have puffiness in your ankles or legs. °· You have pain in your legs. °· You gain 2 or more pounds (0.9 kg) a day. °· You feel sick to your stomach (nauseous) or throw up (vomit). °· You have watery poop (diarrhea). °Get help right away if: °· You have chest pain that goes to your jaw or arms. °· You have shortness of breath. °· You have a fast or irregular heartbeat. °· You notice a "clicking" in your breastbone when you move. °· You have numbness or weakness in your arms or legs. °· You feel dizzy or light-headed. °This information is not intended to replace advice given to you by   your health care provider. Make sure you discuss any questions you have with your health care provider. °Document Released: 12/08/2013 Document Revised: 05/10/2016 Document Reviewed: 05/12/2013 °Elsevier Interactive Patient Education © 2017 Elsevier Inc. ° °

## 2017-05-24 NOTE — Progress Notes (Signed)
Transferred to 2W07 via wheelchair portable monitor on. No changes. Report given to Leotis ShamesLauren, RN

## 2017-05-24 NOTE — Progress Notes (Signed)
2 Days Post-Op Procedure(s) (LRB): OFF PUMP CORONARY ARTERY BYPASS GRAFTING (CABG) x1 using left internal mammary artery (N/A) TRANSESOPHAGEAL ECHOCARDIOGRAM (TEE) (N/A) Subjective: "I think I overdid it yesterday" feels tired this AM  Objective: Vital signs in last 24 hours: Temp:  [97.9 F (36.6 C)-99.4 F (37.4 C)] 98.1 F (36.7 C) (06/08 0400) Pulse Rate:  [80-97] 97 (06/08 0600) Cardiac Rhythm: Normal sinus rhythm (06/07 2000) Resp:  [10-20] 15 (06/08 0600) BP: (82-127)/(42-106) 97/57 (06/08 0600) SpO2:  [93 %-97 %] 94 % (06/08 0600) Arterial Line BP: (168)/(59) 168/59 (06/07 0800) Weight:  [168 lb 10.4 oz (76.5 kg)] 168 lb 10.4 oz (76.5 kg) (06/08 0500)  Hemodynamic parameters for last 24 hours: CVP:  [11 mmHg] 11 mmHg  Intake/Output from previous day: 06/07 0701 - 06/08 0700 In: 1251.9 [P.O.:600; I.V.:601.9; IV Piggyback:50] Out: 1980 [Urine:1960; Chest Tube:20] Intake/Output this shift: No intake/output data recorded.  General appearance: alert, cooperative and no distress Neurologic: intact Heart: regular rate and rhythm Lungs: diminished breath sounds bibasilar Abdomen: normal findings: soft, non-tender  Lab Results:  Recent Labs  05/23/17 1615 05/23/17 1622 05/24/17 0411  WBC 12.0*  --  11.5*  HGB 12.1 12.6 12.0  HCT 38.5 37.0 37.5  PLT 166  --  173   BMET:  Recent Labs  05/23/17 0407  05/23/17 1622 05/24/17 0411  NA 133*  --  136 134*  K 4.2  --  4.0 4.1  CL 102  --  95* 99*  CO2 24  --   --  27  GLUCOSE 100*  --  108* 118*  BUN 8  --  9 7  CREATININE 0.60  < > 0.60 0.59  CALCIUM 8.3*  --   --  8.4*  < > = values in this interval not displayed.  PT/INR:  Recent Labs  05/22/17 1236  LABPROT 14.4  INR 1.12   ABG    Component Value Date/Time   PHART 7.324 (L) 05/23/2017 0054   HCO3 25.5 05/23/2017 0054   TCO2 27 05/23/2017 1622   ACIDBASEDEF 1.0 05/23/2017 0054   O2SAT 93.0 05/23/2017 0054   CBG (last 3)   Recent Labs  05/23/17 1959 05/23/17 2306 05/24/17 0343  GLUCAP 106* 113* 108*    Assessment/Plan: S/P Procedure(s) (LRB): OFF PUMP CORONARY ARTERY BYPASS GRAFTING (CABG) x1 using left internal mammary artery (N/A) TRANSESOPHAGEAL ECHOCARDIOGRAM (TEE) (N/A) -POD # 2 OPCAB x 1  CV- stable in SR, no further angina  RESP- continue IS, CXR shows some small bilateral effusions and atelectasis  RENAL- creatinine stable, still above preop weight - will give PO lasix  ENDO- CBG well controlled- dc CBG  SCD + enoxaparin for DVT prophylaxis  Continue cardiac rehab  Transfer to 2 west  [place] need a Cardiologist in [place]   LOS: 3 days    Loreli SlotSteven C Hendrickson 05/24/2017

## 2017-05-24 NOTE — Discharge Summary (Signed)
Physician Discharge Summary  Patient ID: Sharon Cochran MRN: [id number] DOB/AGE: 60-19-1957 60 y.o.  Admit date: 05/19/2017 Discharge date: 05/26/2017  Admission Diagnoses: CAD  Discharge Diagnoses:  Principal Problem:   Chest pain with moderate risk of acute coronary syndrome Active Problems:   Troponin level elevated   GERD (gastroesophageal reflux disease)   Smoker   COPD (chronic obstructive pulmonary disease) (HCC)   Epigastric pain   Non-STEMI (non-ST elevated myocardial infarction) (HCC)   Unstable angina (HCC)   CAD in native artery   S/P CABG x 1  Patient Active Problem List   Diagnosis Date Noted  . 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 Non-STEMI (non-ST elevated myocardial infarction) (HCC) 05/20/2017  . Epigastric pain   . Unstable angina (HCC)   . CAD in native artery   . Chest pain with moderate risk of acute coronary syndrome 05/19/2017  . Troponin level elevated 05/19/2017  . GERD (gastroesophageal reflux disease) 05/19/2017  . Smoker 05/19/2017  . COPD (chronic obstructive pulmonary disease) (HCC) 05/19/2017   HPI: at time of consultation   61 yo woman presented with a cc/o CP  Sharon Cochran is a 61 yo woman with a 40 pack year history of tobacco abuse and COPD. She has been experiencing epigastric/ substernal chest discomfort for the past several weeks. On Friday 6/1 she had [place] nephew[place]. On Saturday she was experiencing a good deal of pain, which she described as a squeezing dull pressure sensation that made it difficult for her to breathe. She thought it might be related to her endoscopy. On Sunday at church she developed severe pain and had to go outside. The pain was unrelenting and she was [facility]. Her initial troponin was 0.09 but it quickly rose to 0.92. She underwent cardiac catheterization yesterday which revealed an 80-90% ostial  LAD stenosis. An FFR was performed and was 0.88. It was felt that she might be able to be managed medically. However she had 3 additional episodes of chest pressure post catheterization and had be placed back on intravenous heparin and nitroglycerin. She currently complains of a vague tightness but not the pressure that she was experiencing yesterday. She was seen in cardiothoracic surgical consultation by Dr. Dorris FetchHendrickson and recommendations were made for proceeding with coronary artery bypass grafting.   Discharged Condition: good  Hospital Course: The patient was admitted through the emergency department. She had positive troponins. She was felt to require admission for further evaluation and treatment. Workup included cardiac catheterization and she was felt to be a candidate for coronary artery bypass grafting. On 05/22/2017 she was taken to the operating room where she underwent the below described procedure.  Postoperative hospital course: The patient has progressed quite nicely. She was  Attempted to br weaned from the ventilator using the rapid wean protocol but had some CO2 retention. She is a smoker and smoking cessation has been discussed with her. She has remained hemodynamically stable. She has been started on a beta blocker, statin and aspirin. Blood pressures currently too low to start an ACE inhibitor. All routine lines, monitors and drainage devices have been discontinued in the standard fashion. She does not have a postoperative anemia. Renal function is within normal limits. Blood sugars have been under adequate control. Incisions are noted be healing well without evidence of infection. She is tolerating diet and gradually increasing activities. She is maintaining normal  sinus rhythm. She did have complaints of numbness left of sternum. This is likely related to takedown of the LIMA. Hopefully, it will improve with time. She has a history of COPD. She was requiring 2 liters of oxygen via  [place]. She was weaned to room air with oxygenation above 91%. Epicardial pacing wires were removed on 05/25/2017. Chest tube sutures will be removed the day of discharge. At time of discharge, she is felt to be quite stable.  Consults: cardiology  Significant Diagnostic Studies: CLINICAL DATA:  Status post coronary artery bypass grafting. Pleural effusions.  EXAM: CHEST  2 VIEW  COMPARISON:  May 24, 2017.  FINDINGS: Central catheter has been removed without evident pneumothorax. 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 No pneumothorax. There are persistent pleural effusions bilaterally with bilateral airspace consolidation. There is also atelectatic change in the left lower lobe. Heart is upper normal in size with pulmonary vascularity within normal limits. No evident adenopathy. No bone lesions. Patient is status post median sternotomy.  IMPRESSION: No pneumothorax. Persistent pleural effusions bilaterally with patchy bibasilar airspace consolidation. Linear atelectasis left lower lobe. Stable cardiac silhouette.  The consolidation in the lung bases may well represent a degree of pneumonia, although at least some of this consolidation is felt to be due to atelectatic change.   Electronically Signed   By: Bretta Bang III M.D.   On: 05/25/2017 07:29   angiography: cardiac cath  Treatments: surgery:   PHYSICIAN:  Viviann Spare C. Dorris Fetch, M.D. DATE OF BIRTH:  DATE OF PROCEDURE:  05/22/2017 DATE OF DISCHARGE:                              OPERATIVE REPORT   PREOPERATIVE DIAGNOSIS:  Single-vessel coronary disease with unstable angina.  POSTOPERATIVE DIAGNOSIS:  Single-vessel coronary disease with unstable angina.  PROCEDURE:  Median sternotomy off pump, coronary artery bypass grafting x1, left internal mammary artery to left anterior descending.  SURGEON:  Salvatore Decent. Dorris Fetch, M.D.  ASSISTANT:  Jari Favre, PA.  ANESTHESIA:   General. Discharge Exam: Blood pressure 110/63, pulse 76, temperature 98.1 F (36.7 C), temperature source Oral, resp. rate 18, height 5' (1.524 m), weight 74 kg (163 lb 2.3 oz), SpO2 91 %.  Car and diovascular: RRR Pulmonary: Slightly diminished at bases Abdomen: Soft, non tender, bowel sounds present. Extremities: Trace bilateral lower extremity edema. Wounds: Sternal wound is clean and dry.  No erythema or signs of infection.  Disposition: Stable and discharged to niece's home.  Discharge Instructions    Amb Referral to Cardiac Rehabilitation    Complete by:  As directed    Diagnosis:  CABG   CABG X ___:  1     Allergies as of 05/26/2017      Reactions   No Known Allergies       Medication List    TAKE these medications   albuterol (2.5 MG/3ML) 0.083% nebulizer solution Commonly known as:  PROVENTIL Take 2.5 mg by nebulization every 6 (six) hours as needed for wheezing or shortness of breath.   albuterol 108 (90 Base) MCG/ACT inhaler Commonly known as:  PROVENTIL HFA;VENTOLIN HFA Inhale 1-2 puffs into the lungs every 6 (six) hours as needed for wheezing or shortness of breath.   aspirin 325 MG EC tablet Take 1 tablet (325 mg total) by mouth daily. Start taking on:  05/27/2017   atorvastatin 80 MG tablet Commonly known as:  LIPITOR Take 1 tablet (80  mg total) by mouth daily at 6 PM.   diclofenac 50 MG tablet Commonly known as:  CATAFLAM Take 50 mg by mouth 2 (two) times daily.   Fluticasone-Salmeterol 250-50 MCG/DOSE Aepb Commonly known as:  ADVAIR Inhale 1 puff into the lungs 2 (two) times daily.   furosemide 40 MG tablet Commonly known as:  LASIX Take 1 tablet (40 mg total) by mouth daily. For 5 days then stop.   metoprolol tartrate 25 MG tablet Commonly known as:  LOPRESSOR Take 1 tablet (25 mg total) by mouth 2 (two) times daily.   metroNIDAZOLE 1 % gel Commonly known as:  METROGEL Apply 1 application topically at bedtime.   nicotine 14 mg/24hr  patch Commonly known as:  NICODERM CQ - dosed in mg/24 hours Place 1 patch (14 mg total) onto the skin daily. Start taking on:  05/27/2017   omeprazole 20 MG capsule Commonly known as:  PRILOSEC Take 20 mg by mouth daily.   oxyCODONE 5 MG immediate release tablet Commonly known as:  Oxy IR/ROXICODONE Take 5 mg by mouth every 4-6 hours PRN severe pain.   potassium chloride SA 20 MEQ tablet Commonly known as:  K-DUR,KLOR-CON Take 1 tablet (20 mEq total) by mouth daily. For 5 days then stop.   traZODone 50 MG tablet Commonly known as:  DESYREL Take 50 mg by mouth at bedtime.   venlafaxine XR 75 MG 24 hr capsule Commonly known as:  EFFEXOR-XR Take 75 mg by mouth daily with breakfast.      Follow-up Information    Loreli Slot, MD Follow up on 06/18/2017.   Specialty:  Cardiothoracic Surgery Why:  PA/LAT CXR to be taken (at [facility] which is in the same building as Dr. Sunday Corn office) on 06/18/2017 at 12:30 pm;Appointment time is at 1:00 pm Contact information: [address] [employer] [place] [address] [phone]        Janetta Hora, PA-C Follow up on 06/06/2017.   Specialties:  Cardiology, Radiology Why:  Appointment time is at 8:30 am. Patient also instructed to obtain a cardiologist upon her return home to [place]. Contact information: [address] [place] [address] [phone]        Medical Doctor Follow up.   Why:  Obtain a primary care physician for further surveillance of HGA1C 6 (pre diabetes)         The patient has been discharged on:   1.Beta Blocker:  Yes [ y  ]                              No   [   ]                              If No, reason:  2.Ace Inhibitor/ARB: Yes [   ]                                     No  [  n  ]                                     If No, reason:Labile BP-will try to start after discharge  3.Statin:   Yes [ y  ]  No  [   ]                   If No, reason:  4.Ecasa:  Yes  [ y  ]                  No   [   ]                  If No, reason: Signed: ZIMMERMAN,DONIELLE M  PA-C 05/26/2017, 10:04 AM

## 2017-05-24 NOTE — Progress Notes (Signed)
   Doing well!   [place] cardiologist in [place] that she could consider would be Nicholos JohnsStephen Powelson, MD.

## 2017-05-24 NOTE — Progress Notes (Signed)
CARDIAC REHAB PHASE I   PRE:  Rate/Rhythm: *83 SR  BP:  Sitting: 113/63        SaO2: 93 1.5L, 91 RA  MODE:  Ambulation: 140 ft   POST:  Rate/Rhythm: 85 SR  BP:  Sitting: 117/57         SaO2: 90-93 RA  Pt recently transferred from ICU, with multiple complaints about her new room, frustrated. Pt required assistance from lying to sitting position in bed, tense. Pt ambulated 140 ft on RA, rolling walker, assist x1, very slow, steady gait, tolerated fair. Pt c/o mild dizziness upon return to room, sternal pain, denies any other specific complaints, standing rest x1. Pt assisted to bathroom, then to recliner after walk, call bell within reach. Encouraged IS, additional ambulation x1 today. Pt requested family be present for discharge education. Will follow-up tomorrow.   [phone] Joylene GrapesEmily C Hattie Aguinaldo, RN, BSN 05/24/2017 1:44 PM

## 2017-05-24 NOTE — Progress Notes (Signed)
Pt arrived to 2w from 2h. Pt oriented to room and staff. Vitals obtained. Telemetry box applied and CCMD notified. Pt denies needs at this time. Pt resting in bed with call light within reach. Will continue current plan of care.  Berdine DanceLauren Moffitt BSN, RN

## 2017-05-24 NOTE — Plan of Care (Signed)
Problem: Activity: Goal: Risk for activity intolerance will decrease Outcome: Progressing Daily increase in activity

## 2017-05-25 ENCOUNTER — Inpatient Hospital Stay (HOSPITAL_COMMUNITY): Payer: BLUE CROSS/BLUE SHIELD

## 2017-05-25 LAB — BASIC METABOLIC PANEL
Anion gap: 8 (ref 5–15)
BUN: 10 mg/dL (ref 6–20)
CO2: 31 mmol/L (ref 22–32)
Calcium: 8.5 mg/dL — ABNORMAL LOW (ref 8.9–10.3)
Chloride: 98 mmol/L — ABNORMAL LOW (ref 101–111)
Creatinine, Ser: 0.59 mg/dL (ref 0.44–1.00)
GFR calc Af Amer: >60 mL/min (ref >60)
GLUCOSE: 123 mg/dL — AB (ref 65–99)
Potassium: 3.8 mmol/L (ref 3.5–5.1)
Sodium: 137 mmol/L (ref 135–145)

## 2017-05-25 LAB — CBC
HEMATOCRIT: 37.9 % (ref 36.0–46.0)
HEMOGLOBIN: 11.9 g/dL — AB (ref 12.0–15.0)
MCH: 30.7 pg (ref 26.0–34.0)
MCHC: 31.4 g/dL (ref 30.0–36.0)
MCV: 97.9 fL (ref 78.0–100.0)
PLATELETS: 191 10*3/uL (ref 150–400)
RBC: 3.87 MIL/uL (ref 3.87–5.11)
RDW: 13.2 % (ref 11.5–15.5)
WBC: 8.1 10*3/uL (ref 4.0–10.5)

## 2017-05-25 MED ORDER — POTASSIUM CHLORIDE CRYS ER 20 MEQ PO TBCR
20.0000 meq | EXTENDED_RELEASE_TABLET | Freq: Once | ORAL | Status: AC
  Administered 2017-05-25: 20 meq via ORAL
  Filled 2017-05-25: qty 1

## 2017-05-25 MED ORDER — LACTULOSE 10 GM/15ML PO SOLN
20.0000 g | Freq: Once | ORAL | Status: DC
  Filled 2017-05-25: qty 30

## 2017-05-25 NOTE — Progress Notes (Signed)
CARDIAC REHAB PHASE I   PRE:  Rate/Rhythm: 73 SR  BP:  Supine:   Sitting: 112/54  Standing:    SaO2: 93% 2 L O2  MODE:  Ambulation: 500 ft   POST:  Rate/Rhythem: 84  BP:  Supine:   Sitting: 110/60  Standing:    SaO2: 94% 2 L O2 [id number] Patient tolerated ambulation well with assist x1 and pushing rolling walker. Gait slow, steady. Assisted to bathroom after walk. [facility] discharge education completed with patient and patient's niece including restrictions, sternal precautions, IS use, risk factor modification and activity progression. MI book, heart healthy diet, quit smoking and exercise guidelines handouts given. Pt verbalizes understanding of instructions given. Discussed phase 2 cardiac rehab, and pt is interested. [facility] [place].  Cristy Hiltslinty Elo Marmolejos, MS, ACSM CEP

## 2017-05-25 NOTE — Progress Notes (Signed)
Patient weaned to room air. Tolerating it well. Will continue to monitor.

## 2017-05-25 NOTE — Progress Notes (Addendum)
      [address]       [place] [id number]             [phone]        3 Days Post-Op Procedure(s) (LRB): OFF PUMP CORONARY ARTERY BYPASS GRAFTING (CABG) x1 using left internal mammary artery (N/A) TRANSESOPHAGEAL ECHOCARDIOGRAM (TEE) (N/A)  Subjective: Patient felt fluttering in her chest last night, has sternal incisional pain, and some numbness left side of sternum. She is passing flatus but no bowel movement yet.  Objective: Vital signs in last 24 hours: Temp:  [98.2 F (36.8 C)-98.9 F (37.2 C)] 98.2 F (36.8 C) (06/09 0526) Pulse Rate:  [73-91] 73 (06/09 0526) Cardiac Rhythm: Normal sinus rhythm (06/08 1900) Resp:  [11-20] 16 (06/09 0526) BP: (95-129)/(56-66) 95/58 (06/09 0526) SpO2:  [87 %-98 %] 98 % (06/09 0526) Weight:  [74.2 kg (163 lb 8 oz)] 74.2 kg (163 lb 8 oz) (06/09 0241)  Pre op weight 74.4 kg Current Weight  05/25/17 74.2 kg (163 lb 8 oz)      Intake/Output from previous day: 06/08 0701 - 06/09 0700 In: 360 [P.O.:360] Out: 550 [Urine:550]   Physical Exam:  Cardiovascular: RRR Pulmonary: Diminished at bases Abdomen: Soft, non tender, bowel sounds present. Extremities: Trace bilateral lower extremity edema. Wounds: Sternal dressing removed. Wound is clean and dry.  No erythema or signs of infection.  Lab Results: CBC: Recent Labs  05/24/17 0411 05/25/17 0232  WBC 11.5* 8.1  HGB 12.0 11.9*  HCT 37.5 37.9  PLT 173 191   BMET:  Recent Labs  05/24/17 0411 05/25/17 0232  NA 134* 137  K 4.1 3.8  CL 99* 98*  CO2 27 31  GLUCOSE 118* 123*  BUN 7 10  CREATININE 0.59 0.59  CALCIUM 8.4* 8.5*    PT/INR:  Lab Results  Component Value Date   INR 1.12 05/22/2017   INR 0.95 05/19/2017   ABG:  INR: Will add last result for INR, ABG once components are confirmed Will add last 4 CBG results once components are confirmed  Assessment/Plan:  1. CV - SR in the 70's. No A fib.SBP more in the 90's of late. On Lopressor  25 mg bid-monitor as may need to decrease Lopressor. BP too labile for ACE/ARB. 2.  Pulmonary - On 2 liters of room air. CXR this am appears to show no pneumothorax, bilateral pleural effusions and atelectasis. Encourage incentive spirometer. 3. Supplement potassium 4. Mild volume overload-on Lasix 40 mg daily 5. Remove EPW 6. Regarding numbness left of sternum, likely related to takedown of LIMA. Hopefully, will improve with time. 7. LOC constipation 8. Possibly discharge in am  ZIMMERMAN,DONIELLE MPA-C 05/25/2017,7:20 AM   Chart reviewed, patient examined, agree with above.

## 2017-05-26 MED ORDER — OXYCODONE HCL 5 MG PO TABS: ORAL_TABLET | ORAL | 0 refills | Status: DC

## 2017-05-26 MED ORDER — POTASSIUM CHLORIDE CRYS ER 20 MEQ PO TBCR: 20.0000 meq | EXTENDED_RELEASE_TABLET | Freq: Every day | ORAL | 0 refills | Status: DC

## 2017-05-26 MED ORDER — ATORVASTATIN CALCIUM 80 MG PO TABS: 80.0000 mg | ORAL_TABLET | Freq: Every day | ORAL | 1 refills | Status: AC

## 2017-05-26 MED ORDER — FUROSEMIDE 40 MG PO TABS: 40.0000 mg | ORAL_TABLET | Freq: Every day | ORAL | 0 refills | Status: DC

## 2017-05-26 MED ORDER — ASPIRIN 325 MG PO TBEC: 325.0000 mg | DELAYED_RELEASE_TABLET | Freq: Every day | ORAL | 0 refills | Status: AC

## 2017-05-26 MED ORDER — METOPROLOL TARTRATE 25 MG PO TABS: 25.0000 mg | ORAL_TABLET | Freq: Two times a day (BID) | ORAL | 1 refills | Status: DC

## 2017-05-26 MED ORDER — NICOTINE 14 MG/24HR TD PT24: 14.0000 mg | MEDICATED_PATCH | Freq: Every day | TRANSDERMAL | 1 refills | Status: DC

## 2017-05-26 NOTE — Progress Notes (Signed)
Discharge instructions reviewed with pt and husband by previous RN Colin MuldersBrianna. This RN answered questions niece and pt had when she arrived to transport pt and husband, (pt staying with niece locally for a period of time until surgeons release her to return home to [place]).  Copy of instructions and scripts had already been given to pt/husband by previous RN Colin MuldersBrianna.  Pt d/c'd via wheelchair with belongings, with husband and niece. Escorted by unit NT.

## 2017-05-26 NOTE — Progress Notes (Addendum)
      [address]       [place] [id number]             [phone]        4 Days Post-Op Procedure(s) (LRB): OFF PUMP CORONARY ARTERY BYPASS GRAFTING (CABG) x1 using left internal mammary artery (N/A) TRANSESOPHAGEAL ECHOCARDIOGRAM (TEE) (N/A)  Subjective: Patient had two small bowel movements. She is feeling better.   Objective: Vital signs in last 24 hours: Temp:  [98.1 F (36.7 C)-98.9 F (37.2 C)] 98.1 F (36.7 C) (06/10 0607) Pulse Rate:  [75-78] 76 (06/10 0607) Cardiac Rhythm: Normal sinus rhythm (06/10 0700) Resp:  [18] 18 (06/09 2011) BP: (112-130)/(52-70) 114/64 (06/10 0607) SpO2:  [89 %-98 %] 94 % (06/10 0607) Weight:  [74 kg (163 lb 2.3 oz)] 74 kg (163 lb 2.3 oz) (06/10 0500)  Pre op weight 74.4 kg Current Weight  05/26/17 74 kg (163 lb 2.3 oz)      Intake/Output from previous day: 06/09 0701 - 06/10 0700 In: 120 [P.O.:120] Out: -    Physical Exam:  Cardiovascular: RRR Pulmonary: Slightly diminished at bases Abdomen: Soft, non tender, bowel sounds present. Extremities: Trace bilateral lower extremity edema. Wounds: Sternal wound is clean and dry.  No erythema or signs of infection.  Lab Results: CBC:  Recent Labs  05/24/17 0411 05/25/17 0232  WBC 11.5* 8.1  HGB 12.0 11.9*  HCT 37.5 37.9  PLT 173 191   BMET:   Recent Labs  05/24/17 0411 05/25/17 0232  NA 134* 137  K 4.1 3.8  CL 99* 98*  CO2 27 31  GLUCOSE 118* 123*  BUN 7 10  CREATININE 0.59 0.59  CALCIUM 8.4* 8.5*    PT/INR:  Lab Results  Component Value Date   INR 1.12 05/22/2017   INR 0.95 05/19/2017   ABG:  INR: Will add last result for INR, ABG once components are confirmed Will add last 4 CBG results once components are confirmed  Assessment/Plan:  1. CV - SR in the 60's this am. On Lopressor 25 mg bid. BP too labile for ACE/ARB. 2.  Pulmonary - Was on 2 liters of room air. She was put to room air this am.Encourage incentive spirometer. 3.  Mild volume overload-on Lasix 40 mg daily 4. Likely discharge later today.   ZIMMERMAN,DONIELLE MPA-C 05/26/2017,7:36 AM    Chart reviewed, patient examined, agree with above. She is feeling better, ambulating well. I think she can go home today. She is going to stay locally with her niece for a few weeks.

## 2017-05-26 NOTE — Care Management Note (Signed)
Case Management Note  Patient Details  Name: Rachael FeeLaura L Bau MRN: [id number] Date of Birth: [birth year]  Subjective/Objective:                 Patient with order to DC to home. No CM needs, consults, or orders identified at this time.    Action/Plan:  DC to home self care.  Expected Discharge Date:  05/26/17               Expected Discharge Plan:  Home/Self Care  In-House Referral:     Discharge planning Services  CM Consult  Post Acute Care Choice:    Choice offered to:     DME Arranged:    DME Agency:     HH Arranged:    HH Agency:     Status of Service:  Completed, signed off  If discussed at [employer] Length of Stay Meetings, dates discussed:    Additional Comments:  Lawerance SabalDebbie Choice Kleinsasser, RN 05/26/2017, 1:25 PM

## 2017-05-27 ENCOUNTER — Other Ambulatory Visit: Payer: Self-pay | Admitting: *Deleted

## 2017-05-27 DIAGNOSIS — R110 Nausea: Secondary | ICD-10-CM

## 2017-05-27 DIAGNOSIS — Z9889 Other specified postprocedural states: Secondary | ICD-10-CM

## 2017-05-27 DIAGNOSIS — R197 Diarrhea, unspecified: Secondary | ICD-10-CM

## 2017-05-27 DIAGNOSIS — Z736 Limitation of activities due to disability: Secondary | ICD-10-CM

## 2017-05-27 MED ORDER — METRONIDAZOLE 500 MG PO TABS: 500.0000 mg | ORAL_TABLET | Freq: Three times a day (TID) | ORAL | 0 refills | Status: AC

## 2017-05-27 MED ORDER — PROMETHAZINE HCL 12.5 MG PO TABS: 12.5000 mg | ORAL_TABLET | Freq: Four times a day (QID) | ORAL | 0 refills | Status: DC | PRN

## 2017-05-30 ENCOUNTER — Telehealth: Payer: Self-pay | Admitting: Interventional Cardiology

## 2017-05-30 NOTE — Telephone Encounter (Signed)
Pt called regarding her both arms being cold from the elbow to fingertips. Pt was seen in the [facility] with chest pain she was referred to Dr Charlett LangoSteven Hendrickson Thoracic surgeon. Pt did undergo CABG'S x 1 on 6/6. Pt has a F/U  appointment with Merrilee JanskyKathy thompson PA on 6/21 and with Dr. Dorris FetchHendrickson on 06/18/17. Pt is going back [place] [facility] [place] after released by Dr. Dorris FetchHendrickson . Pt  is aware to find a cardiologist there.  Pt was made aware that she needs to call her cardiac surgeon on [phone] for the symptoms she has now. Pt verbalized understanding.

## 2017-05-30 NOTE — Telephone Encounter (Signed)
New message   Pt niece Ericka,verbalized pt is having numbness in both hands   mostly in the left hand and up into her arm to elbow   Her finger tips are staying cold

## 2017-06-02 ENCOUNTER — Telehealth: Payer: Self-pay | Admitting: Physician Assistant

## 2017-06-02 NOTE — Telephone Encounter (Signed)
Mrs. Dwain SarnaSchaumleffel contacted the after hours line concerned about weight gain and chest discomfort.  She states that she read she should call if she gained weight.  She also states she had chest pain last night prior to going to bed that was quite uncomfortable.  She denied shortness of breath, nausea/ vomiting, and palpitations.  She is S/P CABG x 1 performed by Dr. Dorris FetchHendrickson on 05/22/2017, later discharged home on 05/26/2017.  She has gain 1.5 lbs since yesterday.  Her Lasix regimen was completed on 05/31/2017.  She is utilizing her Oxy appropriately, but only has 2 pills left and would like a refill.  Plan:  In regards to weight gain 1.5 lbs is not concerning.  I encouraged patient to recheck her weight on Tuesday and if she is > 5 lbs than her discharge weight with associated swelling we would need to resume her Lasix at that time.  However, I told the patient a minor fluctuation in weight of 1-2 lbs that resolves in not concerning.  I will give her a refill on her Oxycodone prescription for 5 mg every 6 hours as needed for pain disp 28 tablet.  This will be left on the 2W unit and her husband will pick up the prescription.   She was instructed to contact us with further questions/concerns.  We will otherwise see her in follow up on 06/18/2017 at scheduled.

## 2017-06-04 NOTE — Progress Notes (Addendum)
Cardiology Office Note    Date:  06/06/2017   ID:  Sharon Cochran, DOB 01-Sep-1956, MRN [id number]  PCP:  System, Pcp Not In  Cardiologist:  Dr. Katrinka Blazing   CC: post hospital follow up  History of Present Illness:  Sharon Cochran is a 61 y.o. female with a history of COPD, gastric ulcers, long term tobacco abuse and recently diagnosed CAD s/p CABG x1V who presents to clinic for post hospital follow up.   Admitted 6/3-6/10/18 for NSTEMI. Cath showed a 80-90% ostial LAD stenosis (per Dr. Sunday Corn note, however cath says 60% ostial stenosis). FFR was performed and was 0.88. It was felt that she might be able to be managed medically. However she had 3 additional episodes of chest pressure post catheterization and had be placed back on intravenous heparin and nitroglycerin. 2D ECHO showed EF 60-65%, G1DD, mild MR. She ultimately underwent CABG x1 V (LIMA --> LAD) on 05/22/17 by Dr. Dorris Fetch. She is from [place] but was in town visiting for her nephews graduation. Post op course was uncomplicated.   Today she presents to clinic for follow up. She gets fatigued very easily. She does not have any chest pain but gets some sharp/stabbing pain under her rib cage with a deep breath. She has been walking some. She went to [facility] for some exercise and did okay. She had some nightmares that she thinks are medication related. No LE edema. She is sleeping on an incline as this is most comfortable for her incision. No PND. She occasionally gets dizzy but no syncope. She had a tiny bit of blood in her urine after discharge that has now resolved. She has urinary frequency but no dysuria. She has totally quit smoking. She see's Dr. Dorris Fetch 7/3 and will return to [facility]. She has already enrolled cardiac rehab there and will set up general cardiology.     Past Medical History:  Diagnosis Date  . COPD (chronic obstructive pulmonary disease) (HCC)   . GERD (gastroesophageal reflux disease)     Past  Surgical History:  Procedure Laterality Date  . CORONARY ARTERY BYPASS GRAFT N/A 05/22/2017   Procedure: OFF PUMP CORONARY ARTERY BYPASS GRAFTING (CABG) x1 using left internal mammary artery;  Surgeon: Loreli Slot, MD;  Location: [facility] OR;  Service: Open Heart Surgery;  Laterality: N/A;  . INTRAVASCULAR PRESSURE WIRE/FFR STUDY N/A 05/20/2017   Procedure: Intravascular Pressure Wire/FFR Study;  Surgeon: Yvonne Kendall, MD;  Location: [facility] INVASIVE CV LAB;  Service: Cardiovascular;  Laterality: N/A;  . LEFT HEART CATH AND CORONARY ANGIOGRAPHY N/A 05/20/2017   Procedure: Left Heart Cath and Coronary Angiography;  Surgeon: Yvonne Kendall, MD;  Location: [facility] INVASIVE CV LAB;  Service: Cardiovascular;  Laterality: N/A;  . TEE WITHOUT CARDIOVERSION N/A 05/22/2017   Procedure: TRANSESOPHAGEAL ECHOCARDIOGRAM (TEE);  Surgeon: Loreli Slot, MD;  Location[facility] OR;  Service: Open Heart Surgery;  Laterality: N/A;  . TUBAL LIGATION      Current Medications: Outpatient Medications Prior to Visit  Medication Sig Dispense Refill  . albuterol (PROVENTIL HFA;VENTOLIN HFA) 108 (90 Base) MCG/ACT inhaler Inhale 1-2 puffs into the lungs every 6 (six) hours as needed for wheezing or shortness of breath.    Marland Kitchen albuterol (PROVENTIL) (2.5 MG/3ML) 0.083% nebulizer solution Take 2.5 mg by nebulization every 6 (six) hours as needed for wheezing or shortness of breath.    Marland Kitchen aspirin EC 325 MG EC tablet Take 1 tablet (325 mg total) by mouth daily. 30 tablet 0  .  atorvastatin (LIPITOR) 80 MG tablet Take 1 tablet (80 mg total) by mouth daily at 6 PM. 30 tablet 1  . diclofenac (CATAFLAM) 50 MG tablet Take 50 mg by mouth 2 (two) times daily.  0  . Fluticasone-Salmeterol (ADVAIR) 250-50 MCG/DOSE AEPB Inhale 1 puff into the lungs 2 (two) times daily.    . metroNIDAZOLE (METROGEL) 1 % gel Apply 1 application topically at bedtime.    . nicotine (NICODERM CQ - DOSED IN MG/24 HOURS) 14 mg/24hr patch Place 1 patch (14 mg total)  onto the skin daily. 28 patch 1  . omeprazole (PRILOSEC) 20 MG capsule Take 20 mg by mouth daily.    Marland Kitchen oxyCODONE (OXY IR/ROXICODONE) 5 MG immediate release tablet Take 5 mg by mouth every 4-6 hours PRN severe pain. 30 tablet 0  . promethazine (PHENERGAN) 12.5 MG tablet Take 1 tablet (12.5 mg total) by mouth every 6 (six) hours as needed for nausea or vomiting. 24 tablet 0  . traZODone (DESYREL) 50 MG tablet Take 50 mg by mouth at bedtime.    Marland Kitchen venlafaxine XR (EFFEXOR-XR) 75 MG 24 hr capsule Take 75 mg by mouth daily with breakfast.    . furosemide (LASIX) 40 MG tablet Take 1 tablet (40 mg total) by mouth daily. For 5 days then stop. 5 tablet 0  . metoprolol tartrate (LOPRESSOR) 25 MG tablet Take 1 tablet (25 mg total) by mouth 2 (two) times daily. 60 tablet 1  . potassium chloride SA (K-DUR,KLOR-CON) 20 MEQ tablet Take 1 tablet (20 mEq total) by mouth daily. For 5 days then stop. 5 tablet 0   No facility-administered medications prior to visit.      Allergies:   No known allergies   Social History   Social History  . Marital status: Married    Spouse name: N/A  . Number of children: N/A  . Years of education: N/A   Social History Main Topics  . Smoking status: Former Smoker    Packs/day: 1.00    Types: Cigarettes  . Smokeless tobacco: Never Used  . Alcohol use 2.4 oz/week    4 Shots of liquor per week  . Drug use: No  . Sexual activity: Not Asked   Other Topics Concern  . None   Social History Narrative  . None     Family History:  The patient's family history includes Hypertension in her mother.       ROS:   Please see the history of present illness.    ROS All other systems reviewed and are negative.   PHYSICAL EXAM:   VS:  BP 106/62 (BP Location: Right Arm)   Pulse 66   Ht 5' (1.524 m)   Wt 156 lb 12.8 oz (71.1 kg)   BMI 30.62 kg/m    GEN: Well nourished, well developed, in no acute distress, obese, appear weak  HEENT: normal  Neck: no JVD, carotid bruits,  or masses Cardiac: RRR; no murmurs, rubs, or gallops,no edema  Respiratory:  clear to auscultation bilaterally, normal work of breathing GI: soft, nontender, nondistended, + BS MS: no deformity or atrophy  Skin: warm and dry, no rash Neuro:  Alert and Oriented x 3, Strength and sensation are intact Psych: euthymic mood, full affect    Wt Readings from Last 3 Encounters:  06/06/17 156 lb 12.8 oz (71.1 kg)  05/26/17 163 lb 2.3 oz (74 kg)      Studies/Labs Reviewed:   EKG:  EKG is ordered today.  The ekg  ordered today demonstrates NSR, low voltage QRS, HR 66  Recent Labs: 05/19/2017: ALT 27 05/23/2017: Magnesium 1.8 05/25/2017: BUN 10; Creatinine, Ser 0.59; Hemoglobin 11.9; Platelets 191; Potassium 3.8; Sodium 137   Lipid Panel    Component Value Date/Time   CHOL 141 05/20/2017 1918   TRIG 111 05/20/2017 1918   HDL 32 (L) 05/20/2017 1918   CHOLHDL 4.4 05/20/2017 1918   VLDL 22 05/20/2017 1918   LDLCALC 87 05/20/2017 1918    Additional studies/ records that were reviewed today include:  LHC: 05/20/17 Conclusions: 1. 60% ostial LAD stenosis that is not hemodynamically significant by FFR (0.88). Mild disease also noted in the distal LMCA, LCx, and RCA. 2. Focal mid inferior akinesis with otherwise preserved LV contraction. No culprit lesion to explain wall motion abnormality. Takotsubo variant would be a consideration. 3. Mildly elevated left ventricular filling pressure. Recommendations: 1. Aggressive medical therapy, including addition of high-intensity statin therapy and beta-blockade. 2. Proceed with echo to confirm inferior wall-motion abnormality.   TTE: 05/20/17 Study Conclusions - Left ventricle: The cavity size was normal. Systolic function was normal. The estimated ejection fraction was in the range of 60% to 65%. Wall motion was normal; there were no regional wall motion abnormalities. Doppler parameters are consistent with abnormal left ventricular relaxation  (grade 1 diastolic dysfunction). There was no evidence of elevated ventricular filling pressure by Doppler parameters. - Aortic valve: There was trivial regurgitation. - Mitral valve: Structurally normal valve. There was mild regurgitation. - Right ventricle: Systolic function was normal. - Right atrium: The atrium was normal in size. - Tricuspid valve: There was no regurgitation. - Pulmonary arteries: Systolic pressure was within the normal range. - Inferior vena cava: The vessel was normal in size. - Pericardium, extracardiac: There was no pericardial effusion.     ASSESSMENT & PLAN:   CAD s/p CABG x1V: having a slow recovery but overall doing okay. No chest pain. Continue ASA 325mg  daily, atorva 80mg  daily and Lopressor 25mg  BID ( I will decrease this to 12.5mg  BID given soft BPs)  COPD: stable  Tobacco abuse: she has totally quit  Pleurisy: she has sharp pain under her rib cage that is worse with a deep breath in. She is not tachypnic, tachycardic or hypoxic (02 sats remained >94% with ambulation). Doubt PE. She did have bilateral pleural effusions on CXR 05/25/17. Discussed case with DOD, Dr. Sanjuana Kava, who felt we should check a CXR to assess pleural effusions    Dispo: she has already signed up for cardiac rehab in [place]. She will let us know when she establishes care with a cardiology group there and we can fax records.  Medication Adjustments/Labs and Tests Ordered: Current medicines are reviewed at length with the patient today.  Concerns regarding medicines are outlined above.  Medication changes, Labs and Tests ordered today are listed in the Patient Instructions below. Patient Instructions  Medication Instructions:  Your physician has recommended you make the following change in your medication:  1.  REDUCE the Lopressor to 1/2 tablet daily = 12.5mg   Labwork: None ordered  Testing/Procedures: A chest x-ray takes a picture of the organs and structures inside  the chest, including the heart, lungs, and blood vessels. This test can show several things, including, whether the heart is enlarges; whether fluid is building up in the lungs; and whether pacemaker / defibrillator leads are still in place.  Follow-Up: Your physician recommends that you schedule a follow-up appointment in: AS NEEDED   Any Other Special Instructions  Will Be Listed Below (If Applicable).  Chest X-Ray A chest X-ray is a painless test that uses radiation to create images of the structures inside of your chest. Chest X-rays are used to look for many health conditions, including heart failure, pneumonia, tuberculosis, rib fractures, breathing disorders, and cancer. They may be used to diagnose chest pain, constant coughing, or trouble breathing. Tell a health care provider about:  Any allergies you have.  All medicines you are taking, including vitamins, herbs, eye drops, creams, and over-the-counter medicines.  Any surgeries you have had.  Any medical conditions you have.  Whether you are pregnant or may be pregnant. What are the risks? Getting a chest X-ray is a safe procedure. However, you will be exposed to a small amount of radiation. Being exposed to too much radiation over a lifetime can increase the risk of cancer. This risk is small, but it may occur if you have many X-rays throughout your life. What happens before the procedure?  You may be asked to remove glasses, jewelry, and any other metal objects.  You will be asked to undress from the waist up. You may be given a hospital gown to wear.  You may be asked to wear a protective lead apron to protect parts of your body from radiation. What happens during the procedure?  You will be asked to stand still as each picture is taken to get the best possible images.  You will be asked to take a deep breath and hold your breath for a few seconds.  The X-ray machine will create a picture of your chest using a tiny  burst of radiation. This is painless.  More pictures may be taken from other angles. Typically, one picture will be taken while you face the X-ray camera, and another picture will be taken from the side while you stand. If you cannot stand, you may be asked to lie down. The procedure may vary among health care providers and hospitals. What happens after the procedure?  The X-ray(s) will be reviewed by your health care provider or an X-ray (radiology) specialist.  It is up to you to get your test results. Ask your health care provider, or the department that is doing the test, when your results will be ready.  Your health care provider will tell you if you need more tests or a follow-up exam. Keep all follow-up visits as told by your health care provider. This is important. Summary  A chest X-ray is a safe, painless test that is used to examine the inside of the chest, heart, and lungs.  You will need to undress from the waist up and remove jewelry and metal objects before the procedure.  You will be exposed to a small amount of radiation during the procedure.  The X-ray machine will take one or more pictures of your chest while you remain as still as possible.  Later, a health care provider or specialist will review the test results with you. This information is not intended to replace advice given to you by your health care provider. Make sure you discuss any questions you have with your health care provider. Document Released: 01/29/2017 Document Revised: 01/29/2017 Document Reviewed: 01/29/2017 Elsevier Interactive Patient Education  [employer]2018 [employer].    If you need a refill on your cardiac medications before your next appointment, please call your pharmacy.      Signed, Cline CrockKathryn Alexi Dorminey, PA-C  06/06/2017 9:53 AM    [facility] [address] [place], [place]  [address] Phone: [phone]; Fax: [phone]

## 2017-06-06 ENCOUNTER — Ambulatory Visit
Admission: RE | Admit: 2017-06-06 | Discharge: 2017-06-06 | Disposition: A | Discharge status: Home / Self Care | Payer: BLUE CROSS/BLUE SHIELD | Source: Ambulatory Visit | Attending: Physician Assistant | Admitting: Physician Assistant

## 2017-06-06 ENCOUNTER — Telehealth: Payer: Self-pay | Admitting: *Deleted

## 2017-06-06 ENCOUNTER — Encounter: Payer: Self-pay | Admitting: Physician Assistant

## 2017-06-06 ENCOUNTER — Ambulatory Visit (INDEPENDENT_AMBULATORY_CARE_PROVIDER_SITE_OTHER): Payer: BLUE CROSS/BLUE SHIELD | Admitting: Physician Assistant

## 2017-06-06 VITALS — BP 106/62 | HR 66 | Ht 60.0 in | Wt 156.8 lb

## 2017-06-06 DIAGNOSIS — I2510 Atherosclerotic heart disease of native coronary artery without angina pectoris: Secondary | ICD-10-CM | POA: Diagnosis not present

## 2017-06-06 DIAGNOSIS — R091 Pleurisy: Secondary | ICD-10-CM

## 2017-06-06 DIAGNOSIS — J449 Chronic obstructive pulmonary disease, unspecified: Secondary | ICD-10-CM

## 2017-06-06 DIAGNOSIS — Z951 Presence of aortocoronary bypass graft: Secondary | ICD-10-CM

## 2017-06-06 DIAGNOSIS — I214 Non-ST elevation (NSTEMI) myocardial infarction: Secondary | ICD-10-CM

## 2017-06-06 DIAGNOSIS — Z720 Tobacco use: Secondary | ICD-10-CM

## 2017-06-06 MED ORDER — FUROSEMIDE 20 MG PO TABS: 20.0000 mg | ORAL_TABLET | Freq: Every day | ORAL | 0 refills | Status: DC

## 2017-06-06 MED ORDER — METOPROLOL TARTRATE 25 MG PO TABS: 12.5000 mg | ORAL_TABLET | Freq: Two times a day (BID) | ORAL | 1 refills | Status: DC

## 2017-06-06 NOTE — Patient Instructions (Addendum)
Medication Instructions:  Your physician has recommended you make the following change in your medication:  1.  REDUCE the Lopressor to 1/2 tablet daily = 12.5mg   Labwork: None ordered  Testing/Procedures: A chest x-ray takes a picture of the organs and structures inside the chest, including the heart, lungs, and blood vessels. This test can show several things, including, whether the heart is enlarges; whether fluid is building up in the lungs; and whether pacemaker / defibrillator leads are still in place.  Follow-Up: Your physician recommends that you schedule a follow-up appointment in: AS NEEDED   Any Other Special Instructions Will Be Listed Below (If Applicable).  Chest X-Ray A chest X-ray is a painless test that uses radiation to create images of the structures inside of your chest. Chest X-rays are used to look for many health conditions, including heart failure, pneumonia, tuberculosis, rib fractures, breathing disorders, and cancer. They may be used to diagnose chest pain, constant coughing, or trouble breathing. Tell a health care provider about:  Any allergies you have.  All medicines you are taking, including vitamins, herbs, eye drops, creams, and over-the-counter medicines.  Any surgeries you have had.  Any medical conditions you have.  Whether you are pregnant or may be pregnant. What are the risks? Getting a chest X-ray is a safe procedure. However, you will be exposed to a small amount of radiation. Being exposed to too much radiation over a lifetime can increase the risk of cancer. This risk is small, but it may occur if you have many X-rays throughout your life. What happens before the procedure?  You may be asked to remove glasses, jewelry, and any other metal objects.  You will be asked to undress from the waist up. You may be given a hospital gown to wear.  You may be asked to wear a protective lead apron to protect parts of your body from  radiation. What happens during the procedure?  You will be asked to stand still as each picture is taken to get the best possible images.  You will be asked to take a deep breath and hold your breath for a few seconds.  The X-ray machine will create a picture of your chest using a tiny burst of radiation. This is painless.  More pictures may be taken from other angles. Typically, one picture will be taken while you face the X-ray camera, and another picture will be taken from the side while you stand. If you cannot stand, you may be asked to lie down. The procedure may vary among health care providers and hospitals. What happens after the procedure?  The X-ray(s) will be reviewed by your health care provider or an X-ray (radiology) specialist.  It is up to you to get your test results. Ask your health care provider, or the department that is doing the test, when your results will be ready.  Your health care provider will tell you if you need more tests or a follow-up exam. Keep all follow-up visits as told by your health care provider. This is important. Summary  A chest X-ray is a safe, painless test that is used to examine the inside of the chest, heart, and lungs.  You will need to undress from the waist up and remove jewelry and metal objects before the procedure.  You will be exposed to a small amount of radiation during the procedure.  The X-ray machine will take one or more pictures of your chest while you remain as still as  possible.  Later, a health care provider or specialist will review the test results with you. This information is not intended to replace advice given to you by your health care provider. Make sure you discuss any questions you have with your health care provider. Document Released: 01/29/2017 Document Revised: 01/29/2017 Document Reviewed: 01/29/2017 Elsevier Interactive Patient Education  [employer]2018 [employer].    If you need a refill on your cardiac  medications before your next appointment, please call your pharmacy.

## 2017-06-06 NOTE — Telephone Encounter (Signed)
-----   Message from Janetta HoraKathryn R Thompson, PA-C sent at 06/06/2017 11:53 AM EDT ----- She still has pleural effusions, but they are improving. This is probably cause of her pain when breathing in. I think she will just continue to autodiruese, but if she would like, I can send in a short course of lasix.

## 2017-06-11 ENCOUNTER — Ambulatory Visit: Payer: BLUE CROSS/BLUE SHIELD | Admitting: Surgery

## 2017-06-17 ENCOUNTER — Encounter (HOSPITAL_COMMUNITY): Payer: Self-pay

## 2017-06-17 ENCOUNTER — Emergency Department (HOSPITAL_COMMUNITY): Payer: BLUE CROSS/BLUE SHIELD

## 2017-06-17 ENCOUNTER — Telehealth: Payer: Self-pay | Admitting: *Deleted

## 2017-06-17 ENCOUNTER — Observation Stay (HOSPITAL_COMMUNITY)
Admission: EM | Admit: 2017-06-17 | Discharge: 2017-06-19 | DRG: 313 | Disposition: A | Discharge status: Home / Self Care | Payer: BLUE CROSS/BLUE SHIELD | Attending: Internal Medicine | Admitting: Internal Medicine

## 2017-06-17 DIAGNOSIS — F17200 Nicotine dependence, unspecified, uncomplicated: Secondary | ICD-10-CM | POA: Diagnosis not present

## 2017-06-17 DIAGNOSIS — R0789 Other chest pain: Secondary | ICD-10-CM | POA: Diagnosis not present

## 2017-06-17 DIAGNOSIS — Z6831 Body mass index (BMI) 31.0-31.9, adult: Secondary | ICD-10-CM

## 2017-06-17 DIAGNOSIS — Z79891 Long term (current) use of opiate analgesic: Secondary | ICD-10-CM

## 2017-06-17 DIAGNOSIS — Z951 Presence of aortocoronary bypass graft: Secondary | ICD-10-CM | POA: Diagnosis not present

## 2017-06-17 DIAGNOSIS — F17210 Nicotine dependence, cigarettes, uncomplicated: Secondary | ICD-10-CM | POA: Diagnosis present

## 2017-06-17 DIAGNOSIS — Z8711 Personal history of peptic ulcer disease: Secondary | ICD-10-CM

## 2017-06-17 DIAGNOSIS — Z79899 Other long term (current) drug therapy: Secondary | ICD-10-CM

## 2017-06-17 DIAGNOSIS — Z7951 Long term (current) use of inhaled steroids: Secondary | ICD-10-CM | POA: Diagnosis not present

## 2017-06-17 DIAGNOSIS — E669 Obesity, unspecified: Secondary | ICD-10-CM | POA: Diagnosis not present

## 2017-06-17 DIAGNOSIS — I252 Old myocardial infarction: Secondary | ICD-10-CM | POA: Diagnosis not present

## 2017-06-17 DIAGNOSIS — R079 Chest pain, unspecified: Secondary | ICD-10-CM | POA: Diagnosis present

## 2017-06-17 DIAGNOSIS — Z87891 Personal history of nicotine dependence: Secondary | ICD-10-CM | POA: Diagnosis not present

## 2017-06-17 DIAGNOSIS — J410 Simple chronic bronchitis: Secondary | ICD-10-CM | POA: Diagnosis not present

## 2017-06-17 DIAGNOSIS — I959 Hypotension, unspecified: Secondary | ICD-10-CM | POA: Diagnosis present

## 2017-06-17 DIAGNOSIS — R609 Edema, unspecified: Secondary | ICD-10-CM | POA: Diagnosis not present

## 2017-06-17 DIAGNOSIS — Z8249 Family history of ischemic heart disease and other diseases of the circulatory system: Secondary | ICD-10-CM

## 2017-06-17 DIAGNOSIS — J449 Chronic obstructive pulmonary disease, unspecified: Secondary | ICD-10-CM | POA: Diagnosis present

## 2017-06-17 DIAGNOSIS — I2510 Atherosclerotic heart disease of native coronary artery without angina pectoris: Secondary | ICD-10-CM | POA: Diagnosis present

## 2017-06-17 DIAGNOSIS — K219 Gastro-esophageal reflux disease without esophagitis: Secondary | ICD-10-CM | POA: Diagnosis present

## 2017-06-17 HISTORY — DX: Non-ST elevation (NSTEMI) myocardial infarction: I21.4

## 2017-06-17 HISTORY — DX: Atherosclerotic heart disease of native coronary artery without angina pectoris: I25.10

## 2017-06-17 LAB — URINALYSIS, ROUTINE W REFLEX MICROSCOPIC
BACTERIA UA: NONE SEEN
BILIRUBIN URINE: NEGATIVE
Glucose, UA: NEGATIVE mg/dL
Ketones, ur: NEGATIVE mg/dL
Leukocytes, UA: NEGATIVE
NITRITE: NEGATIVE
PROTEIN: NEGATIVE mg/dL
RBC / HPF: NONE SEEN RBC/hpf (ref 0–5)
Specific Gravity, Urine: 1.017 (ref 1.005–1.030)
pH: 5 (ref 5.0–8.0)

## 2017-06-17 LAB — CREATININE, SERUM
Creatinine, Ser: 0.57 mg/dL (ref 0.44–1.00)
GFR calc Af Amer: >60 mL/min (ref >60)
GFR calc non Af Amer: >60 mL/min (ref >60)

## 2017-06-17 LAB — I-STAT TROPONIN, ED: Troponin i, poc: 0 ng/mL (ref 0.00–0.08)

## 2017-06-17 LAB — CBC
HCT: 43.2 % (ref 36.0–46.0)
HEMATOCRIT: 42.1 % (ref 36.0–46.0)
HEMOGLOBIN: 13.9 g/dL (ref 12.0–15.0)
Hemoglobin: 13.9 g/dL (ref 12.0–15.0)
MCH: 31.4 pg (ref 26.0–34.0)
MCH: 30.3 pg (ref 26.0–34.0)
MCHC: 33 g/dL (ref 30.0–36.0)
MCHC: 32.2 g/dL (ref 30.0–36.0)
MCV: 95 fL (ref 78.0–100.0)
MCV: 94.3 fL (ref 78.0–100.0)
PLATELETS: 278 10*3/uL (ref 150–400)
Platelets: 254 10*3/uL (ref 150–400)
RBC: 4.43 MIL/uL (ref 3.87–5.11)
RBC: 4.58 MIL/uL (ref 3.87–5.11)
RDW: 13.5 % (ref 11.5–15.5)
RDW: 13.6 % (ref 11.5–15.5)
WBC: 12.4 10*3/uL — AB (ref 4.0–10.5)
WBC: 12.5 10*3/uL — ABNORMAL HIGH (ref 4.0–10.5)

## 2017-06-17 LAB — TROPONIN I

## 2017-06-17 LAB — BASIC METABOLIC PANEL
ANION GAP: 11 (ref 5–15)
BUN: 16 mg/dL (ref 6–20)
CHLORIDE: 104 mmol/L (ref 101–111)
CO2: 22 mmol/L (ref 22–32)
Calcium: 9 mg/dL (ref 8.9–10.3)
Creatinine, Ser: 0.57 mg/dL (ref 0.44–1.00)
Glucose, Bld: 119 mg/dL — ABNORMAL HIGH (ref 65–99)
POTASSIUM: 4 mmol/L (ref 3.5–5.1)
SODIUM: 137 mmol/L (ref 135–145)

## 2017-06-17 LAB — BRAIN NATRIURETIC PEPTIDE: B Natriuretic Peptide: 60.7 pg/mL (ref 0.0–100.0)

## 2017-06-17 LAB — LACTIC ACID, PLASMA: LACTIC ACID, VENOUS: 2.1 mmol/L — AB (ref 0.5–1.9)

## 2017-06-17 LAB — MRSA PCR SCREENING: MRSA by PCR: NEGATIVE

## 2017-06-17 MED ORDER — ENOXAPARIN SODIUM 40 MG/0.4ML ~~LOC~~ SOLN
40.0000 mg | SUBCUTANEOUS | Status: DC
  Administered 2017-06-17 – 2017-06-18 (×2): 40 mg via SUBCUTANEOUS
  Filled 2017-06-17 (×2): qty 0.4

## 2017-06-17 MED ORDER — ACETAMINOPHEN 650 MG RE SUPP: 650.0000 mg | Freq: Four times a day (QID) | RECTAL | Status: DC | PRN

## 2017-06-17 MED ORDER — DEXTROSE 5 % IV SOLN
1.0000 g | INTRAVENOUS | Status: DC
  Administered 2017-06-17: 1 g via INTRAVENOUS
  Filled 2017-06-17 (×2): qty 10

## 2017-06-17 MED ORDER — IOPAMIDOL (ISOVUE-370) INJECTION 76%
INTRAVENOUS | Status: AC
  Administered 2017-06-17: 100 mL
  Filled 2017-06-17: qty 100

## 2017-06-17 MED ORDER — SODIUM CHLORIDE 0.9 % IV SOLN: 250.0000 mL | INTRAVENOUS | Status: DC | PRN

## 2017-06-17 MED ORDER — POLYETHYLENE GLYCOL 3350 17 G PO PACK: 17.0000 g | PACK | Freq: Every day | ORAL | Status: DC | PRN

## 2017-06-17 MED ORDER — FUROSEMIDE 10 MG/ML IJ SOLN
40.0000 mg | Freq: Once | INTRAMUSCULAR | Status: AC
  Administered 2017-06-17: 40 mg via INTRAVENOUS
  Filled 2017-06-17: qty 4

## 2017-06-17 MED ORDER — MOMETASONE FURO-FORMOTEROL FUM 200-5 MCG/ACT IN AERO
2.0000 | INHALATION_SPRAY | Freq: Two times a day (BID) | RESPIRATORY_TRACT | Status: DC
Start: 2017-06-17 — End: 2017-06-19
  Administered 2017-06-18 – 2017-06-19 (×3): 2 via RESPIRATORY_TRACT
  Filled 2017-06-17: qty 8.8

## 2017-06-17 MED ORDER — METOPROLOL TARTRATE 12.5 MG HALF TABLET
12.5000 mg | ORAL_TABLET | Freq: Two times a day (BID) | ORAL | Status: DC
  Administered 2017-06-17 – 2017-06-19 (×4): 12.5 mg via ORAL
  Filled 2017-06-17 (×4): qty 1

## 2017-06-17 MED ORDER — VENLAFAXINE HCL ER 75 MG PO CP24
75.0000 mg | ORAL_CAPSULE | Freq: Every day | ORAL | Status: DC
  Administered 2017-06-18 – 2017-06-19 (×2): 75 mg via ORAL
  Filled 2017-06-17 (×2): qty 1

## 2017-06-17 MED ORDER — SODIUM CHLORIDE 0.9 % IV BOLUS (SEPSIS)
500.0000 mL | Freq: Once | INTRAVENOUS | Status: AC
  Administered 2017-06-17: 500 mL via INTRAVENOUS

## 2017-06-17 MED ORDER — HYDROMORPHONE HCL 1 MG/ML IJ SOLN
1.0000 mg | INTRAMUSCULAR | Status: DC | PRN
  Administered 2017-06-17 – 2017-06-18 (×5): 1 mg via INTRAVENOUS
  Filled 2017-06-17 (×5): qty 1

## 2017-06-17 MED ORDER — ASPIRIN EC 325 MG PO TBEC
325.0000 mg | DELAYED_RELEASE_TABLET | Freq: Every day | ORAL | Status: DC
  Administered 2017-06-17 – 2017-06-19 (×3): 325 mg via ORAL
  Filled 2017-06-17 (×3): qty 1

## 2017-06-17 MED ORDER — ACETAMINOPHEN 325 MG PO TABS: 650.0000 mg | ORAL_TABLET | Freq: Four times a day (QID) | ORAL | Status: DC | PRN

## 2017-06-17 MED ORDER — SODIUM CHLORIDE 0.9 % IV BOLUS (SEPSIS)
1000.0000 mL | Freq: Once | INTRAVENOUS | Status: AC
  Administered 2017-06-17: 1000 mL via INTRAVENOUS

## 2017-06-17 MED ORDER — ONDANSETRON HCL 4 MG PO TABS: 4.0000 mg | ORAL_TABLET | Freq: Four times a day (QID) | ORAL | Status: DC | PRN

## 2017-06-17 MED ORDER — OXYCODONE HCL 5 MG PO TABS
5.0000 mg | ORAL_TABLET | ORAL | Status: DC | PRN
  Administered 2017-06-17: 5 mg via ORAL
  Filled 2017-06-17: qty 1

## 2017-06-17 MED ORDER — FENTANYL CITRATE (PF) 100 MCG/2ML IJ SOLN
25.0000 ug | Freq: Once | INTRAMUSCULAR | Status: AC
  Administered 2017-06-17: 25 ug via INTRAVENOUS
  Filled 2017-06-17: qty 2

## 2017-06-17 MED ORDER — SODIUM CHLORIDE 0.9% FLUSH
3.0000 mL | Freq: Two times a day (BID) | INTRAVENOUS | Status: DC
  Administered 2017-06-17 – 2017-06-18 (×3): 3 mL via INTRAVENOUS

## 2017-06-17 MED ORDER — ALBUTEROL SULFATE (2.5 MG/3ML) 0.083% IN NEBU: 2.5000 mg | INHALATION_SOLUTION | Freq: Four times a day (QID) | RESPIRATORY_TRACT | Status: DC | PRN

## 2017-06-17 MED ORDER — NICOTINE 14 MG/24HR TD PT24
14.0000 mg | MEDICATED_PATCH | Freq: Every day | TRANSDERMAL | Status: DC
  Administered 2017-06-17 – 2017-06-19 (×3): 14 mg via TRANSDERMAL
  Filled 2017-06-17 (×3): qty 1

## 2017-06-17 MED ORDER — SODIUM CHLORIDE 0.9% FLUSH: 3.0000 mL | INTRAVENOUS | Status: DC | PRN

## 2017-06-17 MED ORDER — ATORVASTATIN CALCIUM 80 MG PO TABS
80.0000 mg | ORAL_TABLET | Freq: Every day | ORAL | Status: DC
  Administered 2017-06-17 – 2017-06-18 (×2): 80 mg via ORAL
  Filled 2017-06-17 (×2): qty 1

## 2017-06-17 MED ORDER — PANTOPRAZOLE SODIUM 40 MG PO TBEC
40.0000 mg | DELAYED_RELEASE_TABLET | Freq: Every day | ORAL | Status: DC
Start: 2017-06-17 — End: 2017-06-18
  Administered 2017-06-17 – 2017-06-18 (×2): 40 mg via ORAL
  Filled 2017-06-17 (×2): qty 1

## 2017-06-17 MED ORDER — KETOROLAC TROMETHAMINE 30 MG/ML IJ SOLN
30.0000 mg | Freq: Four times a day (QID) | INTRAMUSCULAR | Status: AC
  Administered 2017-06-17 – 2017-06-18 (×4): 30 mg via INTRAVENOUS
  Filled 2017-06-17 (×4): qty 1

## 2017-06-17 MED ORDER — ONDANSETRON HCL 4 MG/2ML IJ SOLN: 4.0000 mg | Freq: Four times a day (QID) | INTRAMUSCULAR | Status: DC | PRN

## 2017-06-17 MED ORDER — HYDROMORPHONE HCL 1 MG/ML IJ SOLN
1.0000 mg | INTRAMUSCULAR | Status: AC
Start: 2017-06-17 — End: 2017-06-17
  Administered 2017-06-17: 1 mg via INTRAVENOUS
  Filled 2017-06-17: qty 1

## 2017-06-17 NOTE — Telephone Encounter (Signed)
Ms. Sharon Cochran has called the office with complaints if heart palpitations and chest discomfort. The palpitations are new. The chest dIscomfort/pain is unlike her usual post op pain s/p CABG 05/22/17. She has had f/u with cardiology and Dr. Dorris FetchHendrickson. I advised her to go to the ED and she agreed.

## 2017-06-17 NOTE — Progress Notes (Signed)
Dr. Darnelle Catalanama text paged : re LDH = 2.1

## 2017-06-17 NOTE — ED Provider Notes (Signed)
[facility]-EMERGENCY DEPT Provider Note   CSN: [id number] Arrival date & time: 06/17/17  1103     History   Chief Complaint Chief Complaint  Patient presents with  . Chest Pain    HPI Sharon Cochran is a 61 y.o. female.  The history is provided by the patient and medical records. No language interpreter was used.  Chest Pain   This is a new problem. The current episode started 3 to 5 hours ago. The problem occurs constantly. The problem has not changed since onset.The pain is associated with breathing. The pain is present in the substernal region. The pain is at a severity of 10/10. The pain is severe. The quality of the pain is described as heavy, pleuritic and pressure-like. The pain radiates to the left shoulder, left neck and left jaw. Duration of episode(s) is 3 hours. The symptoms are aggravated by deep breathing. Associated symptoms include shortness of breath. Pertinent negatives include no abdominal pain, no back pain, no cough, no diaphoresis, no fever, no headaches, no hemoptysis, no lower extremity edema, no numbness, no palpitations and no syncope. She has tried nitroglycerin for the symptoms. The treatment provided no relief.  Her past medical history is significant for CAD and MI.    Past Medical History:  Diagnosis Date  . COPD (chronic obstructive pulmonary disease) (HCC)   . Coronary artery disease   . GERD (gastroesophageal reflux disease)     Patient Active Problem List   Diagnosis Date Noted  . 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 Non-STEMI (non-ST elevated myocardial infarction) (HCC) 05/20/2017  . Epigastric pain   . Unstable angina (HCC)   . CAD in native artery   . Chest pain with moderate risk of acute coronary syndrome 05/19/2017  . Troponin level elevated 05/19/2017  . GERD (gastroesophageal reflux disease) 05/19/2017  . Smoker 05/19/2017  . COPD (chronic obstructive pulmonary disease) (HCC) 05/19/2017    Past Surgical History:  Procedure Laterality Date   . CORONARY ARTERY BYPASS GRAFT N/A 05/22/2017   Procedure: OFF PUMP CORONARY ARTERY BYPASS GRAFTING (CABG) x1 using left internal mammary artery;  Surgeon: Loreli Slot, MD;  Location: [facility];  Service: Open Heart Surgery;  Laterality: N/A;  . INTRAVASCULAR PRESSURE WIRE/FFR STUDY N/A 05/20/2017   Procedure: Intravascular Pressure Wire/FFR Study;  Surgeon: Yvonne Kendall, MD;  Location: [facility] INVASIVE CV LAB;  Service: Cardiovascular;  Laterality: N/A;  . LEFT HEART CATH AND CORONARY ANGIOGRAPHY N/A 05/20/2017   Procedure: Left Heart Cath and Coronary Angiography;  Surgeon: Yvonne Kendall, MD;  Location: [facility] INVASIVE CV LAB;  Service: Cardiovascular;  Laterality: N/A;  . TEE WITHOUT CARDIOVERSION N/A 05/22/2017   Procedure: TRANSESOPHAGEAL ECHOCARDIOGRAM (TEE);  Surgeon: Loreli Slot, MD;  Location: [facility] OR;  Service: Open Heart Surgery;  Laterality: N/A;  . TUBAL LIGATION      OB History    No data available       Home Medications    Prior to Admission medications   Medication Sig Start Date End Date Taking? Authorizing Provider  albuterol (PROVENTIL HFA;VENTOLIN HFA) 108 (90 Base) MCG/ACT inhaler Inhale 1-2 puffs into the lungs every 6 (six) hours as needed for wheezing or shortness of breath.    [provider]  albuterol (PROVENTIL) (2.5 MG/3ML) 0.083% nebulizer solution Take 2.5 mg by nebulization every 6 (six) hours as needed for wheezing or shortness of breath.    [provider]  aspirin EC 325 MG EC tablet Take 1 tablet (325 mg  total) by mouth daily. 05/27/17   Ardelle Balls, PA-C  atorvastatin (LIPITOR) 80 MG tablet Take 1 tablet (80 mg total) by mouth daily at 6 PM. 05/26/17   Ardelle Balls, PA-C  diclofenac (CATAFLAM) 50 MG tablet Take 50 mg by mouth 2 (two) times daily. 05/09/17   [provider]  Fluticasone-Salmeterol (ADVAIR) 250-50 MCG/DOSE AEPB Inhale 1 puff into the lungs 2 (two) times daily.    [provider]    furosemide (LASIX) 20 MG tablet Take 1 tablet (20 mg total) by mouth daily. 06/06/17 09/04/17  Janetta Hora, PA-C  metoprolol tartrate (LOPRESSOR) 25 MG tablet Take 0.5 tablets (12.5 mg total) by mouth 2 (two) times daily. 06/06/17   Janetta Hora, PA-C  metroNIDAZOLE (METROGEL) 1 % gel Apply 1 application topically at bedtime.    [provider]  nicotine (NICODERM CQ - DOSED IN MG/24 HOURS) 14 mg/24hr patch Place 1 patch (14 mg total) onto the skin daily. 05/27/17   Ardelle Balls, PA-C  omeprazole (PRILOSEC) 20 MG capsule Take 20 mg by mouth daily.    [provider]  oxyCODONE (OXY IR/ROXICODONE) 5 MG immediate release tablet Take 5 mg by mouth every 4-6 hours PRN severe pain. 05/26/17   Ardelle Balls, PA-C  promethazine (PHENERGAN) 12.5 MG tablet Take 1 tablet (12.5 mg total) by mouth every 6 (six) hours as needed for nausea or vomiting. 05/27/17   Loreli Slot, MD  traZODone (DESYREL) 50 MG tablet Take 50 mg by mouth at bedtime.    [provider]  venlafaxine XR (EFFEXOR-XR) 75 MG 24 hr capsule Take 75 mg by mouth daily with breakfast.    [provider]    Family History Family History  Problem Relation Age of Onset  . Hypertension Mother     Social History Social History  Substance Use Topics  . Smoking status: Former Smoker    Packs/day: 1.00    Types: Cigarettes  . Smokeless tobacco: Never Used  . Alcohol use 2.4 oz/week    4 Shots of liquor per week     Allergies   No known allergies   Review of Systems Review of Systems  Constitutional: Negative for chills, diaphoresis and fever.  HENT: Negative for congestion and rhinorrhea.   Respiratory: Positive for shortness of breath. Negative for cough, hemoptysis, chest tightness, wheezing and stridor.   Cardiovascular: Positive for chest pain. Negative for palpitations, leg swelling and syncope.  Gastrointestinal: Negative for abdominal pain.   Genitourinary: Negative for flank pain.  Musculoskeletal: Negative for back pain, neck pain and neck stiffness.  Skin: Positive for wound (well-healing CABG wound). Negative for rash.  Neurological: Negative for numbness and headaches.  All other systems reviewed and are negative.    Physical Exam Updated Vital Signs BP 95/64 (BP Location: Right Arm)   Pulse 97   Temp 98.5 F (36.9 C) (Oral)   Resp 17   Ht 5' (1.524 m)   Wt 70.8 kg (156 lb)   SpO2 96%   BMI 30.47 kg/m   Physical Exam  Constitutional: She appears well-developed and well-nourished. No distress.  HENT:  Head: Normocephalic and atraumatic.  Mouth/Throat: Oropharynx is clear and moist. No oropharyngeal exudate.  Eyes: Conjunctivae are normal.  Neck: Neck supple.  Cardiovascular: Normal rate, regular rhythm and intact distal pulses.   No murmur heard. Pulmonary/Chest: Effort normal and breath sounds normal. No respiratory distress. She has no wheezes. She has no rales. She exhibits  tenderness.  Abdominal: Soft. There is no tenderness.  Musculoskeletal: She exhibits no edema or tenderness.  Neurological: She is alert. No sensory deficit. She exhibits normal muscle tone.  Skin: Skin is warm and dry. Capillary refill takes less than 2 seconds. No rash noted. She is not diaphoretic. No erythema.  Psychiatric: She has a normal mood and affect.  Nursing note and vitals reviewed.    ED Treatments / Results  Labs (all labs ordered are listed, but only abnormal results are displayed) Labs Reviewed  BASIC METABOLIC PANEL - Abnormal; Notable for the following:       Result Value   Glucose, Bld 119 (*)    All other components within normal limits  CBC - Abnormal; Notable for the following:    WBC 12.4 (*)    All other components within normal limits  LACTIC ACID, PLASMA - Abnormal; Notable for the following:    Lactic Acid, Venous 2.1 (*)    All other components within normal limits  CULTURE, BLOOD (ROUTINE X 2)   CULTURE, BLOOD (ROUTINE X 2)  URINE CULTURE  MRSA PCR SCREENING  BRAIN NATRIURETIC PEPTIDE  CBC  CREATININE, SERUM  URINALYSIS, ROUTINE W REFLEX MICROSCOPIC  TROPONIN I  TROPONIN I  TROPONIN I  I-STAT TROPOININ, ED    EKG  EKG Interpretation  Date/Time:  Monday June 17 2017 11:03:40 EDT Ventricular Rate:  94 PR Interval:    QRS Duration: 78 QT Interval:  416 QTC Calculation: 521 R Axis:   -13 Text Interpretation:  Sinus rhythm Ventricular premature complex Aberrant conduction of SV complex(es) Abnormal R-wave progression, early transition Nonspecific T abnrm, anterolateral leads Prolonged QT interval New T wave inversion in lead V2, V3 No STEMI Reconfirmed by Theda Belfast ([id number]) on 06/17/2017 12:13:56 PM       Radiology Ct Angio Chest Pe W Or Wo Contrast  Result Date: 06/17/2017 CLINICAL DATA:  61 year old female with acute chest pain radiating to left shoulder neck and face. Patient with CABG on 05/22/2017. EXAM: CT ANGIOGRAPHY CHEST WITH CONTRAST TECHNIQUE: Multidetector CT imaging of the chest was performed using the standard protocol during bolus administration of intravenous contrast. Multiplanar CT image reconstructions and MIPs were obtained to evaluate the vascular anatomy. CONTRAST:  100 cc intravenous Isovue 370 COMPARISON:  05/19/2017 chest CT.  06/17/2017 and prior radiographs FINDINGS: Cardiovascular: This is a technically adequate study but respiratory motion artifact limits sensitivity in several portions of the lungs, greatest in the lower lobes. No pulmonary emboli are identified. There is no evidence of thoracic aortic aneurysm. Recent CABG changes identified. Small amount of pericardial fluid noted. Mediastinum/Nodes: No mediastinal hematoma. No enlarged mediastinal, hilar, or axillary lymph nodes. Thyroid gland, trachea, and esophagus demonstrate no significant findings. Lungs/Pleura: A small left pleural effusion and bilateral lower lobe atelectasis, moderate on  the left and mild to moderate on the right. Mild central ground-glass opacities may represent mild interstitial edema. No definite airspace disease noted. There is no evidence of pneumothorax. Upper Abdomen: No acute abnormality Musculoskeletal: No acute abnormality or suspicious lesions. Patient is status post median sternotomy. Review of the MIP images confirms the above findings. IMPRESSION: No evidence of pulmonary emboli or thoracic aortic aneurysm. Mild central ground-glass opacities which may represent mild interstitial pulmonary edema. Status post CABG with small left pleural effusion, moderate left lower lobe and mild to moderate right lower lobe atelectasis. Electronically Signed   By: Harmon Pier M.D.   On: 06/17/2017 15:35   Dg Chest Portable 1  View  Result Date: 06/17/2017 CLINICAL DATA:  Pain. EXAM: PORTABLE CHEST 1 VIEW COMPARISON:  06/06/2017 . FINDINGS: Prior CABG. Heart size stable. Mild bibasilar subsegmental atelectasis and/or scarring again noted. No pleural effusion or pneumothorax . IMPRESSION: 1.  Prior CABG.  Heart size stable. 2. Mild bibasilar subsegmental atelectasis and/or scarring. Electronically Signed   By: Maisie Fushomas  Register   On: 06/17/2017 11:39    Procedures Procedures (including critical care time)  Medications Ordered in ED Medications  sodium chloride 0.9 % bolus 1,000 mL (0 mLs Intravenous Stopped 06/17/17 1333)  fentaNYL (SUBLIMAZE) injection 25 mcg (25 mcg Intravenous Given 06/17/17 1218)  sodium chloride 0.9 % bolus 500 mL (0 mLs Intravenous Stopped 06/17/17 1443)  fentaNYL (SUBLIMAZE) injection 25 mcg (25 mcg Intravenous Given 06/17/17 1443)  iopamidol (ISOVUE-370) 76 % injection (100 mLs  Contrast Given 06/17/17 1501)  HYDROmorphone (DILAUDID) injection 1 mg (1 mg Intravenous Given 06/17/17 1646)  furosemide (LASIX) injection 40 mg (40 mg Intravenous Given 06/17/17 1649)     Initial Impression / Assessment and Plan / ED Course  I have reviewed the triage vital signs  and the nursing notes.  Pertinent labs & imaging results that were available during my care of the patient were reviewed by me and considered in my medical decision making (see chart for details).     Rachael FeeLaura L Ciavarella is a 61 y.o. female  with a past medical history significant for COPD, CAD, and recent MI with CABG within the last month who presents with chest pain and shortness of breath. Patient says that she has been feeling well over the last few weeks after her CABG. She says that last 2 days, she has had some chills but had no shortness of breath or chest pain. She says that this morning, she was awakened at 7 AM with crushing chest pain. She describes as pressure in her central chest, radiating to her left shoulder and left jaw. She says this feels "much worse" than her prior heart attack. She reports associated shortness of breath and palpitations. She denies any nausea or vomiting but does report some diaphoresis. She took 4 baby aspirin and then had 2 nitroglycerin glycerin with EMS. She reports her chest pain is still a 10 out of 10 in severity.   He denies any abdominal pain, constipation, diarrhea, or dysuria. She denies any other complaints on arrival. She denies any recent chest trauma.  History and exam are seen above. On exam, lungs are clear. Chest is slightly tender to palpation near her surgical site. No crepitance. Abdomen nontender. No significant lower extremity edema. Pulses symmetric in both upper extremity.  Initial EKG showed prolonged QTC and a T-wave inversion in V2 that appears to be new. No STEMI.   Given patient's recent CABG and new chest pain, patient will likely require admission for further workup and management. Patient will have x-ray and laboratory testing. Cardiology will be called following workup.  Patient's blood pressure was 78 systolic on reassessment. Patient was given 1 L normal saline.      Cardiology was called who recommended admission to  hospitalist service for further observation and management. CT surgery team will see the patient tomorrow. Cardiology will continue to follow. They recommended trending troponins and observation. They ordered a CTA to look for PE. This was considered by our team but will defer to admitting team.  Patient reported intermittent relief of pain with fentanyl. Patient was given fluids for soft blood pressure.  Patient admitted in  stable condition for further management.   Final Clinical Impressions(s) / ED Diagnoses   Final diagnoses:  Chest pain    New Prescriptions Current Discharge Medication List      Clinical Impression: 1. Chest pain     Disposition: Admit to Hospitalist service    Yurika Pereda, Canary Brim, MD 06/17/17 [phone]

## 2017-06-17 NOTE — Consult Note (Addendum)
Patient ID: Sharon Cochran MRN: [id number], DOB/AGE: 1956/11/01   Admit date: 06/17/2017   Primary Physician: System, Pcp Not In Primary Cardiologist: Dr. Katrinka BlazingSmith (while in [place]) she lives in [place]. Reason for admission: chest pain   HPI:  Sharon Cochran is a 61 y.o. female who is being seen today for the evaluation of chest pain at the request of Dr. Rush Landmarkegeler.   Sharon Cochran is a 61 y.o. female with a history of COPD, gastric ulcers, long term tobacco abuse and recently diagnosed CAD s/p CABG x1V who presented to [facility] ED today with chest pain.   She was recently admitted 6/3-6/10/18 for NSTEMI. (Peak troponin 0.92). Cath showed a 80-90% ostial LAD stenosis (per Dr. Sunday CornHendrickson's note, however cath says 60% ostial stenosis). FFR was performed and was 0.88. It was felt that she might be able to be managed medically. However she had 3 additional episodes of chest pressure post catheterization and had be placed back on intravenous heparin and nitroglycerin. 2D ECHO showed EF 60-65%, G1DD, mild MR. She ultimately underwent CABG x1 V (LIMA --> LAD) on 05/22/17 by Dr. Dorris FetchHendrickson. She is from [place] but was in town visiting for her nephews graduation. Post op course was uncomplicated. She did have some pleural effusions on CXR and she was discharged on a short course of lasix.   I saw her in the office on 06/06/17 for post hospital follow up. She was feeling okay but had sharp/stabbing pain under her rib cage with a deep breath. Follow up CXR showed pleural effusions. I Rx'd a short course of lasix and she had improvement in symptoms. Also her BP was soft so I decreased lasix from 25mg  BID--> 12.5mg  BID.   She was in her usual state of health until this AM when she was getting up to go to the bathroom and had sudden onset of 10/10 chest pain that radiated into her left shoulder, jaw and neck. The pain is worse with a deep breath in and "much worse" than her previous pain when she had  NSTEMI. Worse with a deep breath in and when she turns over. She also complains of SOB. She denies any nausea or vomiting but does report some diaphoresis. She took 4 baby aspirin and then had 2 nitroglycerin glycerin with EMS. She reports her chest pain is still a 10 out of 10 in severity. Her BP is soft limiting pain medication use. She was given some IV fluids with some improvement and Fentanyl which has help take the edge off the pain.   No LE edema, orthopnea or PND. No dizziness or syncope. No blood in stool or urine.     Problem List  Past Medical History:  Diagnosis Date  . COPD (chronic obstructive pulmonary disease) (HCC)   . Coronary artery disease   . GERD (gastroesophageal reflux disease)     Past Surgical History:  Procedure Laterality Date  . CORONARY ARTERY BYPASS GRAFT N/A 05/22/2017   Procedure: OFF PUMP CORONARY ARTERY BYPASS GRAFTING (CABG) x1 using left internal mammary artery;  Surgeon: Loreli SlotHendrickson, Steven C, MD;  [facility];  Service: Open Heart Surgery;  Laterality: N/A;  . INTRAVASCULAR PRESSURE WIRE/FFR STUDY N/A 05/20/2017   Procedure: Intravascular Pressure Wire/FFR Study;  Surgeon: Yvonne KendallEnd, Christopher, MD;  Location: [facility] INVASIVE CV LAB;  Service: Cardiovascular;  Laterality: N/A;  . LEFT HEART CATH AND CORONARY ANGIOGRAPHY N/A 05/20/2017   Procedure: Left Heart Cath and Coronary Angiography;  Surgeon: Yvonne KendallEnd, Christopher,  MD;  Location: [facility] INVASIVE CV LAB;  Service: Cardiovascular;  Laterality: N/A;  . TEE WITHOUT CARDIOVERSION N/A 05/22/2017   Procedure: TRANSESOPHAGEAL ECHOCARDIOGRAM (TEE);  Surgeon: Loreli Slot, MD;  Location: [facility] OR;  Service: Open Heart Surgery;  Laterality: N/A;  . TUBAL LIGATION       Allergies  Allergies  Allergen Reactions  . No Known Allergies      Home Medications  Prior to Admission medications   Medication Sig Start Date End Date Taking? Authorizing Provider  albuterol (PROVENTIL HFA;VENTOLIN HFA) 108 (90 Base) MCG/ACT  inhaler Inhale 1-2 puffs into the lungs every 6 (six) hours as needed for wheezing or shortness of breath.    [provider]  albuterol (PROVENTIL) (2.5 MG/3ML) 0.083% nebulizer solution Take 2.5 mg by nebulization every 6 (six) hours as needed for wheezing or shortness of breath.    [provider]  aspirin EC 325 MG EC tablet Take 1 tablet (325 mg total) by mouth daily. 05/27/17   Ardelle Balls, PA-C  atorvastatin (LIPITOR) 80 MG tablet Take 1 tablet (80 mg total) by mouth daily at 6 PM. 05/26/17   Ardelle Balls, PA-C  diclofenac (CATAFLAM) 50 MG tablet Take 50 mg by mouth 2 (two) times daily. 05/09/17   [provider]  Fluticasone-Salmeterol (ADVAIR) 250-50 MCG/DOSE AEPB Inhale 1 puff into the lungs 2 (two) times daily.    [provider]  furosemide (LASIX) 20 MG tablet Take 1 tablet (20 mg total) by mouth daily. 06/06/17 09/04/17  Janetta Hora, PA-C  metoprolol tartrate (LOPRESSOR) 25 MG tablet Take 0.5 tablets (12.5 mg total) by mouth 2 (two) times daily. 06/06/17   Janetta Hora, PA-C  metroNIDAZOLE (METROGEL) 1 % gel Apply 1 application topically at bedtime.    [provider]  nicotine (NICODERM CQ - DOSED IN MG/24 HOURS) 14 mg/24hr patch Place 1 patch (14 mg total) onto the skin daily. 05/27/17   Ardelle Balls, PA-C  omeprazole (PRILOSEC) 20 MG capsule Take 20 mg by mouth daily.    [provider]  oxyCODONE (OXY IR/ROXICODONE) 5 MG immediate release tablet Take 5 mg by mouth every 4-6 hours PRN severe pain. 05/26/17   Ardelle Balls, PA-C  promethazine (PHENERGAN) 12.5 MG tablet Take 1 tablet (12.5 mg total) by mouth every 6 (six) hours as needed for nausea or vomiting. 05/27/17   Loreli Slot, MD  traZODone (DESYREL) 50 MG tablet Take 50 mg by mouth at bedtime.    [provider]  venlafaxine XR (EFFEXOR-XR) 75 MG 24 hr capsule Take 75 mg by mouth daily with breakfast.    [provider]    Family History  Family History  Problem Relation Age of Onset  . Hypertension Mother    Family Status  Relation Status  . Mother Deceased  . Father Deceased     Social History  Social History   Social History  . Marital status: Married    Spouse name: N/A  . Number of children: N/A  . Years of education: N/A   Occupational History  . Not on file.   Social History Main Topics  . Smoking status: Former Smoker    Packs/day: 1.00    Types: Cigarettes  . Smokeless tobacco: Never Used  . Alcohol use 2.4 oz/week    4 Shots of liquor per week  . Drug use: No  . Sexual activity: Not on file   Other Topics Concern  .  Not on file   Social History Narrative  . No narrative on file     Review of Systems General:  No chills, fever, night sweats or weight changes.  Cardiovascular:+++ chest pain, +++dyspnea on exertion, No edema, orthopnea, palpitations, paroxysmal nocturnal dyspnea. Dermatological: No rash, lesions/masses Respiratory: No cough, dyspnea Urologic: No hematuria, dysuria Abdominal:  ++ nausea, No vomiting, diarrhea, bright red blood per rectum, melena, or hematemesis Neurologic:  No visual changes, wkns, changes in mental status. All other systems reviewed and are otherwise negative except as noted above.  Physical Exam  Blood pressure 96/60, pulse (!) 103, temperature 98.5 F (36.9 C), temperature source Oral, resp. rate 19, height 5' (1.524 m), weight 156 lb (70.8 kg), SpO2 92 %.  General: Pleasant, NAD, obese, appears in distress Psych: Normal affect. Neuro: Alert and oriented X 3. Moves all extremities spontaneously. HEENT: Normal  Neck: Supple without bruits or JVD. Lungs:  Resp regular and unlabored, CTA. Heart: RR. tachy no s3, s4, or murmurs. Abdomen: Soft, non-tender, non-distended, BS + x 4.  Extremities: No clubbing, cyanosis or edema. DP/PT/Radials 2+ and equal bilaterally.  Labs  No results for input(s): CKTOTAL,  CKMB, TROPONINI in the last 72 hours. Lab Results  Component Value Date   WBC 12.4 (H) 06/17/2017   HGB 13.9 06/17/2017   HCT 42.1 06/17/2017   MCV 95.0 06/17/2017   PLT 254 06/17/2017    Recent Labs Lab 06/17/17 1112  NA 137  K 4.0  CL 104  CO2 22  BUN 16  CREATININE 0.57  CALCIUM 9.0  GLUCOSE 119*   Lab Results  Component Value Date   CHOL 141 05/20/2017   HDL 32 (L) 05/20/2017   LDLCALC 87 05/20/2017   TRIG 111 05/20/2017   No results found for: DDIMER   Radiology/Studies  Dg Chest 2 View  Result Date: 06/06/2017 CLINICAL DATA:  CABG. EXAM: CHEST  2 VIEW COMPARISON:  05/25/2017 . FINDINGS: Prior median sternotomy and CABG. Heart size normal. Low lung volumes with mild basilar atelectasis in small pleural effusions. Improved from prior exam. No pneumothorax. IMPRESSION: 1. Low lung volumes with mild basilar atelectasis and pleural effusions. Improved from prior exam. 2.  Prior median sternotomy.  Heart size normal. Electronically Signed   By: Maisie Fus  Register   On: 06/06/2017 10:49   Dg Chest 2 View  Result Date: 05/25/2017 CLINICAL DATA:  Status post coronary artery bypass grafting. Pleural effusions. EXAM: CHEST  2 VIEW COMPARISON:  May 24, 2017. FINDINGS: Central catheter has been removed without evident pneumothorax. 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 No pneumothorax. There are persistent pleural effusions bilaterally with bilateral airspace consolidation. There is also atelectatic change in the left lower lobe. Heart is upper normal in size with pulmonary vascularity within normal limits. No evident adenopathy. No bone lesions. Patient is status post median sternotomy. IMPRESSION: No pneumothorax. Persistent pleural effusions bilaterally with patchy bibasilar airspace consolidation. Linear atelectasis left lower lobe. Stable cardiac silhouette. The consolidation in the lung bases may well represent a degree of pneumonia, although at least some of this  consolidation is felt to be due to atelectatic change. Electronically Signed   By: Bretta Bang III M.D.   On: 05/25/2017 07:29   Dg Chest 2 View  Result Date: 05/19/2017 CLINICAL DATA:  Epigastric abdominal pain beginning 2 days ago. History of COPD. EXAM: CHEST  2 VIEW COMPARISON:  None. FINDINGS: Normal cardiac silhouette and mediastinal contours. The lungs are hyperexpanded with flattening of  the bilateral hemidiaphragms. No focal parenchymal opacities. No pleural effusion or pneumothorax. No evidence of edema. No acute osseous abnormalities. IMPRESSION: Hyperexpanded lungs without acute cardiopulmonary disease. Specifically, no focal airspace opacities to suggest pneumonia. Electronically Signed   By: Simonne Come M.D.   On: 05/19/2017 12:51   Ct Chest W Contrast  Result Date: 05/19/2017 CLINICAL DATA:  Epigastric pain 72 hours after endoscopy. EXAM: CT CHEST WITHOUT AND WITH CONTRAST TECHNIQUE: Protocol was requested by GI. A without chest CT was obtained. Subsequently Multidetector CT imaging of the chest was performed during intravenous and oral contrast administration. CONTRAST:  75mL ISOVUE-300 IOPAMIDOL (ISOVUE-300) INJECTION 61%IV COMPARISON:  None. FINDINGS: Cardiovascular: Normal heart size. No pericardial effusion. No evidence of acute vascular disease. Mild atherosclerotic calcifications seen along the distal brachiocephalic. Mediastinum/Nodes: No pneumomediastinum, esophageal thickening, or extravasation of the oral contrast which is seen throughout the esophagus. Negative for adenopathy or swelling. Lungs/Pleura: Negative for pneumonia or air leak. Upper Abdomen: The stomach was covered and is negative for wall thickening. No epigastric pneumoperitoneum. Musculoskeletal: No acute or aggressive finding. Probable dermal inclusion cyst in the subcutaneous right paramedian lower back. IMPRESSION: No acute finding. There is no pneumomediastinum or extravasation of esophageal contrast. The  stomach was visualized and negative. Electronically Signed   By: Marnee Spring M.D.   On: 05/19/2017 20:40   Dg Chest Portable 1 View  Result Date: 06/17/2017 CLINICAL DATA:  Pain. EXAM: PORTABLE CHEST 1 VIEW COMPARISON:  06/06/2017 . FINDINGS: Prior CABG. Heart size stable. Mild bibasilar subsegmental atelectasis and/or scarring again noted. No pleural effusion or pneumothorax . IMPRESSION: 1.  Prior CABG.  Heart size stable. 2. Mild bibasilar subsegmental atelectasis and/or scarring. Electronically Signed   By: Maisie Fus  Register   On: 06/17/2017 11:39   Dg Chest Port 1 View  Result Date: 05/24/2017 CLINICAL DATA:  Status post coronary bypass grafting EXAM: PORTABLE CHEST 1 VIEW COMPARISON:  05/23/2017 FINDINGS: Cardiac shadow is mildly enlarged. Postsurgical changes are again seen. Right jugular central line is noted. The thoracostomy catheters have been removed in the interval. No pneumothorax is noted. Bilateral small pleural effusions are again seen slightly increased from the prior exam. No new focal abnormality is noted. IMPRESSION: Interval removal of bilateral chest tubes. Slight increase in bilateral pleural effusions. Electronically Signed   By: Alcide Clever M.D.   On: 05/24/2017 07:36   Dg Chest Port 1 View  Result Date: 05/23/2017 CLINICAL DATA:  Chest tube. EXAM: PORTABLE CHEST 1 VIEW COMPARISON:  05/22/2017, 05/19/2017.  CT 05/19/2017. FINDINGS: Interim removal of endotracheal tube and NG tube. Right IJ line and bilateral chest tubes in stable position . Prior CABG. Heart size stable. Low lung volumes with persistent bibasilar infiltrates and/or edema. Persistent small pleural effusions. No pneumothorax. IMPRESSION: 1. An removal of endotracheal tube and NG tube. Right IJ line and bilateral chest tubes in stable position. No pneumothorax. 2. Prior CABG. Heart size stable. Low lung volumes. Persistent bibasilar infiltrates and or edema and small bilateral pleural effusions. Electronically  Signed   By: Maisie Fus  Register   On: 05/23/2017 07:27   Dg Chest Port 1 View  Result Date: 05/22/2017 CLINICAL DATA:  Status post CABG. EXAM: PORTABLE CHEST 1 VIEW COMPARISON:  05/19/2017 FINDINGS: Endotracheal tube terminates 4.6 cm above carina.Right internal jugular line tip at mid SVC. Nasogastric tube extends beyond the inferior aspect of the film. Left chest tube and mediastinal drain versus right chest tube. Midline trachea. Cardiomegaly accentuated by AP portable technique. Small bilateral  pleural effusions. No pneumothorax. Mild pulmonary venous congestion, accentuated by low lung volumes. Left greater than right base airspace disease. IMPRESSION: Expected appearance after median sternotomy, with small bilateral pleural effusions and bibasilar airspace disease. Cardiomegaly and mild pulmonary venous congestion. Electronically Signed   By: Jeronimo Greaves M.D.   On: 05/22/2017 13:30    LHC: 05/20/17 Conclusions: 1. 60% ostial LAD stenosis that is not hemodynamically significant by FFR (0.88). Mild disease also noted in the distal LMCA, LCx, and RCA. 2. Focal mid inferior akinesis with otherwise preserved LV contraction. No culprit lesion to explain wall motion abnormality. Takotsubo variant would be a consideration. 3. Mildly elevated left ventricular filling pressure. Recommendations: 1. Aggressive medical therapy, including addition of high-intensity statin therapy and beta-blockade. 2. Proceed with echo to confirm inferior wall-motion abnormality.   TTE: 05/20/17 Study Conclusions - Left ventricle: The cavity size was normal. Systolic function was normal. The estimated ejection fraction was in the range of 60% to 65%. Wall motion was normal; there were no regional wall motion abnormalities. Doppler parameters are consistent with abnormal left ventricular relaxation (grade 1 diastolic dysfunction). There was no evidence of elevated ventricular filling pressure by Doppler  parameters. - Aortic valve: There was trivial regurgitation. - Mitral valve: Structurally normal valve. There was mild regurgitation. - Right ventricle: Systolic function was normal. - Right atrium: The atrium was normal in size. - Tricuspid valve: There was no regurgitation. - Pulmonary arteries: Systolic pressure was within the normal range. - Inferior vena cava: The vessel was normal in size. - Pericardium, extracardiac: There was no pericardial effusion.   ECG  Sinus, HR 94, TWI in V1-V3 ( QT/QTc 416/521 ms)  ASSESSMENT AND PLAN Sharon Cochran is a 61 y.o. female with a history of COPD, gastric ulcers, long term tobacco abuse and recently diagnosed CAD s/p CABG x1V who presented to [facility] ED today with chest pain.   Chest pain: troponin negative x1. ECG with TWI in V1-V3. Pain has a pleuritic component. With recent surgery, dyspnea and tachycardia, we need to rule out PE. This will also show [place] if she has a pericardial effusion. Continue to cycle enzymes but this does not seem cardiac in nature. Continue treating with pain meds for now.   Hypotension: she has been treated with IVFs.    Signed, Cline Crock, PA-C 06/17/2017, 1:57 PM  Pager [phone] As above, patient seen and examined. Briefly she is a 61 year old female with past medical history of COPD and coronary artery disease status post coronary artery bypass graft recently with chest pain. Patient underwent coronary artery bypass graft on June 6 by Dr. Dorris Fetch. She had a LIMA to the LAD. LV function normal. Patient has done reasonably well with recovery. However she awoke this morning with pain in the sternotomy area radiating to left upper chest. The pain increases with movements and inspiration. She states it is much more severe than her infarct pain. She has not been nauseated nor is there diaphoresis. She feels dyspneic because of inability to take a deep breath.  Electrocardiogram showed sinus rhythm, RV  conduction delay and T-wave inversion in V1 through V3. Troponin 0.00. White blood cell count 12.4. Chest x-ray showed mild subsegmental atelectasis.  1 chest pain-patient presents with chest pain that increases with certain movements and inspiration. She is somewhat tender to palpation near sternotomy. She is not complaining of fevers or chills. I will arrange a CTA to exclude pulmonary embolus and to rule out pericardial effusion. Symptoms seem most  consistent with musculoskeletal pain. I will notify Dr. Dorris Fetch as he is to evaluate her tomorrow in the office postoperatively. Blood pressure is soft. We will hold metoprolol. Note her symptoms are different than her infarct pain and her history does not appear to be consistent with ischemia. Would check follow-up troponin. If negative would not pursue further ischemia evaluation.   2 coronary artery disease status post coronary artery bypass graft-continue aspirin and statin.  3 COPD-management per primary care.  Olga Millers, MD

## 2017-06-17 NOTE — ED Notes (Signed)
Attempted report x1. 

## 2017-06-17 NOTE — ED Triage Notes (Signed)
Pt brought in by EMS due to having chest pressure that started at 7 this morning. Pt had CABG and NSTEMI in June. Per pt, pain is the same from last MI. Pt has received nitrox2, 324mg  of aspirin, and 600 of NS. Pt rating pain 10 out of 10. Pt a&ox4.

## 2017-06-17 NOTE — H&P (Signed)
History and Physical:    Sharon Cochran   BTC:[id number] DOB: 10/04/1956 DOA: 06/17/2017  Referring MD/provider: Courtney Paris, MD PCP: System, Pcp Not In   Patient coming from:   Chief Complaint: Chest pain  History of Present Illness:   Sharon Cochran is an 61 y.o. 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 Her postoperative course was uncomplicated. She was discharged on aspirin, Lipitor, metoprolol and a nicotine patch. She saw Dr. Stanford Breed on 06/06/17 for post hospital follow-up where she had a chest x-ray done which showed bilateral pleural effusions for which she was given several days of Lasix. She is scheduled to follow-up with her CVTS surgeon, Dr. Roxan Hockey, 06/18/17. This morning, while getting up to go to the bathroom, she had the sudden onset of 10/10 chest pain radiating to her left shoulder, jaw and neck with a somewhat pleuritic quality (worsened with deep breaths). She subsequently took 4 baby aspirins and called EMS who gave HER-2 nitroglycerin tablets. This did not decrease the intensity of her pain.  ED Course:  The patient was seen and evaluated by Dr. Stanford Breed who did not feel that the patient's pain was cardiac. Initial troponin negative. Continued to have pain despite a dose of fentanyl. Was given 1 mg of Dilaudid with improved pain. CT of the chest done urgently which showed interstitial edema but no pulmonary embolism.  ROS:   Review of Systems  Constitutional: Positive for fever and malaise/fatigue.  HENT: Negative.   Eyes: Negative.   Respiratory: Positive for shortness of breath. Negative for cough, hemoptysis, sputum production and wheezing.   Cardiovascular: Positive for chest pain. Negative for orthopnea, leg swelling and PND.  Gastrointestinal: Positive for nausea. Negative for blood in stool, constipation, diarrhea, melena and  vomiting.  Genitourinary: Negative.   Musculoskeletal: Positive for neck pain.  Skin: Negative.   Neurological: Positive for weakness.  Endo/Heme/Allergies: Negative.   Psychiatric/Behavioral: Negative.     Past Medical History:   Past Medical History:  Diagnosis Date  . COPD (chronic obstructive pulmonary disease) ([facility])   . Coronary artery disease   . GERD (gastroesophageal reflux disease)   . Non-STEMI (non-ST elevated myocardial infarction) ([place]) 05/20/2017    Past Surgical History:   Past Surgical History:  Procedure Laterality Date  . CORONARY ARTERY BYPASS GRAFT N/A 05/22/2017   Procedure: OFF PUMP CORONARY ARTERY BYPASS GRAFTING (CABG) x1 using left internal mammary artery;  Surgeon: Melrose Nakayama, MD;  Location: [facility];  Service: Open Heart Surgery;  Laterality: N/A;  . INTRAVASCULAR PRESSURE WIRE/FFR STUDY N/A 05/20/2017   Procedure: Intravascular Pressure Wire/FFR Study;  Surgeon: Nelva Bush, MD;  Location: [facility];  Service: Cardiovascular;  Laterality: N/A;  . LEFT HEART CATH AND CORONARY ANGIOGRAPHY N/A 05/20/2017   Procedure: Left Heart Cath and Coronary Angiography;  Surgeon: Nelva Bush, MD;  Location: [facility];  Service: Cardiovascular;  Laterality: N/A;  . TEE WITHOUT CARDIOVERSION N/A 05/22/2017   Procedure: TRANSESOPHAGEAL ECHOCARDIOGRAM (TEE);  Surgeon: Melrose Nakayama, MD;  Location: [facility];  Service: Open Heart Surgery;  Laterality: N/A;  . TUBAL LIGATION      Social History:   Social History   Social History  . Marital status: Married    Spouse name: N/A  . Number of children: N/A  . Years of education: N/A   Occupational History  . Not on file.  Social History Main Topics  . Smoking status: Former Smoker    Packs/day: 1.00    Types: Cigarettes  . Smokeless tobacco: Never Used  . Alcohol use 2.4 oz/week    4 Shots of liquor per week  . Drug use: No  . Sexual activity: Not on file   Other Topics Concern   . Not on file   Social History Narrative  . No narrative on file    Allergies   No known allergies  Family history:   Family History  Problem Relation Age of Onset  . Hypertension Mother     Current Medications:   Prior to Admission medications   Medication Sig Start Date End Date Taking? Authorizing Provider  albuterol (PROVENTIL) (2.5 MG/3ML) 0.083% nebulizer solution Take 2.5 mg by nebulization every 6 (six) hours as needed for wheezing or shortness of breath.   Yes [provider]  aspirin EC 325 MG EC tablet Take 1 tablet (325 mg total) by mouth daily. 05/27/17  Yes Lars Pinks M, PA-C  atorvastatin (LIPITOR) 80 MG tablet Take 1 tablet (80 mg total) by mouth daily at 6 PM. 05/26/17  Yes Lars Pinks M, PA-C  Fluticasone-Salmeterol (ADVAIR) 250-50 MCG/DOSE AEPB Inhale 1 puff into the lungs 2 (two) times daily.   Yes [provider]  metoprolol tartrate (LOPRESSOR) 25 MG tablet Take 0.5 tablets (12.5 mg total) by mouth 2 (two) times daily. 06/06/17  Yes Eileen Stanford, PA-C  metroNIDAZOLE (METROGEL) 1 % gel Apply 1 application topically 2 (two) times daily.    Yes [provider]  nicotine (NICODERM CQ - DOSED IN MG/24 HOURS) 14 mg/24hr patch Place 1 patch (14 mg total) onto the skin daily. 05/27/17  Yes Lars Pinks M, PA-C  omeprazole (PRILOSEC) 20 MG capsule Take 20 mg by mouth daily.   Yes [provider]  venlafaxine XR (EFFEXOR-XR) 75 MG 24 hr capsule Take 75 mg by mouth daily with breakfast.   Yes [provider]  albuterol (PROVENTIL HFA;VENTOLIN HFA) 108 (90 Base) MCG/ACT inhaler Inhale 1-2 puffs into the lungs every 6 (six) hours as needed for wheezing or shortness of breath.    [provider]  furosemide (LASIX) 20 MG tablet Take 1 tablet (20 mg total) by mouth daily. Patient not taking: Reported on 06/17/2017 06/06/17 09/04/17  Eileen Stanford, PA-C  oxyCODONE (OXY IR/ROXICODONE) 5 MG  immediate release tablet Take 5 mg by mouth every 4-6 hours PRN severe pain. Patient not taking: Reported on 06/17/2017 05/26/17   Nani Skillern, PA-C  promethazine (PHENERGAN) 12.5 MG tablet Take 1 tablet (12.5 mg total) by mouth every 6 (six) hours as needed for nausea or vomiting. Patient not taking: Reported on 06/17/2017 05/27/17   Melrose Nakayama, MD    Physical Exam:   Vitals:   06/17/17 1330 06/17/17 1345 06/17/17 1415 06/17/17 1430  BP: 98/60 96/60 100/65 102/62  Pulse: (!) 102 (!) 103 (!) 104 (!) 103  Resp: (!) 24 19 (!) 23 (!) 23  Temp:      TempSrc:      SpO2: 96% 92% 96% 95%  Weight:      Height:         Physical Exam: Blood pressure 102/62, pulse (!) 103, temperature 98.5 F (36.9 C), temperature source Oral, resp. rate (!) 23, height 5' (1.524 m), weight 70.8 kg (156 lb), SpO2 95 %. Gen: Appears distressed secondary to significant pain. Flushed in the face and glassy eyed. Head: Normocephalic,  atraumatic. Eyes: Pupils equal, round and reactive to light. Extraocular movements intact.  Sclerae nonicteric. No lid lag. Glassy eyes. Mouth: Oropharynx reveals moist mucous membranes. Dentition is good. Neck: Supple, no thyromegaly, no lymphadenopathy, no jugular venous distention. Chest: Lungs are clear to auscultation, decreased in the bases. Rales on the right. CV: Heart sounds are regular/tachycardic with an S1, S2. No murmurs, rubs, clicks, or gallops.  Abdomen: Soft, nontender, nondistended with normal active bowel sounds. No hepatosplenomegaly or palpable masses. Extremities: Extremities are without clubbing, or cyanosis. No edema. Pedal pulses 2+.  Skin: Warm and dry. No rashes, lesions or wounds. Well-healed sternal incision site. Neuro: Alert and oriented times 3; grossly nonfocal.  Psych: Insight is good and judgment is appropriate. Mood and affect anxious.   Data Review:    Labs: Reviewed. Chemistries unremarkable except for mildly elevated  glucose. Creatinine 0.57. WBC mildly elevated at 12.4. Hemoglobin stable at 13.9. Troponin 0.00.    Radiographic Studies: Chest x-ray shows evidence of recent sternotomy, stable heart size, no evidence of pneumonia. Mild bibasilar atelectasis.   EKG: Independently reviewed. Sinus rhythm with PVCs and a parent conduction, abnormal R-wave progression, nonspecific T-wave abnormalities with inversions in V2, V3. QTC 521 ms.   Assessment/Plan:   Principal Problem:   Chest pain and fever in a patient status post recent CABG/history of non-ST elevation MI Initial troponin reassuring. No acute MI on EKG. Pulmonary embolism ruled out with CT of the chest. Patient has a low-grade fever, mild leukocytosis, and was initially hypotensive. Check lactic acid. Pain is sharp and somewhat pleuritic. Pleurisy or sternal wound infection is in the differential. Will obtain 2 sets of blood cultures and empirically place the patient on Rocephin. This will cover staph (MSSA) and strep. We'll treat pain symptomatically with Dilaudid for now and place the patient on ATC Toradol for anti-inflammatory properties. Dr. Roxan Hockey will evaluate patient tomorrow. Continue aspirin, statin, and beta blocker.  Active Problems:   Interstitial edema Possibly from fluid volume shift status post recent surgery. Will give a dose of IV Lasix.    GERD (gastroesophageal reflux disease) Stable. Continue PPI therapy.    Smoker Nicotine patch ordered.    COPD (chronic obstructive pulmonary disease) (H[facility]) No active wheezing on exam. Stable. Continue Dulera, and when necessary bronchodilators.   HIV screening The patient falls between the ages of 13-64 and should be screened for HIV, therefore HIV testing ordered.  Body mass index is 30.47 kg/m.  Other information:   DVT prophylaxis: Lovenox ordered. Code Status: Full code. Family Communication: Husband at the bedside.  Disposition Plan: Home. Consults called: Dr.  Stanford Breed, cardiology. Admission status: Observation.   The medical decision making on this patient was of high complexity and the patient is at high risk for clinical deterioration, therefore this is a level 3 visit.   Eunice Oldaker [facility] Pager [phone] Cell: [phone]   If 7PM-7AM, please contact night-coverage [email or web address] Password [email or web address] 06/17/2017, 3:25 PM

## 2017-06-18 ENCOUNTER — Ambulatory Visit: Payer: Self-pay | Admitting: Thoracic Surgery (Cardiothoracic Vascular Surgery)

## 2017-06-18 DIAGNOSIS — Z716 Tobacco abuse counseling: Secondary | ICD-10-CM | POA: Diagnosis not present

## 2017-06-18 DIAGNOSIS — J449 Chronic obstructive pulmonary disease, unspecified: Secondary | ICD-10-CM | POA: Diagnosis not present

## 2017-06-18 DIAGNOSIS — J410 Simple chronic bronchitis: Secondary | ICD-10-CM | POA: Diagnosis not present

## 2017-06-18 DIAGNOSIS — R071 Chest pain on breathing: Secondary | ICD-10-CM | POA: Diagnosis not present

## 2017-06-18 DIAGNOSIS — I2510 Atherosclerotic heart disease of native coronary artery without angina pectoris: Secondary | ICD-10-CM | POA: Diagnosis not present

## 2017-06-18 DIAGNOSIS — R079 Chest pain, unspecified: Secondary | ICD-10-CM | POA: Diagnosis not present

## 2017-06-18 DIAGNOSIS — Z951 Presence of aortocoronary bypass graft: Secondary | ICD-10-CM | POA: Diagnosis not present

## 2017-06-18 LAB — TROPONIN I: Troponin I: <0.03 ng/mL (ref <0.03)

## 2017-06-18 LAB — CBC WITH DIFFERENTIAL/PLATELET
Basophils Absolute: 0 10*3/uL (ref 0.0–0.1)
Basophils Relative: 0 %
Eosinophils Absolute: 0.1 10*3/uL (ref 0.0–0.7)
Eosinophils Relative: 1 %
HEMATOCRIT: 39.6 % (ref 36.0–46.0)
HEMOGLOBIN: 12.7 g/dL (ref 12.0–15.0)
Lymphocytes Relative: 27 %
Lymphs Abs: 2.3 10*3/uL (ref 0.7–4.0)
MCH: 30.9 pg (ref 26.0–34.0)
MCHC: 32.1 g/dL (ref 30.0–36.0)
MCV: 96.4 fL (ref 78.0–100.0)
Monocytes Absolute: 1.1 10*3/uL — ABNORMAL HIGH (ref 0.1–1.0)
Monocytes Relative: 13 %
NEUTROS ABS: 5 10*3/uL (ref 1.7–7.7)
NEUTROS PCT: 59 %
Platelets: 247 10*3/uL (ref 150–400)
RBC: 4.11 MIL/uL (ref 3.87–5.11)
RDW: 13.8 % (ref 11.5–15.5)
WBC: 8.5 10*3/uL (ref 4.0–10.5)

## 2017-06-18 LAB — PROCALCITONIN

## 2017-06-18 LAB — LACTATE DEHYDROGENASE: LDH: 129 U/L (ref 98–192)

## 2017-06-18 MED ORDER — PANTOPRAZOLE SODIUM 40 MG PO TBEC
40.0000 mg | DELAYED_RELEASE_TABLET | Freq: Two times a day (BID) | ORAL | Status: DC
  Administered 2017-06-18 – 2017-06-19 (×2): 40 mg via ORAL
  Filled 2017-06-18 (×2): qty 1

## 2017-06-18 MED ORDER — NIFEDIPINE ER OSMOTIC RELEASE 30 MG PO TB24
30.0000 mg | ORAL_TABLET | Freq: Every day | ORAL | Status: DC
  Administered 2017-06-18 – 2017-06-19 (×2): 30 mg via ORAL
  Filled 2017-06-18 (×2): qty 1

## 2017-06-18 MED ORDER — PREDNISONE 20 MG PO TABS
40.0000 mg | ORAL_TABLET | Freq: Every day | ORAL | Status: DC
  Administered 2017-06-19: 40 mg via ORAL
  Filled 2017-06-18: qty 2

## 2017-06-18 MED ORDER — METHYLPREDNISOLONE SODIUM SUCC 125 MG IJ SOLR
125.0000 mg | Freq: Once | INTRAMUSCULAR | Status: AC
  Administered 2017-06-18: 125 mg via INTRAVENOUS
  Filled 2017-06-18: qty 2

## 2017-06-18 NOTE — Progress Notes (Signed)
PROGRESS NOTE        PATIENT DETAILS Name: Sharon Cochran Age: 61 y.o. Sex: female Date of Birth: [birth year] Admit Date: 06/17/2017 Admitting Physician Maryruth Bun Rama, MD [id number], Pcp Not In  Brief Narrative: Patient is a 61 y.o. female with past medical history significant for tobacco abuse, PUD, COPD, CAD with non STEMI s/p CABG on 05/22/2017. She presented to the ED yesterday morning with acute onset of chest pain, described "worse than when I had my heart attack". The pain radiated to her left shoulder and into her neck. She was brought in by by EMS after she had taken 4 baby Asprin and was given 2 NTG but pain was not relieved. Serial troponin's were negative and EKG did not show evidence of acute MI. She was given fentanyl and dilaudid in the ED and dilaudid relieved the pain. CT showed mild interstitial pulmonary edema and small pleural effusions on the left and mild to moderate right lower lobe atelectasis. Patient had mild leukocytosis and a lactate of 2.1. She was admitted for further workup and management.  Subjective: Patient laying in bed, appears uncomfortable but in no acute distress. States she is still having pain and Dilaudid is the only medication that has helped. States the pain is worse with deep breathing and movement. She had 1 episode of irregular heart beats yesterday and also experiencing SOB associated with pain. She denies cough, abdominal pain, dysuria, nausea, vomiting.   Assessment/Plan: Chest pain: Cardiology workup negative. No evidence of PE or PNA on XR or CT. Patient continues to be in a lot of pain. Suspect symptoms are musculoskeletal related and inflammatory mediated, possibly due to recent CABG.  - Blood cultures pending - Discontinue dilaudid, start morphine - Continue oxycodone and toradol - Start solumedrol - No evidence of PNA, pro calcitonin negative, afebrile and leukocytosis resolved. Stop ceftriaxone.  GERD:  continue protonix  Tobacco abuse: Continue nicotine patch  COPD: Without exacerbation, continue albuterol and dulera.  N-STEMI s/p CABG: Continue BB and ASA  DVT Prophylaxis: Prophylactic Lovenox  Code Status: Full code Family Communication: Spouse at bedside Disposition Plan: Home when clinically improved  Antimicrobial agents: Anti-infectives    Start     Dose/Rate Route Frequency Ordered Stop   06/17/17 2000  cefTRIAXone (ROCEPHIN) 1 g in dextrose 5 % 50 mL IVPB     1 g 100 mL/hr over 30 Minutes Intravenous Every 24 hours 06/17/17 1842        Procedures: None  CONSULTS:  cardiology  Time spent: 25 minutes-Greater than 50% of this time was spent in counseling, explanation of diagnosis, planning of further management, and coordination of care.  MEDICATIONS: Scheduled Meds: . aspirin  325 mg Oral Daily  . atorvastatin  80 mg Oral q1800  . enoxaparin (LOVENOX) injection  40 mg Subcutaneous Q24H  . ketorolac  30 mg Intravenous Q6H  . metoprolol tartrate  12.5 mg Oral BID  . mometasone-formoterol  2 puff Inhalation BID  . nicotine  14 mg Transdermal Daily  . NIFEdipine  30 mg Oral Daily  . pantoprazole  40 mg Oral BID  . sodium chloride flush  3 mL Intravenous Q12H  . venlafaxine XR  75 mg Oral Q breakfast   Continuous Infusions: . sodium chloride    . cefTRIAXone (ROCEPHIN)  IV Stopped (06/17/17 2056)   PRN Meds:.sodium chloride,  acetaminophen **OR** acetaminophen, albuterol, HYDROmorphone (DILAUDID) injection, ondansetron **OR** ondansetron (ZOFRAN) IV, oxyCODONE, polyethylene glycol, sodium chloride flush   PHYSICAL EXAM: Vital signs: Vitals:   06/18/17 0526 06/18/17 0527 06/18/17 0543 06/18/17 0545  BP: 104/69     Pulse: 82 83 81   Resp: (!) 25 20 16    Temp: 98.2 F (36.8 C)     TempSrc: Oral     SpO2: 93% (!) 89% 94%   Weight:    70.5 kg (155 lb 6.4 oz)  Height:       Filed Weights   06/17/17 1109 06/18/17 0545  Weight: 70.8 kg (156 lb) 70.5 kg  (155 lb 6.4 oz)   Body mass index is 30.35 kg/m.   General appearance: Awake, alert, not in distress. Non toxic appearing Resp: Upper lobes CTA, no added sounds or accessory muscle use. CVS: RRR, no murmurs. TTP over left chest. No rash. GI: Bowel sounds present in all 4 quadrants, non-tender and without guarding Extremities: B/L Lower Ext shows no edema, both legs are warm to touch Neurology: speech clear, No focal neuro deficits Psychiatric: Normal judgment and insight. Alert and oriented x 3. Normal mood.  I have personally reviewed following labs and imaging studies  LABORATORY DATA: CBC:  Recent Labs Lab 06/17/17 1112 06/17/17 1902 06/18/17 0731  WBC 12.4* 12.5* 8.5  NEUTROABS  --   --  5.0  HGB 13.9 13.9 12.7  HCT 42.1 43.2 39.6  MCV 95.0 94.3 96.4  PLT 254 278 247    Basic Metabolic Panel:  Recent Labs Lab 06/17/17 1112 06/17/17 1902  NA 137  --   K 4.0  --   CL 104  --   CO2 22  --   GLUCOSE 119*  --   BUN 16  --   CREATININE 0.57 0.57  CALCIUM 9.0  --     GFR: Estimated Creatinine Clearance: 66.3 mL/min (by C-G formula based on SCr of 0.57 mg/dL).  Liver Function Tests: No results for input(s): AST, ALT, ALKPHOS, BILITOT, PROT, ALBUMIN in the last 168 hours. No results for input(s): LIPASE, AMYLASE in the last 168 hours. No results for input(s): AMMONIA in the last 168 hours.  Coagulation Profile: No results for input(s): INR, PROTIME in the last 168 hours.  Cardiac Enzymes:  Recent Labs Lab 06/17/17 1902 06/18/17 0010 06/18/17 0612  TROPONINI <0.03 <0.03 <0.03    BNP (last 3 results) No results for input(s): PROBNP in the last 8760 hours.  HbA1C: No results for input(s): HGBA1C in the last 72 hours.  CBG: No results for input(s): GLUCAP in the last 168 hours.  Lipid Profile: No results for input(s): CHOL, HDL, LDLCALC, TRIG, CHOLHDL, LDLDIRECT in the last 72 hours.  Thyroid Function Tests: No results for input(s): TSH,  T4TOTAL, FREET4, T3FREE, THYROIDAB in the last 72 hours.  Anemia Panel: No results for input(s): VITAMINB12, FOLATE, FERRITIN, TIBC, IRON, RETICCTPCT in the last 72 hours.  Urine analysis:    Component Value Date/Time   COLORURINE STRAW (A) 06/17/2017 2042   APPEARANCEUR CLEAR 06/17/2017 2042   LABSPEC 1.017 06/17/2017 2042   PHURINE 5.0 06/17/2017 2042   GLUCOSEU NEGATIVE 06/17/2017 2042   HGBUR SMALL (A) 06/17/2017 2042   BILIRUBINUR NEGATIVE 06/17/2017 2042   KETONESUR NEGATIVE 06/17/2017 2042   PROTEINUR NEGATIVE 06/17/2017 2042   NITRITE NEGATIVE 06/17/2017 2042   LEUKOCYTESUR NEGATIVE 06/17/2017 2042    Sepsis Labs: Lactic Acid, Venous    Component Value Date/Time   LATICACIDVEN 2.1 (  HH) 06/17/2017 1653    MICROBIOLOGY: Recent Results (from the past 240 hour(s))  MRSA PCR Screening     Status: None   Collection Time: 06/17/17  6:43 PM  Result Value Ref Range Status   MRSA by PCR NEGATIVE NEGATIVE Final    Comment:        The GeneXpert MRSA Assay (FDA approved for NASAL specimens only), is one component of a comprehensive MRSA colonization surveillance program. It is not intended to diagnose MRSA infection nor to guide or monitor treatment for MRSA infections.     RADIOLOGY STUDIES/RESULTS: Dg Chest 2 View  Result Date: 06/06/2017 CLINICAL DATA:  CABG. EXAM: CHEST  2 VIEW COMPARISON:  05/25/2017 . FINDINGS: Prior median sternotomy and CABG. Heart size normal. Low lung volumes with mild basilar atelectasis in small pleural effusions. Improved from prior exam. No pneumothorax. IMPRESSION: 1. Low lung volumes with mild basilar atelectasis and pleural effusions. Improved from prior exam. 2.  Prior median sternotomy.  Heart size normal. Electronically Signed   By: Maisie Fus  Register   On: 06/06/2017 10:49   Dg Chest 2 View  Result Date: 05/25/2017 CLINICAL DATA:  Status post coronary artery bypass grafting. Pleural effusions. EXAM: CHEST  2 VIEW COMPARISON:  May 24, 2017. FINDINGS: Central catheter has been removed without evident pneumothorax. 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 No pneumothorax. There are persistent pleural effusions bilaterally with bilateral airspace consolidation. There is also atelectatic change in the left lower lobe. Heart is upper normal in size with pulmonary vascularity within normal limits. No evident adenopathy. No bone lesions. Patient is status post median sternotomy. IMPRESSION: No pneumothorax. Persistent pleural effusions bilaterally with patchy bibasilar airspace consolidation. Linear atelectasis left lower lobe. Stable cardiac silhouette. The consolidation in the lung bases may well represent a degree of pneumonia, although at least some of this consolidation is felt to be due to atelectatic change. Electronically Signed   By: Bretta Bang III M.D.   On: 05/25/2017 07:29   Dg Chest 2 View  Result Date: 05/19/2017 CLINICAL DATA:  Epigastric abdominal pain beginning 2 days ago. History of COPD. EXAM: CHEST  2 VIEW COMPARISON:  None. FINDINGS: Normal cardiac silhouette and mediastinal contours. The lungs are hyperexpanded with flattening of the bilateral hemidiaphragms. No focal parenchymal opacities. No pleural effusion or pneumothorax. No evidence of edema. No acute osseous abnormalities. IMPRESSION: Hyperexpanded lungs without acute cardiopulmonary disease. Specifically, no focal airspace opacities to suggest pneumonia. Electronically Signed   By: Simonne Come M.D.   On: 05/19/2017 12:51   Ct Chest W Contrast  Result Date: 05/19/2017 CLINICAL DATA:  Epigastric pain 72 hours after endoscopy. EXAM: CT CHEST WITHOUT AND WITH CONTRAST TECHNIQUE: Protocol was requested by GI. A without chest CT was obtained. Subsequently Multidetector CT imaging of the chest was performed during intravenous and oral contrast administration. CONTRAST:  75mL ISOVUE-300 IOPAMIDOL (ISOVUE-300) INJECTION 61%IV COMPARISON:  None. FINDINGS:  Cardiovascular: Normal heart size. No pericardial effusion. No evidence of acute vascular disease. Mild atherosclerotic calcifications seen along the distal brachiocephalic. Mediastinum/Nodes: No pneumomediastinum, esophageal thickening, or extravasation of the oral contrast which is seen throughout the esophagus. Negative for adenopathy or swelling. Lungs/Pleura: Negative for pneumonia or air leak. Upper Abdomen: The stomach was covered and is negative for wall thickening. No epigastric pneumoperitoneum. Musculoskeletal: No acute or aggressive finding. Probable dermal inclusion cyst in the subcutaneous right paramedian lower back. IMPRESSION: No acute finding. There is no pneumomediastinum or extravasation of esophageal contrast. The stomach  was visualized and negative. Electronically Signed   By: Marnee Spring M.D.   On: 05/19/2017 20:40   Ct Angio Chest Pe W Or Wo Contrast  Result Date: 06/17/2017 CLINICAL DATA:  61 year old female with acute chest pain radiating to left shoulder neck and face. Patient with CABG on 05/22/2017. EXAM: CT ANGIOGRAPHY CHEST WITH CONTRAST TECHNIQUE: Multidetector CT imaging of the chest was performed using the standard protocol during bolus administration of intravenous contrast. Multiplanar CT image reconstructions and MIPs were obtained to evaluate the vascular anatomy. CONTRAST:  100 cc intravenous Isovue 370 COMPARISON:  05/19/2017 chest CT.  06/17/2017 and prior radiographs FINDINGS: Cardiovascular: This is a technically adequate study but respiratory motion artifact limits sensitivity in several portions of the lungs, greatest in the lower lobes. No pulmonary emboli are identified. There is no evidence of thoracic aortic aneurysm. Recent CABG changes identified. Small amount of pericardial fluid noted. Mediastinum/Nodes: No mediastinal hematoma. No enlarged mediastinal, hilar, or axillary lymph nodes. Thyroid gland, trachea, and esophagus demonstrate no significant findings.  Lungs/Pleura: A small left pleural effusion and bilateral lower lobe atelectasis, moderate on the left and mild to moderate on the right. Mild central ground-glass opacities may represent mild interstitial edema. No definite airspace disease noted. There is no evidence of pneumothorax. Upper Abdomen: No acute abnormality Musculoskeletal: No acute abnormality or suspicious lesions. Patient is status post median sternotomy. Review of the MIP images confirms the above findings. IMPRESSION: No evidence of pulmonary emboli or thoracic aortic aneurysm. Mild central ground-glass opacities which may represent mild interstitial pulmonary edema. Status post CABG with small left pleural effusion, moderate left lower lobe and mild to moderate right lower lobe atelectasis. Electronically Signed   By: Harmon Pier M.D.   On: 06/17/2017 15:35   Dg Chest Portable 1 View  Result Date: 06/17/2017 CLINICAL DATA:  Pain. EXAM: PORTABLE CHEST 1 VIEW COMPARISON:  06/06/2017 . FINDINGS: Prior CABG. Heart size stable. Mild bibasilar subsegmental atelectasis and/or scarring again noted. No pleural effusion or pneumothorax . IMPRESSION: 1.  Prior CABG.  Heart size stable. 2. Mild bibasilar subsegmental atelectasis and/or scarring. Electronically Signed   By: Maisie Fus  Register   On: 06/17/2017 11:39   Dg Chest Port 1 View  Result Date: 05/24/2017 CLINICAL DATA:  Status post coronary bypass grafting EXAM: PORTABLE CHEST 1 VIEW COMPARISON:  05/23/2017 FINDINGS: Cardiac shadow is mildly enlarged. Postsurgical changes are again seen. Right jugular central line is noted. The thoracostomy catheters have been removed in the interval. No pneumothorax is noted. Bilateral small pleural effusions are again seen slightly increased from the prior exam. No new focal abnormality is noted. IMPRESSION: Interval removal of bilateral chest tubes. Slight increase in bilateral pleural effusions. Electronically Signed   By: Alcide Clever M.D.   On: 05/24/2017  07:36   Dg Chest Port 1 View  Result Date: 05/23/2017 CLINICAL DATA:  Chest tube. EXAM: PORTABLE CHEST 1 VIEW COMPARISON:  05/22/2017, 05/19/2017.  CT 05/19/2017. FINDINGS: Interim removal of endotracheal tube and NG tube. Right IJ line and bilateral chest tubes in stable position . Prior CABG. Heart size stable. Low lung volumes with persistent bibasilar infiltrates and/or edema. Persistent small pleural effusions. No pneumothorax. IMPRESSION: 1. An removal of endotracheal tube and NG tube. Right IJ line and bilateral chest tubes in stable position. No pneumothorax. 2. Prior CABG. Heart size stable. Low lung volumes. Persistent bibasilar infiltrates and or edema and small bilateral pleural effusions. Electronically Signed   By: Maisie Fus  Register   On:  05/23/2017 07:27   Dg Chest Port 1 View  Result Date: 05/22/2017 CLINICAL DATA:  Status post CABG. EXAM: PORTABLE CHEST 1 VIEW COMPARISON:  05/19/2017 FINDINGS: Endotracheal tube terminates 4.6 cm above carina.Right internal jugular line tip at mid SVC. Nasogastric tube extends beyond the inferior aspect of the film. Left chest tube and mediastinal drain versus right chest tube. Midline trachea. Cardiomegaly accentuated by AP portable technique. Small bilateral pleural effusions. No pneumothorax. Mild pulmonary venous congestion, accentuated by low lung volumes. Left greater than right base airspace disease. IMPRESSION: Expected appearance after median sternotomy, with small bilateral pleural effusions and bibasilar airspace disease. Cardiomegaly and mild pulmonary venous congestion. Electronically Signed   By: Jeronimo Greaves M.D.   On: 05/22/2017 13:30     LOS: 0 days   Willa Frater, PA-S  [facility]  Attending MD note  Patient was seen, examined,treatment plan was discussed with the PA-S Willa Frater.  I have personally reviewed the clinical findings, lab, imaging studies and management of this patient in detail. I agree with the  documentation, as recorded by the PA-S and changes to above note were in bold green  Patient is 61 year old female with past medical history significant for COPD, CAD with non-STEMI status post CABG and 05/22/2017. Presented to the emergency department complaining of acute onset of chest pain described the worse on her life. Patient was evaluated for ACS which was negative. Patient was admitted for further evaluation and cardiothoracic surgery consult.  On Exam: Gen. exam: Awake, alert, not in any distress Chest: Good air entry bilaterally, no rhonchi or rales CVS: S1-S2 regular, no murmurs, chest wall tenderness Abdomen: Soft, nontender and nondistended Neurology: Non-focal Skin: No rash or lesions  A&P  Atypical chest pain - ACS rule it out, unlikely to be pericarditis without any EKG changes, normal troponins and normal CT with no rub on exam, PE rule out by CTA. Left in the differential include esophageal spasm although unlikely as pain is constant and is not related to swallowing, musculoskeletal pain, improper bone healing from CABG (? If patient was following recommendation status post CABG) I agree with Toradol around-the-clock for anti-inflammatory purposes Given Solu-Medrol 125 plan to continue prednisone 40 mg by mouth daily Cardiology recommendations appreciated Cardiothoracic recommendations appreciated - increase Protonix to twice a day and low-dose CCB Discontinue ceftriaxone as there is no clear source of infection and pro-calcitonin negative.  GERD Continue Protonix as ordered  Tobacco abuse Tobacco cessation discussed Continue nicotine patch  COPD with no acute exacerbation Continue home medication  History of N STEMI status post CABG Continue home medications.  Rest as above  Latrelle Dodrill, MD   If 7PM-7AM, please contact night-coverage [email or web address] Password [email or web address] 06/18/2017, 10:31 AM

## 2017-06-18 NOTE — Progress Notes (Signed)
Progress Note  Patient Name: Sharon Cochran Date of Encounter: 06/18/2017  Primary Cardiologist: Dr Katrinka BlazingSmith [place]  Subjective   Continues with CP but improved; mild dyspnea.  Inpatient Medications    Scheduled Meds: . aspirin  325 mg Oral Daily  . atorvastatin  80 mg Oral q1800  . enoxaparin (LOVENOX) injection  40 mg Subcutaneous Q24H  . ketorolac  30 mg Intravenous Q6H  . metoprolol tartrate  12.5 mg Oral BID  . mometasone-formoterol  2 puff Inhalation BID  . nicotine  14 mg Transdermal Daily  . pantoprazole  40 mg Oral Daily  . sodium chloride flush  3 mL Intravenous Q12H  . venlafaxine XR  75 mg Oral Q breakfast   Continuous Infusions: . sodium chloride    . cefTRIAXone (ROCEPHIN)  IV Stopped (06/17/17 2056)   PRN Meds: sodium chloride, acetaminophen **OR** acetaminophen, albuterol, HYDROmorphone (DILAUDID) injection, ondansetron **OR** ondansetron (ZOFRAN) IV, oxyCODONE, polyethylene glycol, sodium chloride flush   Vital Signs    Vitals:   06/18/17 0526 06/18/17 0527 06/18/17 0543 06/18/17 0545  BP: 104/69     Pulse: 82 83 81   Resp: (!) 25 20 16    Temp: 98.2 F (36.8 C)     TempSrc: Oral     SpO2: 93% (!) 89% 94%   Weight:    70.5 kg (155 lb 6.4 oz)  Height:        Intake/Output Summary (Last 24 hours) at 06/18/17 0852 Last data filed at 06/17/17 2056  Gross per 24 hour  Intake             1790 ml  Output             1300 ml  Net              490 ml   Filed Weights   06/17/17 1109 06/18/17 0545  Weight: 70.8 kg (156 lb) 70.5 kg (155 lb 6.4 oz)    Telemetry    Sinus - Personally Reviewed   Physical Exam   GEN: No acute distress.   Neck: No JVD, supple Cardiac: RRR, no murmurs, rubs, or gallops. S/P sternotomy; no evidence of infection Respiratory: Diminished BS bases GI: Soft, nontender, non-distended  MS: No edema Neuro:  Nonfocal  Psych: Normal affect   Labs    Chemistry Recent Labs Lab 06/17/17 1112  06/17/17 1902  NA 137  --   K 4.0  --   CL 104  --   CO2 22  --   GLUCOSE 119*  --   BUN 16  --   CREATININE 0.57 0.57  CALCIUM 9.0  --   GFRNONAA >60 >60  GFRAA >60 >60  ANIONGAP 11  --      Hematology Recent Labs Lab 06/17/17 1112 06/17/17 1902 06/18/17 0731  WBC 12.4* 12.5* 8.5  RBC 4.43 4.58 4.11  HGB 13.9 13.9 12.7  HCT 42.1 43.2 39.6  MCV 95.0 94.3 96.4  MCH 31.4 30.3 30.9  MCHC 33.0 32.2 32.1  RDW 13.5 13.6 13.8  PLT 254 278 247    Cardiac Enzymes Recent Labs Lab 06/17/17 1902 06/18/17 0010 06/18/17 0612  TROPONINI <0.03 <0.03 <0.03    Recent Labs Lab 06/17/17 1121  TROPIPOC 0.00     BNP Recent Labs Lab 06/17/17 1653  BNP 60.7      Radiology    Ct Angio Chest Pe W Or Wo Contrast  Result Date: 06/17/2017 CLINICAL DATA:  61 year old female  with acute chest pain radiating to left shoulder neck and face. Patient with CABG on 05/22/2017. EXAM: CT ANGIOGRAPHY CHEST WITH CONTRAST TECHNIQUE: Multidetector CT imaging of the chest was performed using the standard protocol during bolus administration of intravenous contrast. Multiplanar CT image reconstructions and MIPs were obtained to evaluate the vascular anatomy. CONTRAST:  100 cc intravenous Isovue 370 COMPARISON:  05/19/2017 chest CT.  06/17/2017 and prior radiographs FINDINGS: Cardiovascular: This is a technically adequate study but respiratory motion artifact limits sensitivity in several portions of the lungs, greatest in the lower lobes. No pulmonary emboli are identified. There is no evidence of thoracic aortic aneurysm. Recent CABG changes identified. Small amount of pericardial fluid noted. Mediastinum/Nodes: No mediastinal hematoma. No enlarged mediastinal, hilar, or axillary lymph nodes. Thyroid gland, trachea, and esophagus demonstrate no significant findings. Lungs/Pleura: A small left pleural effusion and bilateral lower lobe atelectasis, moderate on the left and mild to moderate on the right. Mild  central ground-glass opacities may represent mild interstitial edema. No definite airspace disease noted. There is no evidence of pneumothorax. Upper Abdomen: No acute abnormality Musculoskeletal: No acute abnormality or suspicious lesions. Patient is status post median sternotomy. Review of the MIP images confirms the above findings. IMPRESSION: No evidence of pulmonary emboli or thoracic aortic aneurysm. Mild central ground-glass opacities which may represent mild interstitial pulmonary edema. Status post CABG with small left pleural effusion, moderate left lower lobe and mild to moderate right lower lobe atelectasis. Electronically Signed   By: Harmon Pier M.D.   On: 06/17/2017 15:35   Dg Chest Portable 1 View  Result Date: 06/17/2017 CLINICAL DATA:  Pain. EXAM: PORTABLE CHEST 1 VIEW COMPARISON:  06/06/2017 . FINDINGS: Prior CABG. Heart size stable. Mild bibasilar subsegmental atelectasis and/or scarring again noted. No pleural effusion or pneumothorax . IMPRESSION: 1.  Prior CABG.  Heart size stable. 2. Mild bibasilar subsegmental atelectasis and/or scarring. Electronically Signed   By: Maisie Fus  Register   On: 06/17/2017 11:39     Patient Profile     61 y.o. female status post recent coronary artery bypass and graft on June 6 admitted with recurrent chest pain.  Assessment & Plan    1 chest pain-Patient continues with chest pain that increases with inspiration and certain movements; however overall improved. Enzymes are negative. CTA showed no pulmonary embolus. There is no pericardial effusion noted on CT scan. Would treat for musculoskeletal pain. Sternotomy stable and does not appear to be infected. Follow-up blood cultures.  2 coronary artery disease status post coronary artery bypass graft-continue aspirin and statin. Resume metoprolol at discharge. She will need close follow-up in [place] and she has established with a cardiologist there by her report.  3 COPD-management per  primary care.  We will sign off; please call with questions.  Signed, Olga Millers, MD  06/18/2017, 8:52 AM

## 2017-06-18 NOTE — Progress Notes (Signed)
      [address]       [place] [id number]             [phone]      Mrs. Brault was readmitted yesterday with sudden onset of severe stabbing chest pain.  She was admitted in June with unstable CP and ruled in for a non STEMI with a mild troponin elevation. She had an ostial LAD stenosis. The initial plan was to treat medically but she had persistent pain. Her pain seemed out of proportion to the severity of her disease but we could not rule out a cardiac source and decided CABG with a LIMA to the LAD was the safest option.  She did well postop.  Yesterday AM had sudden onset of chest pain with no inciting event. She had not been having much incisional pain. Her pain still persists today, but is better with pain medication. She was started on empiric antibiotics without a clear source of infection.  On exam she is anxious but in NAD Cardiac regular, no rub Lungs minimally decreased in bases Sternal incision clean dry and intact and sternum is stable  She ruled out for MI by enzymes. A CT chest showed no evidence of a PE. There was no pericardial effusion, but there was a small left pleural effusion.  Chest pain of uncertain etiology. Differential includes coronary spasm, esophageal spasm, musculoskeletal pain, pericarditis, PE(ruled out by CT). I doubt this is cardiac in origin. I also doubt that it is pericarditis as there is no effusion or rub to speak of and I would expect that given her pain severity. There may be a musculoskeletal component but she was not having much pain prior to the acute onset which was not associated with any movement. I think it could be esophageal spasm as she has a history of reflux in the past.  A short course of steroids has little downside. Will add a low dose calcium channel blocker and increase her protonix to BID  Viviann SpareSteven C. Dorris FetchHendrickson, MD [facility] [phone]

## 2017-06-18 NOTE — Plan of Care (Signed)
Problem: Safety: Goal: Ability to remain free from injury will improve Outcome: Progressing Patient verbalized understanding of calling for assistance when attempting to ambulate.  Patient demonstrated correct use of call bell when needing assistance.  Pt progressing towards goal.

## 2017-06-19 DIAGNOSIS — F17200 Nicotine dependence, unspecified, uncomplicated: Secondary | ICD-10-CM

## 2017-06-19 DIAGNOSIS — I252 Old myocardial infarction: Secondary | ICD-10-CM

## 2017-06-19 DIAGNOSIS — R071 Chest pain on breathing: Secondary | ICD-10-CM

## 2017-06-19 DIAGNOSIS — K219 Gastro-esophageal reflux disease without esophagitis: Secondary | ICD-10-CM | POA: Diagnosis not present

## 2017-06-19 DIAGNOSIS — Z951 Presence of aortocoronary bypass graft: Secondary | ICD-10-CM | POA: Diagnosis not present

## 2017-06-19 DIAGNOSIS — J410 Simple chronic bronchitis: Secondary | ICD-10-CM | POA: Diagnosis not present

## 2017-06-19 DIAGNOSIS — Z87891 Personal history of nicotine dependence: Secondary | ICD-10-CM | POA: Diagnosis not present

## 2017-06-19 DIAGNOSIS — I2510 Atherosclerotic heart disease of native coronary artery without angina pectoris: Secondary | ICD-10-CM | POA: Diagnosis not present

## 2017-06-19 LAB — URINE CULTURE

## 2017-06-19 LAB — PROCALCITONIN

## 2017-06-19 MED ORDER — NIFEDIPINE ER 30 MG PO TB24: 30.0000 mg | ORAL_TABLET | Freq: Every day | ORAL | 3 refills | Status: AC

## 2017-06-19 MED ORDER — PANTOPRAZOLE SODIUM 40 MG PO TBEC: 40.0000 mg | DELAYED_RELEASE_TABLET | Freq: Two times a day (BID) | ORAL | 1 refills | Status: AC

## 2017-06-19 MED ORDER — TRAMADOL HCL 50 MG PO TABS: 50.0000 mg | ORAL_TABLET | Freq: Four times a day (QID) | ORAL | 0 refills | Status: AC | PRN

## 2017-06-19 MED ORDER — PREDNISONE 10 MG PO TABS: ORAL_TABLET | ORAL | 0 refills | Status: DC

## 2017-06-19 MED ORDER — METOPROLOL TARTRATE 25 MG PO TABS: 12.5000 mg | ORAL_TABLET | Freq: Two times a day (BID) | ORAL | 3 refills | Status: AC

## 2017-06-19 NOTE — Progress Notes (Signed)
Pt d/c home; husband at bedside. All d/c papers and 5 prescriptions given to pt (including Tramadol).  Pt understands all d/c instructions.  States she lives in [place] and they are driving back home tomorrow.  Pt in no acute distress;all belongings taken.

## 2017-06-19 NOTE — Progress Notes (Signed)
Progress Note  Patient Name: Sharon Cochran Date of Encounter: 06/19/2017  Primary Cardiologist: Verdis Prime  Subjective   She is doing better today. Chest discomfort is essentially resolved. None of her current presenting complaints were similar to pre-bypass discomfort. She has felt occasional clicking and popping in the sternal region with coughing and different movements.  Inpatient Medications    Scheduled Meds: . aspirin  325 mg Oral Daily  . atorvastatin  80 mg Oral q1800  . enoxaparin (LOVENOX) injection  40 mg Subcutaneous Q24H  . metoprolol tartrate  12.5 mg Oral BID  . mometasone-formoterol  2 puff Inhalation BID  . nicotine  14 mg Transdermal Daily  . NIFEdipine  30 mg Oral Daily  . pantoprazole  40 mg Oral BID  . predniSONE  40 mg Oral Q breakfast  . sodium chloride flush  3 mL Intravenous Q12H  . venlafaxine XR  75 mg Oral Q breakfast   Continuous Infusions: . sodium chloride     PRN Meds: sodium chloride, acetaminophen **OR** acetaminophen, albuterol, HYDROmorphone (DILAUDID) injection, ondansetron **OR** ondansetron (ZOFRAN) IV, oxyCODONE, polyethylene glycol, sodium chloride flush   Vital Signs    Vitals:   06/18/17 1947 06/19/17 0630 06/19/17 0700 06/19/17 0853  BP: 102/61 (!) 90/57    Pulse: 77   78  Resp: 19   20  Temp: 97.6 F (36.4 C) 98.1 F (36.7 C)    TempSrc: Oral Oral    SpO2: 92%   97%  Weight:   162 lb 4.1 oz (73.6 kg)   Height:        Intake/Output Summary (Last 24 hours) at 06/19/17 0911 Last data filed at 06/19/17 0700  Gross per 24 hour  Intake              720 ml  Output              925 ml  Net             -205 ml   Filed Weights   06/17/17 1109 06/18/17 0545 06/19/17 0700  Weight: 156 lb (70.8 kg) 155 lb 6.4 oz (70.5 kg) 162 lb 4.1 oz (73.6 kg)    Telemetry    Normal sinus rhythm - Personally Reviewed  ECG    Sinus rhythm with T-wave abnormality but no evidence of acute ischemia and not significantly  different than the last EKG performed in June. - Personally Reviewed  Physical Exam  Oxygen cannula and her nose. Appears well-nourished/healthy GEN: No acute distress.   Neck: No JVD Cardiac: RRR, no murmurs, rubs, or gallops. . Mild sternal tenderness. Respiratory: Clear to auscultation bilaterally. GI: Soft, nontender, non-distended  MS: No edema; No deformity. Neuro:  Nonfocal  Psych: Normal affect   Labs    Chemistry Recent Labs Lab 06/17/17 1112 06/17/17 1902  NA 137  --   K 4.0  --   CL 104  --   CO2 22  --   GLUCOSE 119*  --   BUN 16  --   CREATININE 0.57 0.57  CALCIUM 9.0  --   GFRNONAA >60 >60  GFRAA >60 >60  ANIONGAP 11  --      Hematology Recent Labs Lab 06/17/17 1112 06/17/17 1902 06/18/17 0731  WBC 12.4* 12.5* 8.5  RBC 4.43 4.58 4.11  HGB 13.9 13.9 12.7  HCT 42.1 43.2 39.6  MCV 95.0 94.3 96.4  MCH 31.4 30.3 30.9  MCHC 33.0 32.2 32.1  RDW 13.5 13.6 13.8  PLT 254 278 247    Cardiac Enzymes Recent Labs Lab 06/17/17 1902 06/18/17 0010 06/18/17 0612  TROPONINI <0.03 <0.03 <0.03    Recent Labs Lab 06/17/17 1121  TROPIPOC 0.00     BNP Recent Labs Lab 06/17/17 1653  BNP 60.7     DDimer No results for input(s): DDIMER in the last 168 hours.   Radiology    Ct Angio Chest Pe W Or Wo Contrast  Result Date: 06/17/2017 CLINICAL DATA:  61 year old female with acute chest pain radiating to left shoulder neck and face. Patient with CABG on 05/22/2017. EXAM: CT ANGIOGRAPHY CHEST WITH CONTRAST TECHNIQUE: Multidetector CT imaging of the chest was performed using the standard protocol during bolus administration of intravenous contrast. Multiplanar CT image reconstructions and MIPs were obtained to evaluate the vascular anatomy. CONTRAST:  100 cc intravenous Isovue 370 COMPARISON:  05/19/2017 chest CT.  06/17/2017 and prior radiographs FINDINGS: Cardiovascular: This is a technically adequate study but respiratory motion artifact limits sensitivity in  several portions of the lungs, greatest in the lower lobes. No pulmonary emboli are identified. There is no evidence of thoracic aortic aneurysm. Recent CABG changes identified. Small amount of pericardial fluid noted. Mediastinum/Nodes: No mediastinal hematoma. No enlarged mediastinal, hilar, or axillary lymph nodes. Thyroid gland, trachea, and esophagus demonstrate no significant findings. Lungs/Pleura: A small left pleural effusion and bilateral lower lobe atelectasis, moderate on the left and mild to moderate on the right. Mild central ground-glass opacities may represent mild interstitial edema. No definite airspace disease noted. There is no evidence of pneumothorax. Upper Abdomen: No acute abnormality Musculoskeletal: No acute abnormality or suspicious lesions. Patient is status post median sternotomy. Review of the MIP images confirms the above findings. IMPRESSION: No evidence of pulmonary emboli or thoracic aortic aneurysm. Mild central ground-glass opacities which may represent mild interstitial pulmonary edema. Status post CABG with small left pleural effusion, moderate left lower lobe and mild to moderate right lower lobe atelectasis. Electronically Signed   By: Harmon Pier M.D.   On: 06/17/2017 15:35   Dg Chest Portable 1 View  Result Date: 06/17/2017 CLINICAL DATA:  Pain. EXAM: PORTABLE CHEST 1 VIEW COMPARISON:  06/06/2017 . FINDINGS: Prior CABG. Heart size stable. Mild bibasilar subsegmental atelectasis and/or scarring again noted. No pleural effusion or pneumothorax . IMPRESSION: 1.  Prior CABG.  Heart size stable. 2. Mild bibasilar subsegmental atelectasis and/or scarring. Electronically Signed   By: Maisie Fus  Register   On: 06/17/2017 11:39    Cardiac Studies   No new studies have been ordered.  Patient Profile     61 y.o. female with chronic smoking history, probable component of COPD, ostial LAD disease recently treated with LIMA to LAD on June 6. Admitted on this occasion with severe  chest discomfort upon movement and palpation with changes in intensity that are positional. Has had occasional clicking and popping sensation in the sternal region and has numbness and discomfort in the left parasternal region following mammary takedown.  Assessment & Plan    1. Musculoskeletal chest pain in this patient who is status post recent LIMA to LAD surgery less than 6 weeks ago. Discomfort is likely related to activity related disruption of sternal healing and she complains of occasional clicking and popping in the chest wall. Hopefully this will improve over time. 2. Coronary artery disease with LIMA to LAD and no evidence of myocardial ischemia or angina. 3. Prior history of tobacco use. Still abstaining from cigarette smoking since bypass surgery.   Overall  she seems to be doing well. I believe she is eligible for discharge. No specific cardiac workup is felt indicated at this time.  Signed, Lesleigh NoeHenry W Irianna Gilday III, MD  06/19/2017, 9:11 AM

## 2017-06-19 NOTE — Discharge Instructions (Signed)
Chest Wall Pain °Chest wall pain is pain in or around the bones and muscles of your chest. Sometimes, an injury causes this pain. Sometimes, the cause may not be known. This pain may take several weeks or longer to get better. °Follow these instructions at home: °Pay attention to any changes in your symptoms. Take these actions to help with your pain: °· Rest as told by your doctor. °· Avoid activities that cause pain. Try not to use your chest, belly (abdominal), or side muscles to lift heavy things. °· If directed, apply ice to the painful area: °? Put ice in a plastic bag. °? Place a towel between your skin and the bag. °? Leave the ice on for 20 minutes, 2-3 times per day. °· Take over-the-counter and prescription medicines only as told by your doctor. °· Do not use tobacco products, including cigarettes, chewing tobacco, and e-cigarettes. If you need help quitting, ask your doctor. °· Keep all follow-up visits as told by your doctor. This is important. ° °Contact a doctor if: °· You have a fever. °· Your chest pain gets worse. °· You have new symptoms. °Get help right away if: °· You feel sick to your stomach (nauseous) or you throw up (vomit). °· You feel sweaty or light-headed. °· You have a cough with phlegm (sputum) or you cough up blood. °· You are short of breath. °This information is not intended to replace advice given to you by your health care provider. Make sure you discuss any questions you have with your health care provider. °Document Released: 05/21/2008 Document Revised: 05/10/2016 Document Reviewed: 02/28/2015 °Elsevier Interactive Patient Education © 2018 [employer] ° °

## 2017-06-19 NOTE — Discharge Summary (Addendum)
Physician Discharge Summary   Patient ID: MAYUMI SUMMERSON MRN: [id number] DOB/AGE: 1956/10/22 61 y.o.  Admit date: 06/17/2017 Discharge date: 06/19/2017  Primary Care Physician:  Dr. Katrinka Blazing lives in [place]  Discharge Diagnoses:    . Atypical Chest pain . Smoker . GERD (gastroesophageal reflux disease) . COPD (chronic obstructive pulmonary disease) (HCC)   Consults:  Cardiology Cardiothoracic surgery, Dr. Dorris Fetch  Recommendations for Outpatient Follow-up:  1. Please repeat CBC/BMET at next visit   DIET: Heart healthy diet    Allergies:   Allergies  Allergen Reactions  . No Known Allergies      DISCHARGE MEDICATIONS: Current Discharge Medication List    START taking these medications   Details  NIFEdipine (PROCARDIA-XL/ADALAT CC) 30 MG 24 hr tablet Take 1 tablet (30 mg total) by mouth daily. Qty: 30 tablet, Refills: 3    pantoprazole (PROTONIX) 40 MG tablet Take 1 tablet (40 mg total) by mouth 2 (two) times daily before a meal. Qty: 60 tablet, Refills: 1    predniSONE (DELTASONE) 10 MG tablet Prednisone dosing: Take  Prednisone 40mg  (4 tabs) x 2 days, then taper to 30mg  (3 tabs) x 2 days, then 20mg  (2 tabs) x 2days, then 10mg  (1 tab) x 2days, then OFF. Qty: 20 tablet, Refills: 0    traMADol (ULTRAM) 50 MG tablet Take 1 tablet (50 mg total) by mouth every 6 (six) hours as needed for moderate pain or severe pain. Qty: 20 tablet, Refills: 0      CONTINUE these medications which have CHANGED   Details  metoprolol tartrate (LOPRESSOR) 25 MG tablet Take 0.5 tablets (12.5 mg total) by mouth 2 (two) times daily. Qty: 60 tablet, Refills: 3      CONTINUE these medications which have NOT CHANGED   Details  albuterol (PROVENTIL) (2.5 MG/3ML) 0.083% nebulizer solution Take 2.5 mg by nebulization every 6 (six) hours as needed for wheezing or shortness of breath.    aspirin EC 325 MG EC tablet Take 1 tablet (325 mg total) by mouth daily. Qty: 30 tablet, Refills:  0    atorvastatin (LIPITOR) 80 MG tablet Take 1 tablet (80 mg total) by mouth daily at 6 PM. Qty: 30 tablet, Refills: 1    Fluticasone-Salmeterol (ADVAIR) 250-50 MCG/DOSE AEPB Inhale 1 puff into the lungs 2 (two) times daily.    metroNIDAZOLE (METROGEL) 1 % gel Apply 1 application topically 2 (two) times daily.     nicotine (NICODERM CQ - DOSED IN MG/24 HOURS) 14 mg/24hr patch Place 1 patch (14 mg total) onto the skin daily. Qty: 28 patch, Refills: 1    venlafaxine XR (EFFEXOR-XR) 75 MG 24 hr capsule Take 75 mg by mouth daily with breakfast.    albuterol (PROVENTIL HFA;VENTOLIN HFA) 108 (90 Base) MCG/ACT inhaler Inhale 1-2 puffs into the lungs every 6 (six) hours as needed for wheezing or shortness of breath.      STOP taking these medications     omeprazole (PRILOSEC) 20 MG capsule          Brief H and P: For complete details please refer to admission H and P, but in brief Patient is a 61 year old female with tobacco abuse, PUD, COPD, CAD with NSTEMI status post CABG on 6/ 6/18 presented to ED with acute onset of chest pain described as worse than when I had my heart attack. Pain radiated to her left shoulder and into her neck. Patient was brought in by EMS after she had taken 4 baby aspirin and was given  2 nitroglycerin but the pain was not relieved. Serial troponins were negative, EKG did not show evidence of acute MI. Patient was given fentanyl and Dilaudid in the ED which relieved the pain. CT showed mild interstitial pulmonary edema and small pleural effusion on the left and mild to moderate right lower lobe atelectasis. Patient had mild leukocytosis, lactate of 2.1 and was admitted for further workup.    Hospital Course:     Chest pain, atypical possibly musculoskeletal - Improved, no evidence of PE or pneumonia on the chest x-ray or CTA scanning. - Cardiology and cardiothoracic surgery was consulted as patient recently had a CABG on 05/22/17 - No pericardial effusion was noted  on CT scan, cardiology recommended treating for musculoskeletal pain, sternotomy was stable and did not appear to be infected. Blood cultures were negative. - Per cardiothoracic surgery, Dr. Dorris FetchHendrickson, possibly could be diffuse differential doubt pericarditis as there is no effusion or rub. Recommended short course of steroids and add a low-dose calcium channel blocker and increase her Protonix to twice a day. - Patient had remarkable improvement in the chest pain by the time of discharge. She was given 20 pills off tramadol as needed for pain. She states that she ran out of OxyContin IR, recommended to follow up with her PCP regarding her narcotics prescription.     GERD (gastroesophageal reflux disease) - Continue PPI twice a day    Smoker - Patient was counseled to quit smoking    COPD (chronic obstructive pulmonary disease) (HCC) - Currently stable, no wheezing continue inhalers outpatient    S/P CABG x 1, H/O non-ST elevation myocardial infarction (NSTEMI) - Seen by cardiology and cardiothoracic surgery, currently stable. Outpatient follow-up with cardiothoracic surgery. Patient has established cardiologist in [place], she will follow-up.   Day of Discharge BP (!) 103/57 (BP Location: Right Arm)   Pulse 75   Temp 98.1 F (36.7 C) (Oral)   Resp 16   Ht 5' (1.524 m)   Wt 73.6 kg (162 lb 4.1 oz)   SpO2 91%   BMI 31.69 kg/m   Physical Exam: General: Alert and awake oriented x3 not in any acute distress. HEENT: anicteric sclera, pupils reactive to light and accommodation CVS: S1-S2 clear no murmur rubs or gallops Chest: clear to auscultation bilaterally, no wheezing rales or rhonchi Abdomen: soft nontender, nondistended, normal bowel sounds Extremities: no cyanosis, clubbing or edema noted bilaterally Neuro: Cranial nerves II-XII intact, no focal neurological deficits   The results of significant diagnostics from this hospitalization (including imaging,  microbiology, ancillary and laboratory) are listed below for reference.    LAB RESULTS: Basic Metabolic Panel:  Recent Labs Lab 06/17/17 1112 06/17/17 1902  NA 137  --   K 4.0  --   CL 104  --   CO2 22  --   GLUCOSE 119*  --   BUN 16  --   CREATININE 0.57 0.57  CALCIUM 9.0  --    Liver Function Tests: No results for input(s): AST, ALT, ALKPHOS, BILITOT, PROT, ALBUMIN in the last 168 hours. No results for input(s): LIPASE, AMYLASE in the last 168 hours. No results for input(s): AMMONIA in the last 168 hours. CBC:  Recent Labs Lab 06/17/17 1902 06/18/17 0731  WBC 12.5* 8.5  NEUTROABS  --  5.0  HGB 13.9 12.7  HCT 43.2 39.6  MCV 94.3 96.4  PLT 278 247   Cardiac Enzymes:  Recent Labs Lab 06/18/17 0010 06/18/17 0612  TROPONINI <0.03 <0.03  BNP: Invalid input(s): POCBNP CBG: No results for input(s): GLUCAP in the last 168 hours.  Significant Diagnostic Studies:  Ct Angio Chest Pe W Or Wo Contrast  Result Date: 06/17/2017 CLINICAL DATA:  61 year old female with acute chest pain radiating to left shoulder neck and face. Patient with CABG on 05/22/2017. EXAM: CT ANGIOGRAPHY CHEST WITH CONTRAST TECHNIQUE: Multidetector CT imaging of the chest was performed using the standard protocol during bolus administration of intravenous contrast. Multiplanar CT image reconstructions and MIPs were obtained to evaluate the vascular anatomy. CONTRAST:  100 cc intravenous Isovue 370 COMPARISON:  05/19/2017 chest CT.  06/17/2017 and prior radiographs FINDINGS: Cardiovascular: This is a technically adequate study but respiratory motion artifact limits sensitivity in several portions of the lungs, greatest in the lower lobes. No pulmonary emboli are identified. There is no evidence of thoracic aortic aneurysm. Recent CABG changes identified. Small amount of pericardial fluid noted. Mediastinum/Nodes: No mediastinal hematoma. No enlarged mediastinal, hilar, or axillary lymph nodes. Thyroid gland,  trachea, and esophagus demonstrate no significant findings. Lungs/Pleura: A small left pleural effusion and bilateral lower lobe atelectasis, moderate on the left and mild to moderate on the right. Mild central ground-glass opacities may represent mild interstitial edema. No definite airspace disease noted. There is no evidence of pneumothorax. Upper Abdomen: No acute abnormality Musculoskeletal: No acute abnormality or suspicious lesions. Patient is status post median sternotomy. Review of the MIP images confirms the above findings. IMPRESSION: No evidence of pulmonary emboli or thoracic aortic aneurysm. Mild central ground-glass opacities which may represent mild interstitial pulmonary edema. Status post CABG with small left pleural effusion, moderate left lower lobe and mild to moderate right lower lobe atelectasis. Electronically Signed   By: Harmon Pier M.D.   On: 06/17/2017 15:35   Dg Chest Portable 1 View  Result Date: 06/17/2017 CLINICAL DATA:  Pain. EXAM: PORTABLE CHEST 1 VIEW COMPARISON:  06/06/2017 . FINDINGS: Prior CABG. Heart size stable. Mild bibasilar subsegmental atelectasis and/or scarring again noted. No pleural effusion or pneumothorax . IMPRESSION: 1.  Prior CABG.  Heart size stable. 2. Mild bibasilar subsegmental atelectasis and/or scarring. Electronically Signed   By: Maisie Fus  Register   On: 06/17/2017 11:39    2D ECHO:   Disposition and Follow-up: Discharge Instructions    Diet - low sodium heart healthy    Complete by:  As directed    Discharge instructions    Complete by:  As directed    Please check your BP daily. If it is running low in 80's-90's and you feel dizzy or lightheaded, please stop nifedipine.   Increase activity slowly    Complete by:  As directed        DISPOSITION:Home   DISCHARGE FOLLOW-UP Follow-up Information    Loreli Slot, MD. Schedule an appointment as soon as possible for a visit in 2 week(s).   Specialty:  Cardiothoracic  Surgery Why:  for hospital follow-up  Contact information: [address] [employer] [address] [place] [address] [phone]            Time spent on Discharge: 35 minutes  Signed:   Thad Ranger M.D. [facility] 06/19/2017, 11:30 AM Pager: [phone]

## 2017-06-22 LAB — CULTURE, BLOOD (ROUTINE X 2)
CULTURE: NO GROWTH
CULTURE: NO GROWTH
Special Requests: ADEQUATE
Special Requests: ADEQUATE

## 2017-06-24 ENCOUNTER — Telehealth: Payer: Self-pay | Admitting: Cardiology

## 2017-06-24 NOTE — Telephone Encounter (Signed)
New message    Fax for cardiac rehab was sent over 3 weeks ago . Checking on the status .    Patient is been seen today.

## 2017-06-24 NOTE — Telephone Encounter (Signed)
Left message Dr Anne FuSkains has not been in the office and will return 7/16.  Once it has been signed I will fax it back to them.  Requested Jasmine DecemberSharon c/b if further questions/concerns.

## 2017-07-16 ENCOUNTER — Ambulatory Visit: Payer: Self-pay | Admitting: Thoracic Surgery (Cardiothoracic Vascular Surgery)

## 2017-07-17 ENCOUNTER — Other Ambulatory Visit: Payer: Self-pay | Admitting: Physician Assistant

## 2017-08-05 ENCOUNTER — Other Ambulatory Visit: Payer: Self-pay | Admitting: Thoracic Surgery (Cardiothoracic Vascular Surgery)

## 2017-08-05 DIAGNOSIS — Z951 Presence of aortocoronary bypass graft: Secondary | ICD-10-CM

## 2017-08-06 ENCOUNTER — Encounter: Payer: Self-pay | Admitting: Thoracic Surgery (Cardiothoracic Vascular Surgery)

## 2017-08-06 ENCOUNTER — Ambulatory Visit
Admission: RE | Admit: 2017-08-06 | Discharge: 2017-08-06 | Disposition: A | Discharge status: Home / Self Care | Payer: BLUE CROSS/BLUE SHIELD | Source: Ambulatory Visit | Attending: Thoracic Surgery (Cardiothoracic Vascular Surgery) | Admitting: Thoracic Surgery (Cardiothoracic Vascular Surgery)

## 2017-08-06 ENCOUNTER — Ambulatory Visit (INDEPENDENT_AMBULATORY_CARE_PROVIDER_SITE_OTHER): Payer: Self-pay | Admitting: Thoracic Surgery (Cardiothoracic Vascular Surgery)

## 2017-08-06 VITALS — BP 103/70 | HR 71 | Resp 16 | Ht 60.0 in | Wt 155.0 lb

## 2017-08-06 DIAGNOSIS — Z951 Presence of aortocoronary bypass graft: Secondary | ICD-10-CM

## 2017-08-06 NOTE — Progress Notes (Signed)
[address]       [name] [address]             [phone]     HPI: Sharon Cochran returns for a postoperative follow-up visit  She is a 61 year old woman with a history of tobacco abuse and COPD. She was visiting [place] and had severe chest pain. She ruled in for a non-ST elevation MI with a troponin of 0.92. She underwent cardiac catheterization which showed an ostial LAD stenosis. The FFR was 0.88 cm the initial plan was to manage her medically. However she had recurrent unstable chest pain. It was not entirely certain that her pain was cardiac in nature but given her enzyme bump in the appearance of her LAD lesion it was felt safest to proceed with bypass grafting.  She had off-pump CABG 1 with a left mammary to LAD on 05/22/2017. Postoperative course initially was uncomplicated and she was discharged on day 4. She was readmitted on 06/17/2017 with a complaint of chest pain that was pleuritic in nature. She did not improve with nitroglycerin. She ruled out for myocardial infarction. Workup showed no evidence of pulmonary embolus. 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 Last week she began having pain under the left breast that was worse with deep breath and some positions. She had a CT which showed some fluid in the left chest. There was no evidence of PE. She was started on Naprosyn and colchicine. Her pain has improved since those of started.  Past Medical History:  Diagnosis Date  . COPD (chronic obstructive pulmonary disease) (HCC)   . Coronary artery disease   . GERD (gastroesophageal reflux disease)   . Non-STEMI (non-ST elevated myocardial infarction) (HCC) 05/20/2017     Current Outpatient Prescriptions  Medication Sig Dispense Refill  . albuterol (PROVENTIL HFA;VENTOLIN HFA) 108 (90 Base) MCG/ACT inhaler Inhale 1-2 puffs into the lungs every 6 (six) hours as needed for wheezing or shortness of breath.    Marland Kitchen  albuterol (PROVENTIL) (2.5 MG/3ML) 0.083% nebulizer solution Take 2.5 mg by nebulization every 6 (six) hours as needed for wheezing or shortness of breath.    Marland Kitchen aspirin EC 325 MG EC tablet Take 1 tablet (325 mg total) by mouth daily. 30 tablet 0  . atorvastatin (LIPITOR) 80 MG tablet Take 1 tablet (80 mg total) by mouth daily at 6 PM. 30 tablet 1  . colchicine 0.6 MG tablet Take 0.6 mg by mouth 2 (two) times daily.    . Fluticasone-Salmeterol (ADVAIR) 250-50 MCG/DOSE AEPB Inhale 1 puff into the lungs 2 (two) times daily.    . metoprolol tartrate (LOPRESSOR) 25 MG tablet Take 0.5 tablets (12.5 mg total) by mouth 2 (two) times daily. 60 tablet 3  . naproxen sodium (ANAPROX) 220 MG tablet Take 500 mg by mouth 2 (two) times daily with a meal.    . NIFEdipine (PROCARDIA-XL/ADALAT CC) 30 MG 24 hr tablet Take 1 tablet (30 mg total) by mouth daily. 30 tablet 3  . pantoprazole (PROTONIX) 40 MG tablet Take 1 tablet (40 mg total) by mouth 2 (two) times daily before a meal. 60 tablet 1  . traMADol (ULTRAM) 50 MG tablet Take 1 tablet (50 mg total) by mouth every 6 (six) hours as needed for moderate pain or severe pain. 20 tablet 0  . venlafaxine XR (EFFEXOR-XR) 75 MG 24 hr capsule Take 75 mg by mouth daily with breakfast.     No  current facility-administered medications for this visit.     Physical Exam BP 103/70 (BP Location: Right Arm, Patient Position: Sitting, Cuff Size: Large)   Pulse 71   Resp 16   Ht 5' (1.524 m)   Wt 155 lb (70.3 kg)   SpO2 96% Comment: RA  BMI 30.10 kg/m  61 year old woman in no acute distress, very anxious Alert and oriented 3 with no focal deficits Sternum stable, incision healing well Cardiac regular rate and rhythm normal S1 and S2 Lungs clear with equal breath sounds bilaterally No peripheral edema  Diagnostic Tests: CHEST  2 VIEW  COMPARISON:  CT chest 06/17/2017  FINDINGS: There is no focal parenchymal opacity. There is no pleural effusion or pneumothorax.  There is evidence of prior CABG. The heart size is normal.  The osseous structures are unremarkable.  IMPRESSION: No active cardiopulmonary disease.   Electronically Signed   By: Elige Ko   On: 08/06/2017 10:33 I personally reviewed the chest x-ray current findings noted above. There does appear to be a very minimal amount of fluid in the left chest.  Impression: Sharon Cochran is a 61 year old woman with multiple cardiac risk factors who presented with unstable chest pain and an elevated troponin. Catheterization revealed single-vessel coronary disease. She did not respond to medical therapy and had ongoing chest pain requiring IV heparin and nitroglycerin. She underwent coronary bypass grafting 1 on 05/22/2017.  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 That improved with steroids but she had recurrent symptoms recently. She was treated by Dr. Orvan Falconer her cardiologist in [facility] with naproxen and colchicine. With that her symptoms have improved dramatically once again.  She is extremely anxious about the fact that there was little to no fluid present on the chest x-ray today. I reassured her that that was indicative of a response to treatment.  Her activities are unrestricted at this point in time.  Plan: She will follow up with Dr. Orvan Falconer in [place]. I will be happy to see her back any time if I can be of any further assistance with her care.  Loreli Slot, MD [facility] [phone]

## 2018-02-24 IMAGING — CR DG CHEST 2V
2 series · 2 of 2 positions shown · non-contrast
Comparison: 05/25/2017 .

CLINICAL DATA: CABG.

EXAM:
CHEST  2 VIEW

[w chest pa]
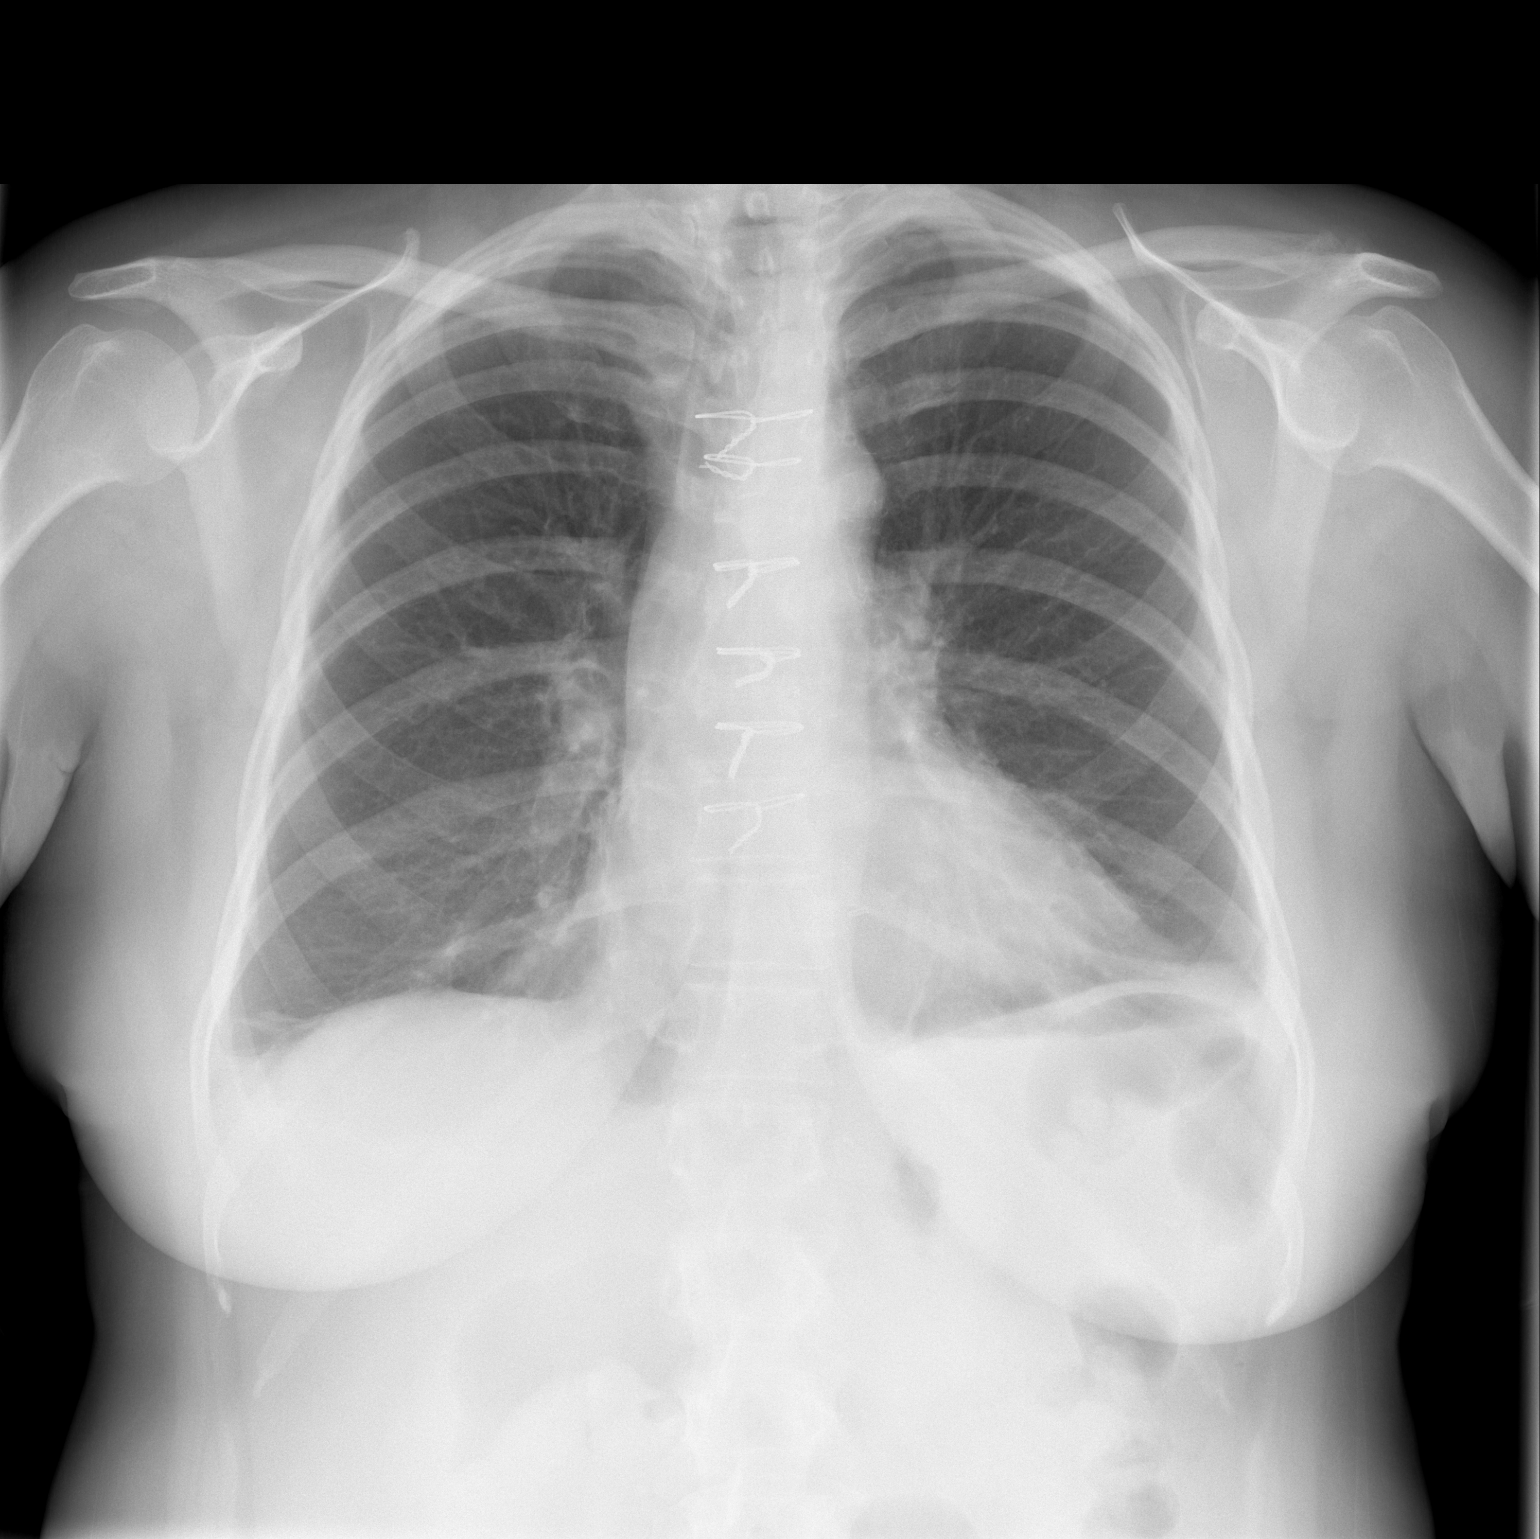

[w chest lat]
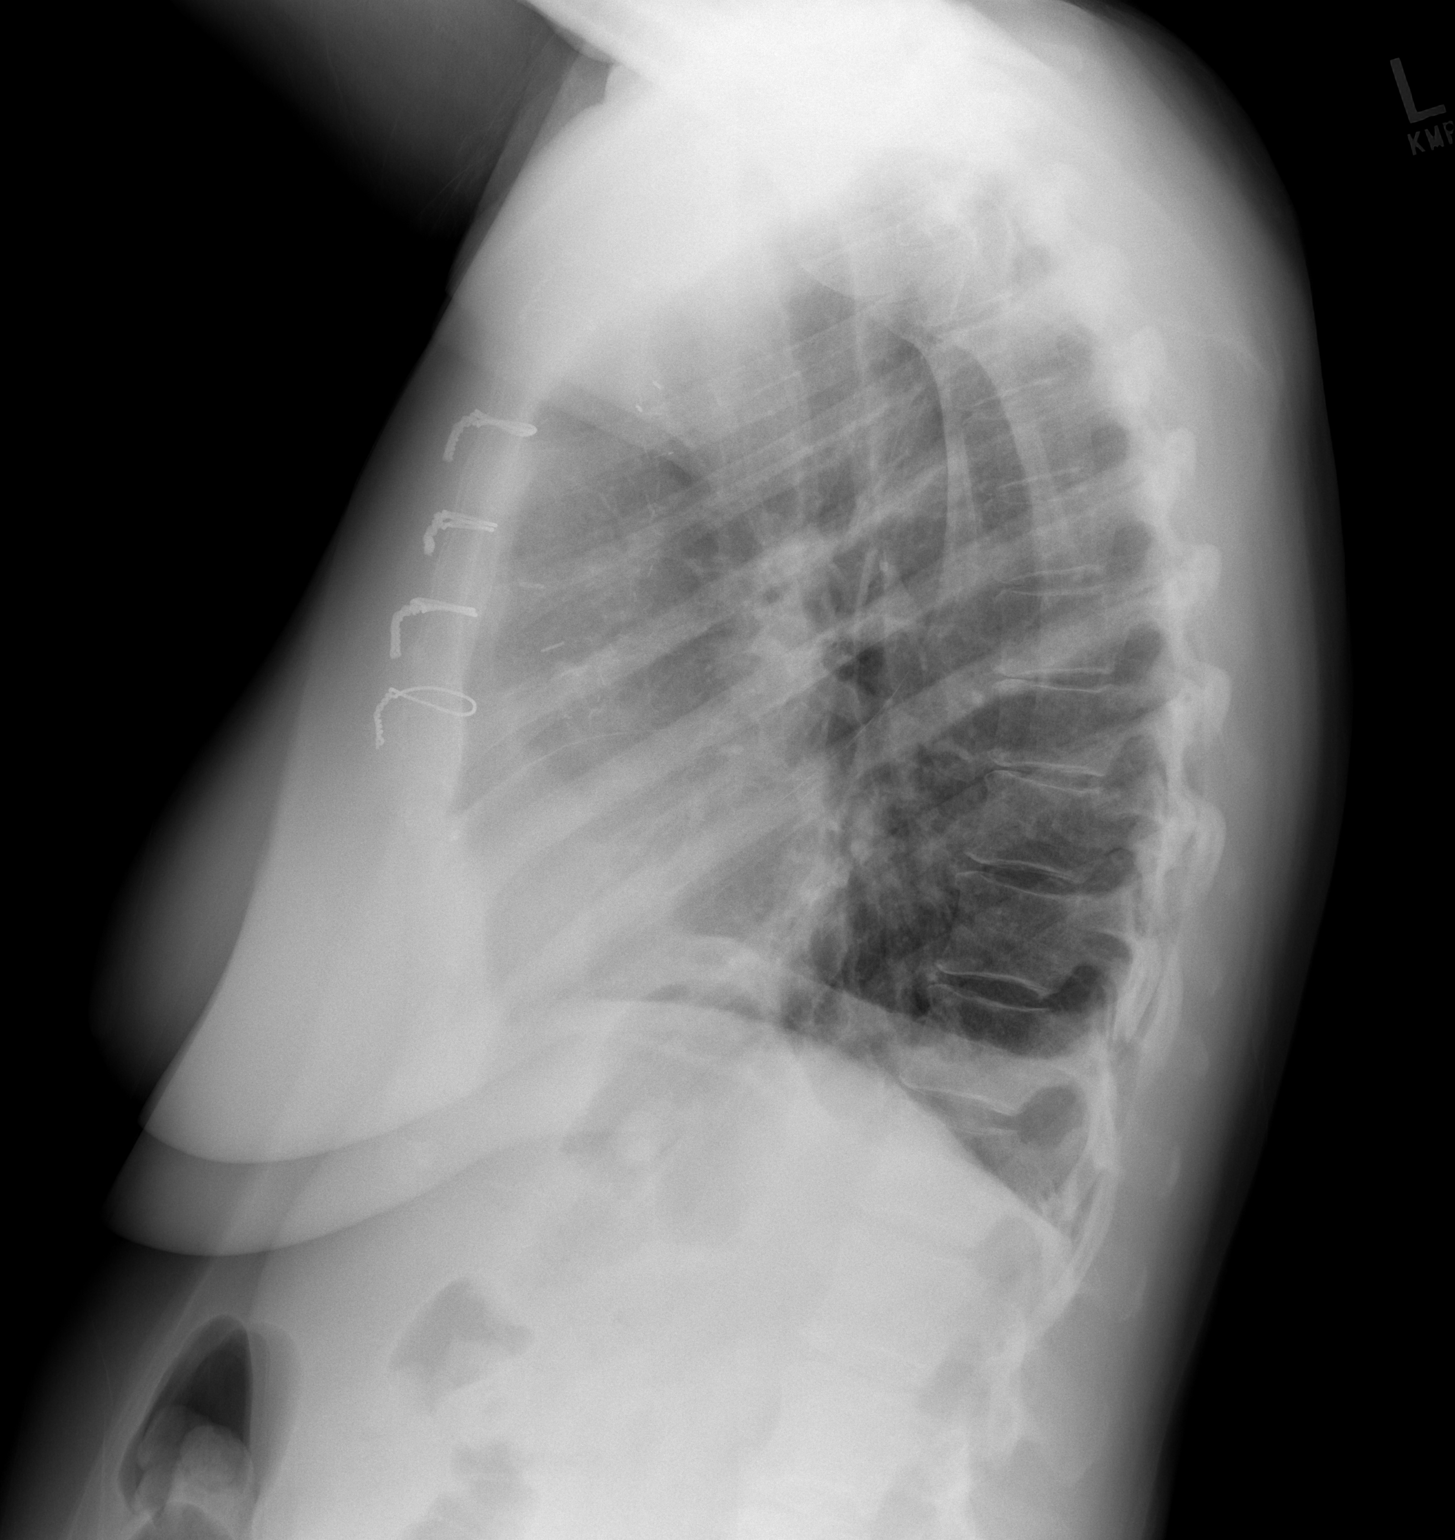

[2 of 2 positions shown; findings below may reference images not displayed]

FINDINGS: Prior median sternotomy and CABG. Heart size normal. Low lung
volumes with mild basilar atelectasis in small pleural effusions.
Improved from prior exam. No pneumothorax.
IMPRESSION: 1. Low lung volumes with mild basilar atelectasis and pleural
effusions. Improved from prior exam.

2.  Prior median sternotomy.  Heart size normal.

## 2018-02-28 ENCOUNTER — Ambulatory Visit: Admit: 2018-02-28 | Discharge: 2018-02-28 | Attending: Physician Assistant | Primary: Internal Medicine

## 2018-02-28 DIAGNOSIS — Z0289 Encounter for other administrative examinations: Secondary | ICD-10-CM

## 2018-02-28 NOTE — Telephone Encounter (Signed)
Called patient back and she stated you had all the sheets they provided her with and the note said what you felt was appropriate so she could get to work and use her respirator I told her we don't want to send her somewhere or have tests that are not needed. I told her we I would forward the note to you and I am really not sure what needs to be done you might need to call her once again she said she was here for 2hrs explaining what she needed and I am just not sure.

## 2018-02-28 NOTE — Telephone Encounter (Signed)
Call from patient. States she needs a referral to pulmonology and she needs it asap for her job. Please call her [phone]

## 2018-02-28 NOTE — Telephone Encounter (Signed)
Spoke to pt. She says there is no form that they have to show what is needed it is up the discretion of the provider clearing pt, she says they rec "whatever needs to be done to make you comfortable I can safely wear a respirator".     Her cardiologist is Dr. Donnamae JudeHan Campbell. Had bypass last year and thinks that is why she failed the questionnare. Was told she was not suppose to go to her routine providers for this clearance, that "it needed to be someone outside of that" to avoid bias, pt guesses but is not sure.

## 2018-02-28 NOTE — Progress Notes (Signed)
Kelsey KoyanagiLaura Mullins is a 62 y.o. female who presents to the office today with the following:  No chief complaint on file.      HPI  Pt works at [employer]  Recently completed an Surveyor, quantity[employer]line Respirator Medical Evaluation in order to be medically cleared to wear a respirator at her job per [employer] guidelines.  Based on her answers, there were some concerns on how a respirator would affect her health and ability to perform her job.    Just had CABG June. Thinks that is why she failed.  Was told could not go to cardiologist for clearance??  Pt not sure what she really needs.   Has paperwork but nothing to be filled out or stating any specific testing required.  H/o asthma and is a smoker.   Otherwise feeling well with no other complaints or acute concerns.    Review of Systems   Constitutional: Negative for malaise/fatigue.   Respiratory: Negative for cough and shortness of breath.    Cardiovascular: Negative for chest pain, palpitations and leg swelling.   Gastrointestinal: Negative.    Genitourinary: Negative.    Neurological: Negative.      See HPI.    Past Medical History:   Diagnosis Date   ??? Asthma    ??? Depression    ??? GERD (gastroesophageal reflux disease)        Past Surgical History:   Procedure Laterality Date   ??? HX HEART VALVE SURGERY  2018    single bypass       No Known Allergies    Current Outpatient Medications   Medication Sig   ??? atorvastatin (LIPITOR) 40 mg tablet    ??? metoprolol tartrate (LOPRESSOR) 25 mg tablet Take 25 mg by mouth two (2) times a day. Takes in the evening only.   ??? Omeprazole delayed release (PRILOSEC D/R) 20 mg tablet Take 20 mg by mouth daily.   ??? aspirin (ASPIRIN) 325 mg tablet Take 325 mg by mouth daily.   ??? Dexlansoprazole (DEXILANT) 60 mg CpDB Take  by mouth.   ??? fluticasone-salmeterol (ADVAIR DISKUS) 250-50 mcg/dose diskus inhaler Take 1 Puff by inhalation every twelve (12) hours.     No current facility-administered medications for this visit.        Social History      Socioeconomic History   ??? Marital status: MARRIED     Spouse name: Not on file   ??? Number of children: Not on file   ??? Years of education: Not on file   ??? Highest education level: Not on file   Tobacco Use   ??? Smoking status: Current Every Day Smoker     Packs/day: 0.50   Substance and Sexual Activity   ??? Alcohol use: No   ??? Drug use: No       Family History   Problem Relation Age of Onset   ??? No Known Problems Mother    ??? No Known Problems Father          Physical Exam:  Visit Vitals  BP 98/60 (BP 1 Location: Right arm, BP Patient Position: Sitting)   Pulse 72   Temp 98.4 ??F (36.9 ??C)   Resp 16   Ht 5' (1.524 m)   Wt 151 lb 12.8 oz (68.9 kg)   SpO2 99%   BMI 29.65 kg/m??     Physical Exam   Constitutional: She is oriented to person, place, and time and well-developed, well-nourished, and in no distress.  HENT:   Head: Normocephalic and atraumatic.   Mouth/Throat: Oropharynx is clear and moist.   Eyes: Conjunctivae are normal.   Neck: Normal range of motion. Neck supple.   Cardiovascular: Normal rate and regular rhythm.   Pulmonary/Chest: Effort normal and breath sounds normal.   Abdominal: Soft. There is no tenderness.   Musculoskeletal: She exhibits no edema.   Lymphadenopathy:     She has no cervical adenopathy.   Neurological: She is alert and oriented to person, place, and time. Gait normal.   Skin: Skin is warm and dry.   Psychiatric: Mood and affect normal.   Nursing note and vitals reviewed.      Assessment/Plan:    ICD-10-CM ICD-9-CM    1. Encounter for physical examination related to employment Z02.89 V70.5    pt gave nonworking number. Tried to call company to determine specifically what is required. I am confused why her cardiologist could not clear pt if her CABG last yr is the issue. Also not sure why she was not recommended to go [facility] where they do resp fit/spirometry testing in house if that is what is needed.  Will call around to try to get more info and let pt know.     Seek care for any new sxs or other concerns.  Pt verbalizes understanding and agrees with the plan.    Marlan Palau, PA-C

## 2018-03-03 NOTE — Telephone Encounter (Signed)
Consulted with SP/Dr.Bessler who agrees, I will not be able to clear this pt for what she is asking. I recommend she go to occupational medicine facility, provided her with locations/numbers. May also need clearance from cardiology but may discuss with OM provider.

## 2018-03-04 NOTE — Telephone Encounter (Signed)
Spoke to pt. Contact info for [facility] provided, but also told some other locations she could try, this was just one I was familiar with.

## 2019-09-01 LAB — HEMOGLOBIN A1C: Hemoglobin A1C, External: 6.1 % — ABNORMAL HIGH (ref 4.8–6.0)

## 2022-08-01 ENCOUNTER — Emergency Department: Admit: 2022-08-01 | Payer: PRIVATE HEALTH INSURANCE | Primary: Internal Medicine

## 2022-08-01 ENCOUNTER — Inpatient Hospital Stay
Admission: EM | Admit: 2022-08-01 | Discharge: 2022-08-09 | Disposition: A | Discharge status: Skilled Nursing Facility | Payer: PRIVATE HEALTH INSURANCE | Admitting: Hospitalist

## 2022-08-01 DIAGNOSIS — I629 Nontraumatic intracranial hemorrhage, unspecified: Secondary | ICD-10-CM

## 2022-08-01 DIAGNOSIS — S06321A Contusion and laceration of left cerebrum with loss of consciousness of 30 minutes or less, initial encounter: Secondary | ICD-10-CM

## 2022-08-01 NOTE — ED Triage Notes (Signed)
Pt arrived from [facility] via EMS with CC unresponsive event lasting approximately 3 minutes. 1:1 caregiver stated she became nonverbal and not responding to sternal rub and EMS called. BG 123. LKW: 1819 this evening. +N/V for EMS. CSS- for EMS. BP 120-140s/ 70s. Aox1 at baseline.    PMH: Hemorrhagic stroke, seizures (takes keppra).

## 2022-08-01 NOTE — ED Notes (Signed)
Bedside and Verbal shift change report given to [facility], Charity fundraiser (Cabin crew) by Marcelino Duster, RN (offgoing nurse). Report included the following information Nurse Handoff Report, Index, ED Encounter Summary, ED SBAR, Adult Overview, MAR, and Recent Results.       Olam Idler, RN  08/01/22 2248

## 2022-08-01 NOTE — H&P (Incomplete)
Name: Kelsey Mullins   DOB: 07-15-1956   MRN: [id number]   Date: 08/01/2022      REASON FOR ICU ADMISSION:  ***     PRINCIPLE ICU DIAGNOSIS   ***    PATIENT SUMMARY   Kelsey Mullins is a 66 y.o. female ***     COMPREHENSIVE ASSESSMENT & PLAN:SYSTEM BASED     24 HOUR EVENTS: ***    NEUROLOGICAL:     Analgesia: ***  Sedation: ***  Neuro check Frequency: ***     PULMONOLOGY:     Respiratory Goals: ***    CARDIOVASCULAR:     SBP Goal of: ***  MAP Goal of: ***  *** - For above SBP/MAP goals  IVF: ***    GASTROINTESTINAL     Diet/Feeding: ***    RENAL/ELECTROLYTE/FLUIDS:     Keep K>4; Mg>2    Trend renal indices/ UOP  Trend Chemistry    ENDOCRINE:     Glycemic Control: Goal 120-180: SSI PRN  Prevent hypoglycemia    HEMATOLOGY/ONCOLOGY:     Transfusion Trigger (Hgb): ***  Trend CBC    INFECTIOUS DISEASE:     ANTIBIOTICS TO DATE: ***    CULTURES TO DATE: ***    ICU DAILY CHECKLIST   Code Status:***  DVT Prophylaxis:***  T/L/D: Tubes: ***  Lines: ***  Drains: ***  SUP: ***  Diet: ***  Activity Level:***  ABCDEF Bundle/Checklist Completed: Yes  Disposition: Stay in ICU ***  Multidisciplinary Rounds Completed: ***  Patient/Family Updated: ***  Goals of Care Discussion/Palliative: ***    HOSPITAL COURSE/DAILY EVENT LOG   ***    SUBJECTIVE   ROS:  {Review Of Systems:30496}    OBJECTIVE   Labs and Data: Reviewed 08/01/22  Medications: Reviewed 08/01/22  Imaging: Reviewed 08/01/22    Physical Exam:  {female adult master:310785} or {female adult master:310786}    BP 130/74   Pulse 77   Temp 98.3 F (36.8 C) (Oral)   Resp 22   Wt 62.1 kg (136 lb 14.5 oz)   SpO2 97%      Temp (24hrs), Avg:97.9 F (36.6 C), Min:97.5 F (36.4 C), Max:98.3 F (36.8 C)           Intake/Output:   No intake or output data in the 24 hours ending 08/01/22 2335    Imaging:    ***    Echo:  No valid procedures specified.     CRITICAL CARE DOCUMENTATION  I had a face to face encounter with the patient, reviewed and interpreted patient data  including clinical events, labs, images, vital signs, I/O's, and examined patient.  I have discussed the case and the plan and management of the patient's care with the consulting services, the bedside nurses and the respiratory therapist.      NOTE OF PERSONAL INVOLVEMENT IN CARE   This patient has a high probability of imminent, clinically significant deterioration, which requires the highest level of preparedness to intervene urgently. I participated in the decision-making and personally managed or directed the management of the following life and organ supporting interventions that required my frequent assessment to treat or prevent imminent deterioration.    I personally spent *** minutes of critical care time.  This is time spent at this critically ill patient's bedside actively involved in patient care as well as the coordination of care.  This does not include any procedural time which has been billed separately.    Jeanell Sparrow, NP-C    Critical Care  Medicine  [employer]

## 2022-08-01 NOTE — Progress Notes (Signed)
Neurocritical Care Code Stroke Documentation      Symptoms:  Unresponsiveness, L Frontal IPH for which stroke code was initiated    Baseline mRS:  Unknown   Last Known Well: Appx 1800 on 8/16   Medical hx: No past medical history on file.   Anticoagulation: None    VAN: Negative    NIHSS:   1a-LOC: 0     1b-Month/Age: 66     1c-Open/Close Hand: 0    2-Best Gaze:0    3-Visual Fields: 0     4-Facial Palsy:0    5a-Left Arm:0    5b-Right Arm:0    6a-Left Leg:0    6b-Right Leg: 1    7-Limb Ataxia:    8-Sensory: 0     9-Best Language: 2    10-Dysarthria: 0    11-Extinction/Inattention:0  TOTAL SCORE: 3    Imaging:   CT Head: L Frontal Lobe IPH     CTA Head and Neck: Not obtained    CTP: Not obtained    Plan:   TNK Candidate: NO    Mechanical thrombectomy Candidate: NO     *Perform dysphagia screening prior to any PO intake*    Discussed with: ER Physician     Arrival time: 1940  Time spent: 30 minutes.     Merry Lofty, APRN - CNP  Neurocritical Care Nurse Practitioner

## 2022-08-01 NOTE — Progress Notes (Signed)
Consult received  likely had seizure  may have bled into a stroke although can't be ruled out that this is an evolution of a older left frontal hemorrhage  would recommend MRI with contrast to r/o underlying mass. Will follow as needed

## 2022-08-01 NOTE — ED Provider Notes (Signed)
[facility] EMERGENCY DEP  EMERGENCY DEPARTMENT ENCOUNTER      Pt Name: Kelsey Mullins  MRN: [id number]  Birthdate 12/20/1955  Date of evaluation: 08/01/2022  Provider: Casimiro Needle, MD    CHIEF COMPLAINT       Chief Complaint   Patient presents with    Seizures         HISTORY OF PRESENT ILLNESS   (Location/Symptom, Timing/Onset, Context/Setting, Quality, Duration, Modifying Factors, Severity)  Note limiting factors.   Patient is a 66 year old female presenting emergency department for episode of unresponsiveness.  Patient currently in inpatient rehab at [facility] after being diagnosed with an intraparenchymal hemorrhage at [facility] patient today approximate 614 had an episode where she became unresponsive with staring vitals were stable patient was in no acute respiratory distress not responding to sternal rubs immediately came back to baseline physician at [facility] called as concern for possible seizure-like activity as patient presented like this prior to her last CVA.  On arrival patient has no complaints however while on EMS stretcher started to have projectile vomiting.  Patient went over to CT for Noncon CT head showing a new left frontal intracranial hemorrhage.  Code S level 1 called.    On chart review from [facility] CVA showed a large left frontal intraparenchymal hemorrhage with extension into the ventricles and complexity subarachnoid blood redistribution companied by approximate 6 mm of midline shift to the right CTA did not show any aneurysm or AVM occlusion or dissection.          Review of External Medical Records:     Nursing Notes were reviewed.    REVIEW OF SYSTEMS    (2-9 systems for level 4, 10 or more for level 5)     Review of Systems    Except as noted above the remainder of the review of systems was reviewed and negative.       PAST MEDICAL HISTORY   No past medical history on file.      SURGICAL HISTORY     No past surgical history on file.      CURRENT MEDICATIONS        Previous Medications    No medications on file       ALLERGIES     Crestor [rosuvastatin] and Lipitor [atorvastatin]    FAMILY HISTORY     No family history on file.       SOCIAL HISTORY       Social History     Socioeconomic History    Marital status: Married           PHYSICAL EXAM    (up to 7 for level 4, 8 or more for level 5)     ED Triage Vitals   BP Temp Temp src Pulse Resp SpO2 Height Weight   -- -- -- -- -- -- -- --       There is no height or weight on file to calculate BMI.    Physical Exam  Vitals and nursing note reviewed.   Constitutional:       Appearance: Normal appearance.   HENT:      Head: Normocephalic and atraumatic.      Nose: Nose normal.   Eyes:      Extraocular Movements: Extraocular movements intact.      Pupils: Pupils are equal, round, and reactive to light.   Cardiovascular:      Rate and Rhythm: Normal rate and regular rhythm.  Pulses: Normal pulses.      Heart sounds: Normal heart sounds.   Pulmonary:      Effort: Pulmonary effort is normal.      Breath sounds: Normal breath sounds.   Abdominal:      General: Bowel sounds are normal.      Palpations: Abdomen is soft.   Musculoskeletal:         General: Normal range of motion.      Cervical back: Normal range of motion and neck supple.   Skin:     General: Skin is warm and dry.   Neurological:      General: No focal deficit present.      Mental Status: She is alert. She is confused.      GCS: GCS eye subscore is 4. GCS verbal subscore is 5. GCS motor subscore is 6.      Sensory: Sensation is intact.      Motor: Motor function is intact.      Coordination: Coordination abnormal.      Gait: Gait abnormal.   Psychiatric:         Mood and Affect: Mood normal.         Behavior: Behavior normal.       DIAGNOSTIC RESULTS     EKG: All EKG's are interpreted by the Emergency Department Physician who either signs or Co-signs this chart in the absence of a cardiologist.        RADIOLOGY:   Non-plain film images such as CT, Ultrasound and  MRI are read by the radiologist. Plain radiographic images are visualized and preliminarily interpreted by the emergency physician with the below findings:        Interpretation per the Radiologist below, if available at the time of this note:    CT HEAD WO CONTRAST   Final Result   1. Acute intraparenchymal hemorrhage in the left frontal lobe with surrounding   cystic change/encephalomalacia overall measuring 5.2 x 3.3 x 5.4 cm. Surrounding   edema and mass effect results in 6 mm of rightward midline shift. It is unclear   whether this represents acute rebleeding into a chronic hematoma versus   evolution of a recent acute intraparenchymal hemorrhage as given in clinical   history. Comparison with any prior imaging would be very useful.      The findings were called to Dr. Dareen Piano on 08/01/2022 7:50 PM by Oren Binet,   MD.  [phone]              LABS:  Labs Reviewed   CBC WITH AUTO DIFFERENTIAL - Abnormal; Notable for the following components:       Result Value    WBC 11.7 (*)     Neutrophils Absolute 8.5 (*)     All other components within normal limits   COMPREHENSIVE METABOLIC PANEL - Abnormal; Notable for the following components:    Glucose 130 (*)     Creatinine 0.52 (*)     Bun/Cre Ratio 25 (*)     All other components within normal limits   EXTRA TUBES HOLD   TROPONIN   EXTRA TUBES HOLD       All other labs were within normal range or not returned as of this dictation.    EMERGENCY DEPARTMENT COURSE and DIFFERENTIAL DIAGNOSIS/MDM:   Vitals:    Vitals:    08/01/22 1945 08/01/22 2030   BP:  137/80   Pulse: 77 74   Resp: 21 19   Temp:  97.5 F (36.4 C) 98.3 F (36.8 C)   TempSrc: Oral Oral   SpO2: 100% 96%   Weight: 62.1 kg (136 lb 14.5 oz)            Medical Decision Making  Intraparenchymal hemorrhage.  66 year old female present emergency department after having episode of being unresponsive at [facility] for approximate 2 to 3 minutes patient on arrival slightly confused.  CT Noncon obtained appears to  show new intraparenchymal hemorrhage will consult neurosurgery Code S level 1 called.    Amount and/or Complexity of Data Reviewed  Labs: ordered.  Radiology: ordered.  ECG/medicine tests: ordered.    Risk  OTC drugs.  Prescription drug management.  Decision regarding hospitalization.            REASSESSMENT            CONSULTS:  IP CONSULT TO NEUROSURGERY    PROCEDURES:  Unless otherwise noted below, none     Procedures      FINAL IMPRESSION      1. Intraparenchymal hematoma of brain, left, with loss of consciousness of 30 minutes or less, initial encounter [facility])          DISPOSITION/PLAN   DISPOSITION Decision To Admit 08/01/2022 09:03:02 PM      PATIENT REFERRED TO:  No follow-up provider specified.    DISCHARGE MEDICATIONS:  New Prescriptions    No medications on file         (Please note that portions of this note were completed with a voice recognition program.  Efforts were made to edit the dictations but occasionally words are mis-transcribed.)    Casimiro Needle, MD (electronically signed)  Emergency Attending Physician / Physician Assistant / Nurse Practitioner    Total critical care time (not including time spent performing separately reportable procedures): 62 min           Casimiro Needle, MD  08/01/22 2103

## 2022-08-02 ENCOUNTER — Inpatient Hospital Stay: Admit: 2022-08-02 | Payer: PRIVATE HEALTH INSURANCE | Primary: Internal Medicine

## 2022-08-02 LAB — COMPREHENSIVE METABOLIC PANEL
ALT: 34 U/L (ref 12–78)
AST: 21 U/L (ref 15–37)
Albumin/Globulin Ratio: 1.2 (ref 1.1–2.2)
Albumin: 3.7 g/dL (ref 3.5–5.0)
Alk Phosphatase: 64 U/L (ref 45–117)
Anion Gap: 9 mmol/L (ref 5–15)
BUN: 13 MG/DL (ref 6–20)
Bun/Cre Ratio: 25 — ABNORMAL HIGH (ref 12–20)
CO2: 26 mmol/L (ref 21–32)
Calcium: 9.7 MG/DL (ref 8.5–10.1)
Chloride: 103 mmol/L (ref 97–108)
Creatinine: 0.52 MG/DL — ABNORMAL LOW (ref 0.55–1.02)
Est, Glom Filt Rate: >60 mL/min/{1.73_m2} (ref >60)
Globulin: 3.1 g/dL (ref 2.0–4.0)
Glucose: 130 mg/dL — ABNORMAL HIGH (ref 65–100)
Potassium: 3.6 mmol/L (ref 3.5–5.1)
Sodium: 138 mmol/L (ref 136–145)
Total Bilirubin: 0.3 MG/DL (ref 0.2–1.0)
Total Protein: 6.8 g/dL (ref 6.4–8.2)

## 2022-08-02 LAB — CBC
Hematocrit: 44.5 % (ref 35.0–47.0)
Hemoglobin: 14.9 g/dL (ref 11.5–16.0)
MCH: 32.1 PG (ref 26.0–34.0)
MCHC: 33.5 g/dL (ref 30.0–36.5)
MCV: 95.9 FL (ref 80.0–99.0)
MPV: 9.4 FL (ref 8.9–12.9)
Nucleated RBCs: 0 PER 100 WBC
Platelets: 207 10*3/uL (ref 150–400)
RBC: 4.64 M/uL (ref 3.80–5.20)
RDW: 12.5 % (ref 11.5–14.5)
WBC: 8.6 10*3/uL (ref 3.6–11.0)
nRBC: 0 10*3/uL (ref 0.00–0.01)

## 2022-08-02 LAB — BASIC METABOLIC PANEL
Anion Gap: 7 mmol/L (ref 5–15)
BUN: 9 MG/DL (ref 6–20)
Bun/Cre Ratio: 17 (ref 12–20)
CO2: 25 mmol/L (ref 21–32)
Calcium: 9.9 MG/DL (ref 8.5–10.1)
Chloride: 103 mmol/L (ref 97–108)
Creatinine: 0.54 MG/DL — ABNORMAL LOW (ref 0.55–1.02)
Est, Glom Filt Rate: >60 mL/min/{1.73_m2} (ref >60)
Glucose: 151 mg/dL — ABNORMAL HIGH (ref 65–100)
Potassium: 4.1 mmol/L (ref 3.5–5.1)
Sodium: 135 mmol/L — ABNORMAL LOW (ref 136–145)

## 2022-08-02 LAB — BASIC METABOLIC PANEL W/ REFLEX TO MG FOR LOW K
Anion Gap: 9 mmol/L (ref 5–15)
Anion Gap: 6 mmol/L (ref 5–15)
BUN: 8 MG/DL (ref 6–20)
BUN: 8 MG/DL (ref 6–20)
Bun/Cre Ratio: 14 (ref 12–20)
Bun/Cre Ratio: 21 — ABNORMAL HIGH (ref 12–20)
CO2: 24 mmol/L (ref 21–32)
CO2: 25 mmol/L (ref 21–32)
Calcium: 9.4 MG/DL (ref 8.5–10.1)
Calcium: 9 MG/DL (ref 8.5–10.1)
Chloride: 106 mmol/L (ref 97–108)
Chloride: 111 mmol/L — ABNORMAL HIGH (ref 97–108)
Creatinine: 0.58 MG/DL (ref 0.55–1.02)
Creatinine: 0.38 MG/DL — ABNORMAL LOW (ref 0.55–1.02)
Est, Glom Filt Rate: >60 mL/min/{1.73_m2} (ref >60)
Est, Glom Filt Rate: >60 mL/min/{1.73_m2} (ref >60)
Glucose: 144 mg/dL — ABNORMAL HIGH (ref 65–100)
Glucose: 127 mg/dL — ABNORMAL HIGH (ref 65–100)
Potassium: 3.8 mmol/L (ref 3.5–5.1)
Potassium: 3.7 mmol/L (ref 3.5–5.1)
Sodium: 139 mmol/L (ref 136–145)
Sodium: 142 mmol/L (ref 136–145)

## 2022-08-02 LAB — URINE DRUG SCREEN
Amphetamine, Urine: NEGATIVE
Barbiturates, Urine: NEGATIVE
Benzodiazepines, Urine: NEGATIVE
Cocaine, Urine: NEGATIVE
Methadone, Urine: NEGATIVE
Opiates, Urine: NEGATIVE
PCP, Urine: NEGATIVE
THC, TH-Cannabinol, Urine: NEGATIVE

## 2022-08-02 LAB — CBC WITH AUTO DIFFERENTIAL
Absolute Immature Granulocyte: 0 10*3/uL (ref 0.00–0.04)
Basophils %: 0 % (ref 0–1)
Basophils Absolute: 0 10*3/uL (ref 0.0–0.1)
Eosinophils %: 0 % (ref 0–7)
Eosinophils Absolute: 0 10*3/uL (ref 0.0–0.4)
Hematocrit: 42 % (ref 35.0–47.0)
Hemoglobin: 13.9 g/dL (ref 11.5–16.0)
Immature Granulocytes: 0 % (ref 0.0–0.5)
Lymphocytes %: 18 % (ref 12–49)
Lymphocytes Absolute: 2.1 10*3/uL (ref 0.8–3.5)
MCH: 31.8 PG (ref 26.0–34.0)
MCHC: 33.1 g/dL (ref 30.0–36.5)
MCV: 96.1 FL (ref 80.0–99.0)
MPV: 9.5 FL (ref 8.9–12.9)
Monocytes %: 8 % (ref 5–13)
Monocytes Absolute: 0.9 10*3/uL (ref 0.0–1.0)
Neutrophils %: 74 % (ref 32–75)
Neutrophils Absolute: 8.5 10*3/uL — ABNORMAL HIGH (ref 1.8–8.0)
Nucleated RBCs: 0 PER 100 WBC
Platelets: 178 10*3/uL (ref 150–400)
RBC: 4.37 M/uL (ref 3.80–5.20)
RDW: 12.5 % (ref 11.5–14.5)
WBC: 11.7 10*3/uL — ABNORMAL HIGH (ref 3.6–11.0)
nRBC: 0 10*3/uL (ref 0.00–0.01)

## 2022-08-02 LAB — LIPID PANEL
Chol/HDL Ratio: 2.8 (ref 0.0–5.0)
Cholesterol, Total: 121 MG/DL (ref <200)
HDL: 44 MG/DL
LDL Calculated: 68.4 MG/DL (ref 0–100)
Triglycerides: 43 MG/DL (ref <150)
VLDL Cholesterol Calculated: 8.6 MG/DL

## 2022-08-02 LAB — EKG 12-LEAD
Atrial Rate: 73 {beats}/min
Diagnosis: NORMAL
P Axis: 67 degrees
P-R Interval: 158 ms
Q-T Interval: 398 ms
QRS Duration: 68 ms
QTc Calculation (Bazett): 438 ms
R Axis: -6 degrees
T Axis: 44 degrees
Ventricular Rate: 73 {beats}/min

## 2022-08-02 LAB — MAGNESIUM: Magnesium: 2.4 mg/dL (ref 1.6–2.4)

## 2022-08-02 LAB — POCT GLUCOSE: POC Glucose: 146 mg/dL — ABNORMAL HIGH (ref 65–117)

## 2022-08-02 LAB — EXTRA TUBES HOLD

## 2022-08-02 LAB — TSH: TSH, 3RD GENERATION: 0.71 u[IU]/mL (ref 0.36–3.74)

## 2022-08-02 LAB — URINE CULTURE HOLD SAMPLE

## 2022-08-02 LAB — PROTIME-INR
INR: 1 (ref 0.9–1.1)
Protime: 10.4 s (ref 9.0–11.1)

## 2022-08-02 LAB — TROPONIN: Troponin, High Sensitivity: 6 ng/L (ref 0–51)

## 2022-08-02 LAB — HEMOGLOBIN A1C
Hemoglobin A1C: 5.8 % — ABNORMAL HIGH (ref 4.0–5.6)
eAG: 120 mg/dL

## 2022-08-02 LAB — PHOSPHORUS: Phosphorus: 1.8 MG/DL — ABNORMAL LOW (ref 2.6–4.7)

## 2022-08-02 LAB — OSMOLALITY, URINE: Osmolality, Ur: 256 MOSM/kg H2O

## 2022-08-02 LAB — OSMOLALITY, SERUM: Serum Osmolality: 293 mOsm/kg H2O

## 2022-08-02 MED ORDER — LEVETIRACETAM 500 MG/5ML IV SOLN
500 MG/5ML | Freq: Once | INTRAVENOUS | Status: AC
Start: 2022-08-02 — End: 2022-08-02
  Administered 2022-08-02: 20:00:00 1000 mg via INTRAVENOUS

## 2022-08-02 MED ORDER — LEVETIRACETAM 500 MG/5ML IV SOLN
500 MG/5ML | Freq: Two times a day (BID) | INTRAVENOUS | Status: AC
Start: 2022-08-02 — End: 2022-08-02
  Administered 2022-08-02: 10:00:00 500 mg via INTRAVENOUS

## 2022-08-02 MED ORDER — ONDANSETRON HCL 4 MG/2ML IJ SOLN
4 MG/2ML | Freq: Once | INTRAMUSCULAR | Status: AC
Start: 2022-08-02 — End: 2022-08-01
  Administered 2022-08-02: 02:00:00 4 mg via INTRAVENOUS

## 2022-08-02 MED ORDER — DEXTROSE 10 % IV SOLN
10 % | INTRAVENOUS | Status: AC | PRN
Start: 2022-08-02 — End: 2022-08-09

## 2022-08-02 MED ORDER — DEXTROSE 10 % IV BOLUS
INTRAVENOUS | Status: AC | PRN
Start: 2022-08-02 — End: 2022-08-09

## 2022-08-02 MED ORDER — DEXAMETHASONE SODIUM PHOSPHATE 4 MG/ML IJ SOLN
4 MG/ML | Freq: Four times a day (QID) | INTRAMUSCULAR | Status: AC
Start: 2022-08-02 — End: 2022-08-02
  Administered 2022-08-02 (×2): 4 mg via INTRAVENOUS

## 2022-08-02 MED ORDER — NORMAL SALINE FLUSH 0.9 % IV SOLN
0.9 % | INTRAVENOUS | Status: AC | PRN
Start: 2022-08-02 — End: 2022-08-09
  Administered 2022-08-02: 07:00:00 10 mL via INTRAVENOUS

## 2022-08-02 MED ORDER — LEVETIRACETAM 500 MG/5ML IV SOLN
500 MG/5ML | Freq: Two times a day (BID) | INTRAVENOUS | Status: AC
Start: 2022-08-02 — End: 2022-08-04
  Administered 2022-08-02 – 2022-08-04 (×4): 750 mg via INTRAVENOUS

## 2022-08-02 MED ORDER — SODIUM CHLORIDE (PF) 0.9 % IJ SOLN
0.9 % | Freq: Two times a day (BID) | INTRAMUSCULAR | Status: AC
Start: 2022-08-02 — End: 2022-08-09
  Administered 2022-08-02 – 2022-08-09 (×15): 20 mg via INTRAVENOUS

## 2022-08-02 MED ORDER — GLUCOSE 4 G PO CHEW
4 g | ORAL | Status: AC | PRN
Start: 2022-08-02 — End: 2022-08-09

## 2022-08-02 MED ORDER — LABETALOL HCL 5 MG/ML IV SOLN
5 MG/ML | Freq: Four times a day (QID) | INTRAVENOUS | Status: AC | PRN
Start: 2022-08-02 — End: 2022-08-09

## 2022-08-02 MED ORDER — LACTATED RINGERS IV SOLN
INTRAVENOUS | Status: AC
Start: 2022-08-02 — End: 2022-08-02
  Administered 2022-08-02: 04:00:00 via INTRAVENOUS

## 2022-08-02 MED ORDER — LEVETIRACETAM 500 MG/5ML IV SOLN
500 MG/5ML | INTRAVENOUS | Status: AC
Start: 2022-08-02 — End: 2022-08-01
  Administered 2022-08-02: 02:00:00 1000 mg via INTRAVENOUS

## 2022-08-02 MED ORDER — K PHOS MONO-SOD PHOS DI & MONO 155-852-130 MG PO TABS
155-852-130 MG | ORAL | Status: AC
Start: 2022-08-02 — End: 2022-08-02
  Administered 2022-08-02 (×2): 1 mg via ORAL

## 2022-08-02 MED ORDER — ONDANSETRON HCL 4 MG/2ML IJ SOLN
4 MG/2ML | Freq: Four times a day (QID) | INTRAMUSCULAR | Status: AC | PRN
Start: 2022-08-02 — End: 2022-08-09
  Administered 2022-08-03 – 2022-08-04 (×2): 4 mg via INTRAVENOUS

## 2022-08-02 MED ORDER — GLUCAGON (RDNA) 1 MG IJ KIT
1 MG | INTRAMUSCULAR | Status: AC | PRN
Start: 2022-08-02 — End: 2022-08-09

## 2022-08-02 MED ORDER — NORMAL SALINE FLUSH 0.9 % IV SOLN
0.9 % | Freq: Two times a day (BID) | INTRAVENOUS | Status: AC
Start: 2022-08-02 — End: 2022-08-09
  Administered 2022-08-03 (×2): 10 mL via INTRAVENOUS
  Administered 2022-08-04: 13:00:00 20 mL via INTRAVENOUS
  Administered 2022-08-05 – 2022-08-09 (×9): 10 mL via INTRAVENOUS

## 2022-08-02 MED ORDER — ACETAMINOPHEN 325 MG PO TABS
325 MG | ORAL | Status: AC
Start: 2022-08-02 — End: 2022-08-01
  Administered 2022-08-02: 02:00:00 650 mg via ORAL

## 2022-08-02 MED ORDER — ACETAMINOPHEN 325 MG PO TABS
325 | ORAL | Status: DC | PRN
Start: 2022-08-02 — End: 2022-08-09
  Administered 2022-08-03 – 2022-08-08 (×5): 650 mg via ORAL

## 2022-08-02 MED ORDER — METOCLOPRAMIDE HCL 5 MG/ML IJ SOLN
5 MG/ML | Freq: Once | INTRAMUSCULAR | Status: AC
Start: 2022-08-02 — End: 2022-08-01
  Administered 2022-08-02: 04:00:00 5 mg via INTRAVENOUS

## 2022-08-02 MED ORDER — INSULIN LISPRO 100 UNIT/ML IJ SOLN
100 UNIT/ML | Freq: Every evening | INTRAMUSCULAR | Status: AC
Start: 2022-08-02 — End: 2022-08-06

## 2022-08-02 MED ORDER — GADOTERIDOL 279.3 MG/ML IV SOLN
279.3 MG/ML | Freq: Once | INTRAVENOUS | Status: AC | PRN
Start: 2022-08-02 — End: 2022-08-02

## 2022-08-02 MED ORDER — GADOTERIDOL 279.3 MG/ML IV SOLN
279.3 MG/ML | INTRAVENOUS | Status: AC
Start: 2022-08-02 — End: 2022-08-02
  Administered 2022-08-02: 07:00:00 10 via INTRAVENOUS

## 2022-08-02 MED ORDER — INSULIN LISPRO 100 UNIT/ML IJ SOLN
100 UNIT/ML | Freq: Three times a day (TID) | INTRAMUSCULAR | Status: AC
Start: 2022-08-02 — End: 2022-08-06

## 2022-08-02 MED ORDER — NORMAL SALINE FLUSH 0.9 % IV SOLN
0.9 % | Freq: Two times a day (BID) | INTRAVENOUS | Status: AC
Start: 2022-08-02 — End: 2022-08-09
  Administered 2022-08-03: 12:00:00 10 mL via INTRAVENOUS
  Administered 2022-08-04: 13:00:00 40 mL via INTRAVENOUS
  Administered 2022-08-05 – 2022-08-09 (×8): 10 mL via INTRAVENOUS

## 2022-08-02 MED ORDER — SODIUM CHLORIDE 0.9 % IV SOLN
0.9 % | INTRAVENOUS | Status: DC
Start: 2022-08-02 — End: 2022-08-05
  Administered 2022-08-02: 08:00:00 via INTRAVENOUS

## 2022-08-02 MED ORDER — VENLAFAXINE HCL 37.5 MG PO TABS
37.5 MG | Freq: Two times a day (BID) | ORAL | Status: AC
Start: 2022-08-02 — End: 2022-08-09
  Administered 2022-08-02 – 2022-08-08 (×13): 37.5 mg via ORAL

## 2022-08-02 MED ORDER — MAGNESIUM SULFATE IN D5W 1-5 GM/100ML-% IV SOLN
1-5 GM/100ML-% | Freq: Once | INTRAVENOUS | Status: AC
Start: 2022-08-02 — End: 2022-08-02
  Administered 2022-08-02: 04:00:00 1000 mg via INTRAVENOUS

## 2022-08-02 MED ORDER — SODIUM CHLORIDE 3 % IV SOLN
3 % | INTRAVENOUS | Status: AC
Start: 2022-08-02 — End: 2022-08-03
  Administered 2022-08-02: 11:00:00 30 mL/h via INTRAVENOUS
  Administered 2022-08-03: 03:00:00 50 mL/h via INTRAVENOUS

## 2022-08-02 MED ORDER — SODIUM CHLORIDE 0.9 % IV SOLN
0.9 % | INTRAVENOUS | Status: AC | PRN
Start: 2022-08-02 — End: 2022-08-09

## 2022-08-02 MED FILL — VIRT-PHOS 250 NEUTRAL 155-852-130 MG PO TABS: 155-852-130 MG | ORAL | Qty: 1

## 2022-08-02 MED FILL — VENLAFAXINE HCL 37.5 MG PO TABS: 37.5 MG | ORAL | Qty: 1

## 2022-08-02 MED FILL — DEXTROSE 10 % IV SOLN: 10 % | INTRAVENOUS | Qty: 1000

## 2022-08-02 MED FILL — SODIUM CHLORIDE 3 % IV SOLN: 3 % | INTRAVENOUS | Qty: 500

## 2022-08-02 MED FILL — NORMAL SALINE FLUSH 0.9 % IV SOLN: 0.9 % | INTRAVENOUS | Qty: 40

## 2022-08-02 MED FILL — FAMOTIDINE (PF) 20 MG/2ML IV SOLN: 20 MG/2ML | INTRAVENOUS | Qty: 2

## 2022-08-02 MED FILL — METOCLOPRAMIDE HCL 5 MG/ML IJ SOLN: 5 MG/ML | INTRAMUSCULAR | Qty: 2

## 2022-08-02 MED FILL — DEXAMETHASONE SODIUM PHOSPHATE 4 MG/ML IJ SOLN: 4 MG/ML | INTRAMUSCULAR | Qty: 1

## 2022-08-02 MED FILL — ONDANSETRON HCL 4 MG/2ML IJ SOLN: 4 MG/2ML | INTRAMUSCULAR | Qty: 2

## 2022-08-02 MED FILL — LEVETIRACETAM 500 MG/5ML IV SOLN: 500 MG/5ML | INTRAVENOUS | Qty: 10

## 2022-08-02 MED FILL — LEVETIRACETAM 500 MG/5ML IV SOLN: 500 MG/5ML | INTRAVENOUS | Qty: 5

## 2022-08-02 MED FILL — MAGNESIUM SULFATE IN D5W 1-5 GM/100ML-% IV SOLN: 1-5 GM/100ML-% | INTRAVENOUS | Qty: 100

## 2022-08-02 MED FILL — MONOJECT FLUSH SYRINGE 0.9 % IV SOLN: 0.9 % | INTRAVENOUS | Qty: 40

## 2022-08-02 MED FILL — SODIUM CHLORIDE 0.9 % IV SOLN: 0.9 % | INTRAVENOUS | Qty: 1000

## 2022-08-02 MED FILL — PROHANCE 279.3 MG/ML IV SOLN: 279.3 MG/ML | INTRAVENOUS | Qty: 10

## 2022-08-02 MED FILL — ACETAMINOPHEN 325 MG PO TABS: 325 MG | ORAL | Qty: 2

## 2022-08-02 MED FILL — LACTATED RINGERS IV SOLN: INTRAVENOUS | Qty: 1000

## 2022-08-02 NOTE — Consults (Signed)
Consult dictated.  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  She sustained a fall about a week ago.  Was at rehab when she reportedly had seizure-like activity/became unresponsive but details not available.  Had CT brain which showed hemorrhage within the area of infarction in the left frontal lobe.  Clinically there are no new deficits and she is close to her baseline.  MRI brain does not show any new areas of ischemia or underlying structural mass lesion.   Etiology of hemorrhagic transformation unclear, potential causes include amyloid angiopathy, hypertension, being on antiplatelets, trauma.   Hold antiplatelets for now.  Keppra for seizure prophylaxis.  PT/OT.  Wells Guiles, MD

## 2022-08-02 NOTE — Progress Notes (Signed)
Spiritual Care Assessment/Progress Note  [facility]    Name: Kelsey Mullins MRN: [id number]    Age: 66 y.o.     Sex: female   Language: English     Date: 08/02/2022            Total Time Calculated: 24 min              Spiritual Assessment begun in [place] Provided For:: Patient and family together     Encounter Overview/Reason : Initial Encounter    Spiritual beliefs:      [x]  Involved in a faith tradition/spiritual practice: Christian     []  Supported by a faith community:      []  Claims no spiritual orientation:      []  Seeking spiritual identity:           []  Adheres to an individual form of spirituality:      []  Not able to assess:                Identified resources for coping and support system:   Support System: Spouse, Children       [x]  Prayer                  []  Devotional reading               []  Music                  []  Guided Imagery     []  Pet visits                                        []  Other: (COMMENT)     Specific area/focus of visit   Encounter:    Crisis:    Spiritual/Emotional needs: Type: Spiritual Support  Ritual, Rites and Sacraments:    Grief, Loss, and Adjustments:    Ethics/Mediation:    Behavioral Health:    Palliative Care:    Advance Care Planning:           Narrative:   Met with Kelsey Mullins along with her husband who was in the room and along with their daughter. Patient shared that she is frustrated with not knowing what the situation is. Her husband shared that there seems to be a lot of waiting for test results. This situation has been difficult as one thing has lead to another and they were hoping she could return home after physical therapy, but her situation changed. I provided listening presence, compassionate understanding and prayer.     [place].  PRN [employer] paging service [phone]

## 2022-08-02 NOTE — Progress Notes (Signed)
I participated in the IDT meeting where the plan of care for Kelsey Mullins was discussed. I reviewed the patient's medical record prior to this meeting.    We will continue to follow for spiritual/emotional care. Please contact chaplain paging service for any immediate needs.      _____________________________  Jacque Cynia Abruzzo, M.Div., M.S., B.C.C.  Staff Chaplain

## 2022-08-02 NOTE — Plan of Care (Signed)
Problem: Physical Therapy - Adult  Goal: By Discharge: Performs mobility at highest level of function for planned discharge setting.  See evaluation for individualized goals.  Description: FUNCTIONAL STATUS PRIOR TO ADMISSION: pt was admitted from [facility] and was ambulating with a RW and gait belt and completed stair and family training. Pt was preparing to d/c home with 24 hour assistance, but was admitted to [facility] after seizure and new ICH    HOME SUPPORT PRIOR TO ADMISSION: The patient lived with spouse and has supportive daughter. Daughter reported the plan was to stay with the daughter during the day time and then spouse at night. .    Physical Therapy Goals  Initiated 08/02/2022  1.  Patient will move from supine to sit and sit to supine and roll side to side in bed with supervision/set-up within 7 day(s).    2.  Patient will perform sit to stand with supervision/set-up within 7 day(s).  3.  Patient will transfer from bed to chair and chair to bed with supervision/set-up using the least restrictive device within 7 day(s).  4.  Patient will ambulate with CGA for 75 feet with the least restrictive device within 7 day(s).   5.  Patient will ascend/descend 4 stairs with bilateral handrail(s) with minimal assistance within 7 day(s).  6.  Patient will improve Berg Balance score by 7 points within 7 days.    Outcome: Progressing   PHYSICAL THERAPY EVALUATION    Patient: Kelsey Mullins (66 y.o. female)  Date: 08/02/2022  Primary Diagnosis: Intracranial hemorrhage (HCC) [I62.9]  Intraparenchymal hematoma of brain, left, with loss of consciousness of 30 minutes or less, initial encounter [facility]) [O53.664Q]       Precautions: Fall Risk (SBP <140)                    ASSESSMENT :   DEFICITS/IMPAIRMENTS:   The patient is limited by decreased functional mobility, ROM, strength, sensation, safety awareness, cognition, command following, balance, gait, impaired speech, verbal perseveration, s/p admission on 8/16  after a seizure at IPR and found to have an acute L frontal ICH     Based on the impairments listed above pt demonstrated decreased RUE and RLE strength (4/5 R hip flexion) and impaired sensation RUE. Pt benefits from simple, direct cues and visual cueing as well. Pt completed transfers with RW with CGA and gait training x 15 ft with RW and intermittent min A. Pt with posterior leaning in standing especially when both hands are removed from the RW. Pt remains at increased risk for falls and will benefit from inpatient rehab upon discharge to continue therapy efforts and reach highest level of independence.     Patient will benefit from skilled intervention to address the above impairments.    Functional Outcome Measure:  The patient scored 10 on the berg balance outcome measure which is indicative of high risk for falls.           PLAN :  Recommendations and Planned Interventions:   bed mobility training, transfer training, gait training, therapeutic exercises, neuromuscular re-education, patient and family training/education, and therapeutic activities    Frequency/Duration: Patient will be followed by physical therapy to address goals, PT Plan of Care: 5 times/week to address goals.    Recommendation for discharge: (in order for the patient to meet his/her long term goals): Therapy 3 hours/day 5-7 days/week    Other factors to consider for discharge: impaired cognition, high risk for falls, and concern for  safely navigating or managing the home environment    IF patient discharges home will need the following DME: patient owns DME required for discharge       SUBJECTIVE:   Patient stated "November 30th."  pt repeated this date for remaining orientation questions    OBJECTIVE DATA SUMMARY:       No past medical history on file.No past surgical history on file.    Home Situation:  Social/Functional History  Lives With: Spouse  Type of Home: House  Home Layout: One level (4 STE daughter's house with bil rails)  Home  Access: Ramped entrance  Bathroom Shower/Tub: Psychologist, counselling, Paediatric nurse with back  Bathroom Toilet: Standard  Bathroom Equipment: Grab bars in shower  Home Equipment: Rollator  Receives Help From: Family  ADL Assistance: Needs assistance  Bath: Supervision  Dressing: Supervision  Grooming: Supervision  Feeding: Supervision  Toileting: Needs assistance  Ambulation Assistance: Independent (RW)  Transfer Assistance: Independent (RW)  Active Driver: No  Mode of Transportation: Car  Type of Occupation: Works at [employer] Status:  Orientation  Overall Orientation Status: Impaired  Orientation Level: Disoriented to time;Oriented to place;Disoriented to person;Disoriented to situation (able to correctly indicate place & birth month with options)  Cognition  Overall Cognitive Status: Exceptions  Arousal/Alertness: Appropriate responses to stimuli;Delayed responses to stimuli  Following Commands: Follows one step commands with increased time;Follows one step commands with repetition;Inconsistently follows commands  Attention Span: Attends with cues to redirect;Difficulty dividing attention  Memory: Decreased recall of biographical Information;Decreased recall of precautions;Decreased short term memory;Decreased long term memory;Decreased recall of recent events  Safety Judgement: Decreased awareness of need for assistance;Decreased awareness of need for safety  Problem Solving: Assistance required to generate solutions;Assistance required to implement solutions;Assistance required to identify errors made;Assistance required to correct errors made;Decreased awareness of errors  Insights: Decreased awareness of deficits  Initiation: Requires cues for some  Sequencing: Requires cues for all           Strength:    Strength: Generally decreased, functional (except R hip flexion 4/5)    Tone & Sensation:   Tone: Normal  Sensation: Impaired (RUE decreased to light touch)    Coordination:       Range Of  Motion:  AROM: Within functional limits       Functional Mobility:  Bed Mobility:     Bed Mobility Training  Bed Mobility Training: Yes  Supine to Sit: Contact-guard assistance  Transfers:   CGA with RW     Balance:               Balance  Sitting: Impaired  Sitting - Static: Good (unsupported)  Sitting - Dynamic: Fair (occasional)  Standing: Impaired  Standing - Static: Good  Standing - Dynamic: Fair  Ambulation/Gait Training:                       Gait  Overall Level of Assistance: Contact-guard assistance--min A  Base of Support: Narrowed  Speed/Cadence: Shuffled  Step Length: Right shortened;Left shortened  Swing Pattern: Right asymmetrical  Gait Abnormalities: Trunk sway increased;Path deviations  Distance (ft): 15 Feet  Assistive Device: Gait belt;Walker, rolling  Berg Balance Test:    Sharlene Motts Balance Scale  1. Sitting to Standing: Needs minimal aid to stand or stabilize  2. Standing Unsupported: Unable to stand 30 seconds unsupported  3. Sitting with Back Unsupported but Feet Supported on Floor or on a Stool: Able to sit safely and securely for 2 minutes  4. Standing to Sitting: Uses back of legs against chair to control descent  5.  Transfers: Needs one person to assist  6. Standing Unsupported with Eyes Closed: Needs help to keep from falling  7. Standing Unsupported with Feet Together: Needs help to attain position and unable to hold for 15 seconds  8. Reach Forward with Outstretched Arm While Standing: Can reach forward 5 cm (2 inches)  9. Pick Up Object from Floor from a Standing Position: Unable to try/needs assist to keep from losing balance or falling  10. Turning to Look Behind Over Left and Right Shoulders While Standing: Needs assist to keep from losing balance or falling  11. Turn 360 Degrees: Needs  assistance while turning  12. Place Alternate Foot on Step or Stool While Standing Unsupported: Needs assistance to keep from falling/unable to try  13. Standing Unsupported One Foot in Front: Loses balance while stepping or standing  14. Standing on One Leg: Unable to try needs assist to prevent fall  Berg Balance Score: 10         56=Maximum possible score;   0-20=High fall risk  21-40=Moderate fall risk   41-56=Low fall risk                                                                                                                                                                                                                               Pain Rating:  0/10   Pain Intervention(s):       Activity Tolerance:   Good and Fair     After treatment:   Patient left in no apparent distress sitting up in chair, Call bell within reach, Bed/ chair alarm activated, Caregiver / family present, and Side rails x3    COMMUNICATION/EDUCATION:   The patient's plan of care was discussed with: occupational therapist and registered nurse    Patient Education  Education Given To: Patient;Family  Education Provided: Plan of Care;Role of Therapy  Education Method: Verbal  Barriers to Learning: None  Education Outcome: Continued education needed    Thank you for this referral.  Consuello Bossier, PT,  DPT  Minutes: 30

## 2022-08-02 NOTE — Progress Notes (Addendum)
Speech LAnguage Pathology EVALUATION    Patient: Kelsey Mullins (67 y.o. female)  Date: 08/02/2022  Primary Diagnosis: Intracranial hemorrhage (HCC) [I62.9]  Intraparenchymal hematoma of brain, left, with loss of consciousness of 30 minutes or less, initial encounter (HCC) [Y40.347Q]       Precautions:  Fall Risk (SBP <140)                  ASSESSMENT :  Based on the objective data described below, the patient presents with functional oropharyngeal phases of swallow consistent with her recent MBS.  Discussed pt with family who states that she had the MBS and was initiating on minced/moist diet and thin liquids. As recently as this Tuesday, was advanced to regular and was tolerating well. Family recalls strategies of cueing patient to clear throat sporadically and wash food down with liquid indicated that there may be some known pharyngeal residue but that pt utilizes compensatory strategies. Therefore, recommend pt reinitiate regular diet/thin liquids with her compensatory strategies. Will follow-up for tolerance.     Additionally, pt presents with moderate expressive-receptive aphasia. Expressive language deficits characterized by difficulty with perseveration in automatic speech, difficulty with complex naming tasks, but with repetition intact. Receptive language deficits characterized by difficulty beyond simple yes/no questions (ie is your name Taylen?). 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 Recommend continued SLP at this and the next level of care.     Patient will benefit from skilled intervention to address the above impairments.     PLAN :  Recommendations and Planned Interventions:  Diet: Regular and thin liquids  Good oral care  Upright during meals  Intermittent throat clear cued  Alternate liquids/solids     Acute SLP Services: Yes, patient will be followed by speech-language pathology 3x/week to address goals. Patient's rehabilitation potential is considered  to be Good.    Discharge Recommendations: Yes, recommend SLP treatment at next level of care     SUBJECTIVE:   Patient stated, "Thank you."    OBJECTIVE:   No past medical history on file.No past surgical history on file.  Prior Level of Function/Home Situation:   Social/Functional History  Lives With: Spouse  Type of Home: House  Home Layout: One level (4 STE daughter's house with bil rails)  Home Access: Ramped entrance  Bathroom Shower/Tub: Psychologist, counselling, Paediatric nurse with back  Firefighter: Midwife: Grab bars in shower  Home Equipment: Rollator  Receives Help From: Family  ADL Assistance: Needs assistance  Bath: Supervision  Dressing: Supervision  Grooming: Supervision  Feeding: Supervision  Toileting: Needs assistance  Ambulation Assistance: Independent (RW)  Transfer Assistance: Independent (RW)  Active Driver: No  Mode of Transportation: Car  Type of Occupation: Works at [employer] and Communication Status:  Neurologic State: Alert  Orientation Level: Oriented to person  Cognition: Decreased command following    Dysphagia:    P.O. Trials:  PO Trials  Vocal Quality: No Impairment  Consistency Presented: Thin;Regular  How Presented: Self-fed/presented;Straw  Bolus Acceptance: No impairment  Bolus Formation/Control: No impairment  Propulsion: No impairment  Initiation of Swallow: No impairment  Laryngeal Elevation: Functional  Aspiration Signs/Symptoms: None  Pharyngeal Phase Characteristics: No impairment, issues, or problems     Pharyngeal Phase   Pharyngeal Phase: WFL      Language Comprehension and Expression:  Auditory Comprehension  Comprehension: Exceptions  Simple yes/no questions: Mild  Moderate yes/no  questions: Severe  Complex yes/no questions: Severe  Basic Questions: Mild  One Step Commands: Mild  Two Step Commands: Severe     Expression  Primary Mode of Expression: Verbal  Verbal Expression  Verbal Expression: Exceptions to functional  limits  Initiation: Moderate  Repetition: WFL  Automatic Speech: Moderate  Confrontation: Mild  Conversation: Moderate              Respiratory Status/Airway:  Room air                                 After treatment:   Call bell left within reach and Nursing notified    COMMUNICATION/EDUCATION:       The patient's plan of care including recommendations, planned interventions, and recommended diet changes were discussed with: Registered nurse    Patient/family have participated as able in goal setting and plan of care    Thank you,  Shelle Iron, SLP           Problem: SLP Adult - Impaired Swallowing  Goal: By Discharge: Advance to least restrictive diet without signs or symptoms of aspiration for planned discharge setting.  See evaluation for individualized goals.  Description: Speech Therapy Goals  Initiated 08/02/22    1. Patient will tolerate baseline diet without adverse effects within 7 days.   2. Patient will participate in automatic speech tasks with 50% accuracy independently within 7 days.   3. Patient will participate in word retrieval tasks with 50% accuracy independently within 7 days.   4. Patient will participate in simple-moderate receptive language tasks with 60% accuracy within 7 days.   Outcome: Progressing

## 2022-08-02 NOTE — Care Coordination-Inpatient (Signed)
Care Management Initial Assessment       RUR: 10%, Low   Readmission? No     08/02/22 0835   Service Assessment   Patient Orientation Alert and Oriented   Cognition Alert   History Provided By Child/Family  Kelsey Mullins [phone], Daughter Kelsey Mullins [phone].)   Primary Caregiver Self   Support Systems Spouse/Significant Other;Children   Patient's Healthcare Decision Maker is: Legal Next of [place]  Kelsey Mullins [phone])   PCP Verified by CM Yes  (Dr Delphina Cahill)   Last Visit to PCP Within last 6 months   Prior Functional Level Assistance with the following:;Bathing   Current Functional Level Assistance with the following:;Bathing;Dressing;Toileting;Mobility   Can patient return to prior living arrangement Unknown at present   Ability to make needs known: Fair   Family able to assist with home care needs: Yes   Would you like for me to discuss the discharge plan with any other family members/significant others, and if so, who? Yes  (Spouse)   Financial Resources None   Community Resources None   Social/Functional History   Lives With Spouse   Type of [place] One level   Home Access Ramped entrance   [place] Needs assistance   [place]  (Walking with a walker)   Transfer Assistance Needs assistance   Active Driver No   Mode of Transportation Car   Type of Occupation Works at [employer] Other   Patient expects to be discharged to: Acute rehab   One/Two Story Residence One story   History of falls? 0     Patient resented to the ED from [place] after being found unresponsive. Head CT: New Left Frontal Hemorrhage.Patient has a history of large left frontal  intraparenchymal hemorrhage with extension into the ventricles in July and was discharged to [facility]. Per family patient was to be discharged today.  Care manager met with patient son Kelsey Mullins [phone] and daughter Kelsey Mullins [phone] to introduce self and explain role. Prior to July patient was independent no DME/HH or IPR needs. Son confirmed her PCP to be Dr Delphina Cahill and she sees him every 6 months and uses the [facility] in [place]. Per patient she was ambulating with a walker at rehab. She is employed by [employer] and has [facility], Daughter does not have her insurance card.  Plan will be for patient to return to [facility] if appropriate.   Jesse Fall RN Care Management

## 2022-08-02 NOTE — Consults (Signed)
[name]  CONSULTATION    Name:  Kelsey Mullins, Kelsey Mullins  MR#:  [id number]  DOB:  [birth year]  ACCOUNT #:  [id number]  DATE OF SERVICE:  08/02/2022      REQUESTING PHYSICIAN:  Lanny Cramp, NP    REASON FOR EVALUATION:  Seizure-like activity, intracranial hemorrhage.    HISTORY OF PRESENT ILLNESS:  The patient is a 66 year old female, who had a stroke in the left frontal area back in July when she was admitted for cardiac catheterization.  This occurred even before she had the cardiac cath and no clear etiology was found.  She was put on aspirin and statin which she had difficulty with the statin and this was discontinued and alternative medication was started.  She was in rehab and making good progress and was about to be discharged with a walker.  She has expressive aphasia.  She had an event where she became unresponsive and apparently was staring.  A seizure was suspected and had a CT brain, which showed hemorrhage within the area of infarction in the left frontal lobe.  She was transferred over to [facility] for further management.  She did get a CTA of the head and neck, which did not show any aneurysm, AVM, occlusion, or dissection.  Clinically, she is close to her baseline as per daughter, and there is no significant change in her strength or speech/language processing.    PAST MEDICAL HISTORY:  As mentioned above.    HOME MEDICATIONS:  Not known.    ALLERGIES:  CRESTOR AND LIPITOR.    FAMILY HISTORY:  Noncontributory.    SOCIAL HISTORY:  No history of smoking or alcohol use.    PHYSICAL EXAMINATION:  VITAL SIGNS:  Blood pressure 124/77, pulse 88, and temperature 98.3.  NEUROLOGIC:  On examination, the patient is alert, able to answer simple questions.  Knows she is in the hospital and recognizes her daughter.  She has intermittent difficulty finding her words or expressing herself fluently.  She understands well and can repeat.  Pupils were equal, round, and reactive.  Extraocular  movements were full.  Face is symmetric.  Tongue is midline.  Hearing is baseline.  Muscle tone and bulk normal.  Strength appears normal in all extremities.  DTRs are 2/2 and symmetric.  Toes upgoing on the right.  Uses assistance/walker for ambulation.  HEART:  Regular rate and rhythm.  CHEST:  Clear.  ABDOMEN:  Soft and nontender.  Positive bowel sounds.  EXTREMITIES:  No edema.    LABORATORY AND DIAGNOSTIC DATA:  Chemistries:  Sodium 135, potassium 4.1, BUN 9, and creatinine 0.54.  Lipid Profile:  Total cholesterol 121, HDL 44, LDL 68.4, and triglycerides 43.  LFTs are unremarkable.  CT brain shows acute intraparenchymal hemorrhage in the left frontal lobe with surrounding cystic change/encephalomalacia, overall measuring 5 x 3 x 5 cm.  There is a 6 mm of rightward midline shift.  MRI scan of the brain shows left frontal intraparenchymal hemorrhage with slight increase in size when compared to the prior study.  Midline shift is unchanged.    ASSESSMENT AND PLAN:  This is a 66 year old female, who had a left frontal stroke in July of this year with residual expressive aphasia and ataxia.  She sustained a fall about a week ago while at rehab.  Yesterday, she reportedly had seizure-like activity where she became unresponsive and was staring.  She had a CT brain, which showed hemorrhage within the area of infarction in the left  frontal lobe.  Clinically, there are no new deficits and she is close to her baseline.  MRI of the brain does not show any new areas of ischemia or underlying structural mass lesions.    Etiology of hemorrhagic transformation is unclear.  Potential causes include amyloid angiopathy with hypertension, being on anti-platelets or trauma.  Hold anti-platelets for now.  Maintain Keppra for seizure prophylaxis.  Repeat imaging tomorrow morning and neuro checks q. 2 hours for today.    We will follow.  Thank you for this consultation.      Lance Bosch, MD      [id number]  D:   08/02/2022 12:18  T:  08/02/2022 12:40  JOB #:  [id number]

## 2022-08-02 NOTE — Progress Notes (Signed)
PICC order noted, 3% saline running at 30cc/hr. RN to let us know if patient will be needing central access.

## 2022-08-02 NOTE — Progress Notes (Signed)
1930: Bedside shift change report given to Lanora Manis  (Cabin crew) by Dahlia Client  (offgoing nurse). Report included the following information Nurse Handoff Report, Index, Intake/Output, and MAR.

## 2022-08-02 NOTE — Plan of Care (Signed)
Problem: Occupational Therapy - Adult  Goal: By Discharge: Performs self-care activities at highest level of function for planned discharge setting.  See evaluation for individualized goals.  Description: FUNCTIONAL STATUS PRIOR TO ADMISSION:  Patient was highly IND at baseline, however recently suffered a L frontal CVA, d/c to IPR where she was set to d/c home with SPV and rollator use with 24/7 family assist/SPV, mild RUE/RLE weakness and aphasia.      HOME SUPPORT: Patient lived with her husband, was planning to d/c home with [facility] therapy, was going to stay with her daughter during the day while her husband is at work.     Occupational Therapy Goals  Initiated 08/02/2022   1.  Patient will perform grooming at the sink with Supervision within 7 day(s).  2.  Patient will perform bathing with Supervision within 7 day(s).  3.  Patient will perform upper and lower body dressing with Supervision within 7 day(s).  4.  Patient will perform toilet transfers with Supervision within 7 day(s).  5.  Patient will perform all aspects of toileting with Supervision within 7 day(s).  6.  Patient will participate in upper extremity therapeutic exercise/activities with Supervision within 7 day(s).    7.  Patient will utilize energy conservation techniques during functional activities with verbal and visual cues within 7 day(s).  8.  Patient will complete the Fugl Meyer within 7 days.    Outcome: Progressing     OCCUPATIONAL THERAPY EVALUATION    Patient: Kelsey Mullins (66 y.o. female)  Date: 08/02/2022  Primary Diagnosis: Intracranial hemorrhage (HCC) [I62.9]  Intraparenchymal hematoma of brain, left, with loss of consciousness of 30 minutes or less, initial encounter (HCC) [S56.812X]         Precautions: Fall Risk (SBP <140)                  ASSESSMENT :  The patient is limited by decreased functional mobility, independence in ADLs, high-level IADLs, ROM, strength, body mechanics, activity tolerance, safety awareness, cognition,  command following, attention/concentration, coordination, balance, vision/visual deficit, and attention to the R s/p admission for seizure with worsened aphasia & weakness, 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 She was admitted from IPR with plans to d/c home with rollator use and family SPV, was highly IND prior to her first CVA (pending cardiac testing).     She now remains below her baseline, requiring Min-Mod A for ADLs and CGA-Min A for OOB mobility. She requires some increased time for processing with very simple 1, step cues, visual aid/assist occasionally helpful for carryover. Completed grooming at the sink with Mod A d/t A for task completion (sequencing, initiation, and termination), posterior LOB with 0-1 BUE support. Returned to the chair with decreased RUE function noted, unable to complete Google d/t cognitive deficits however strength, ROM, and coordination diminished (family at bedside reporting RUE RTC injury). Of note, with visual scanning & functional tasks, patient with decreased attention to the R however very mild nystagmus noted with horizontal tracking to all L VFs. Given her decline in function, recommend d/c back to IPR to maximize safety & functional independence.     Functional Outcome Measure:  The patient scored 13/24 on the AM-PAC outcome measure which is indicative of higher odds of rehab needs upon d/c.         PLAN :  Recommendations and Planned Interventions:   self care training, therapeutic activities, functional mobility  training, balance training, therapeutic exercise, neuromuscular re-education, visual/perceptual training, endurance activities, cognitive retraining, patient education, home safety training, and family training/education    Frequency/Duration: OT Plan of Care: 5 times/week    Recommendation for discharge: (in order for the patient to meet his/her long term goals): Therapy 3 hours/day 5-7 days/week    Other  factors to consider for discharge: patient's current support system is unable to meet their requirements for physical assistance, poor safety awareness, impaired cognition, high risk for falls, not safe to be alone, concern for safely navigating or managing the home environment, and acute neuro deficits    IF patient discharges home will need the following DME: continuing to assess with progress       SUBJECTIVE:   Patient stated, "[birth year] perseverative on incorrect birth date, able to select correct month with 2 options but unable to successfully state birthday    OBJECTIVE DATA SUMMARY:   No past medical history on file.No past surgical history on file.       Expanded or extensive additional review of patient history:   Lives With: Spouse  Type of Home: House  Home Layout: One level (4 STE daughter's house with bil rails)  Home Access: Ramped entrance        Bathroom Shower/Tub: Psychologist, counselling, Paediatric nurse with back  Armed forces technical officer: Grab bars in shower     Home Equipment: Rollator           Hand Dominance: right     EXAMINATION OF PERFORMANCE DEFICITS:    Cognitive/Behavioral Status:  Orientation  Overall Orientation Status: Impaired  Orientation Level: Disoriented to time;Oriented to place;Disoriented to person;Disoriented to situation (able to correctly indicate place & birth month with options)  Cognition  Overall Cognitive Status: Exceptions  Arousal/Alertness: Appropriate responses to stimuli;Delayed responses to stimuli  Following Commands: Follows one step commands with increased time;Follows one step commands with repetition;Inconsistently follows commands  Attention Span: Attends with cues to redirect;Difficulty dividing attention  Memory: Decreased recall of biographical Information;Decreased recall of precautions;Decreased short term memory;Decreased long term memory;Decreased recall of recent events  Safety Judgement: Decreased awareness of need for  assistance;Decreased awareness of need for safety  Problem Solving: Assistance required to generate solutions;Assistance required to implement solutions;Assistance required to identify errors made;Assistance required to correct errors made;Decreased awareness of errors  Insights: Decreased awareness of deficits  Initiation: Requires cues for some  Sequencing: Requires cues for all    Skin: Appears intact    Edema: None    Hearing:        Vision/Perceptual:    Vision - Basic Assessment  Prior Vision: Wears glasses all the time  Visual Field Cut: No  Oculo Motor Control: Impaired  Impairments: Scanning;Nystagmus Present (very slight nystagmus with tracking to all L VFs)        Perception  Overall Perceptual Status: Impaired  Unilateral Attention: Cues to maintain midline in standing;Cues to attend to right side of body;Cues to attend right visual field  Initiation: Cues to initiate tasks  Motor Planning: Cues to use objects appropriately  Perseveration: Perseverates during conversation    Range of Motion:   AROM: Generally decreased, functional (RUE/RLE)  PROM: Within functional limits      Strength:  Strength: Generally decreased, functional (RUE/RLE)      Coordination:  Coordination:  (RUE/RLE)            Tone & Sensation:      Sensation:  (unable to assess d/t inconsistency  with answers despite multiple methods/cues)          Functional Mobility and Transfers for ADLs:    Bed Mobility:     Bed Mobility Training  Bed Mobility Training: Yes  Supine to Sit: Contact-guard assistance    Transfers:     Art therapist: Yes  Overall Level of Assistance: Contact-guard assistance  Sit to Stand: Contact-guard assistance  Stand to Sit: Contact-guard assistance  Toilet Transfer: Minimum assistance (Min A for bathroom mobility with intermittent posterior lean)            Balance:  Standing: Impaired  Balance  Sitting: Impaired  Sitting - Static: Good (unsupported)  Sitting - Dynamic: Fair  (occasional)  Standing: Impaired  Standing - Static: Good  Standing - Dynamic: Fair;Constant support        ADL Assessment:          Feeding: Minimal assistance       Grooming: Moderate assistance  Grooming Skilled Clinical Factors: standing at the sink, Min A for balance d/t posterior lean, mod multimodal cues for seuqening, initiation, and termination    UE Bathing: Moderate assistance       LE Bathing: Moderate assistance       UE Dressing: Moderate assistance       LE Dressing: Moderate assistance       Toileting: Moderate assistance              ADL Intervention and task modifications:      Intervention/Education specific to: "Stroke diagnoses"    Patient was educated regarding her deficit(s) above as this relates to her diagnosis of CVA.  She demonstrated  poor understanding as evidenced by verbal discussion, lack of carryover d/t cognition, unable to complete Google.        [employer] AM-PACTM "6 Clicks"                                                       Daily Activity Inpatient Short Form  How much help from another person does the patient currently need... Total; A Lot A Little None   1.  Putting on and taking off regular lower body clothing? []   1 [x]   2 []   3 []   4   2.  Bathing (including washing, rinsing, drying)? []   1 [x]   2 []   3 []   4   3.  Toileting, which includes using toilet, bedpan or urinal? []  1 [x]   2 []   3 []   4   4.  Putting on and taking off regular upper body clothing? []   1 [x]   2 []   3 []   4   5.  Taking care of personal grooming such as brushing teeth? []   1 [x]   2 []   3 []   4   6.  Eating meals? []   1 []   2 [x]   3 []   4    2007, Trustees of , under license to [employer] [place]. All rights reserved     Score: 13/24     Interpretation of Tool:  Represents clinically-significant functional categories (i.e. Activities of daily living).    Cutoff score 39.4 (19) correlates to a good likelihood of discharging home versus a facility  Diane U. , ,  , . [place],  Thornton Dales. Cassandria Anger, AM-PAC "6-Clicks" Functional Assessment Scores Predict Acute Care Hospital Discharge Destination, Physical Therapy, Volume 94, Issue 9, 17 August 2013, Pages [phone], [email or web address]    Pain Rating:  None/10   Pain Intervention(s):   nursing notified    Activity Tolerance:   Good    After treatment:   Patient left in no apparent distress sitting up in chair, Call bell within reach, Bed/ chair alarm activated, and Caregiver / family present    COMMUNICATION/EDUCATION:   The patient's plan of care was discussed with: physical therapist and registered nurse    Patient Education  Education Given To: Patient  Education Provided: Role of Therapy;Plan of Care;Precautions;ADL Adaptive Strategies;Transfer Training;Energy Conservation;Orientation;Family Education;Fall Prevention Strategies  Education Method: Demonstration;Verbal;Teach Back  Barriers to Learning: Cognition  Education Outcome: Unable to demonstrate understanding;Continued education needed    Thank you for this referral.  Daine Floras, OTD, OTR/L  Minutes: 31    Occupational Therapy Evaluation Charge Determination   History Examination Decision-Making   LOW Complexity : Brief history review  MEDIUM Complexity: 3-5 Performance deficits relating to physical, cognitive, or psychosocial skills that result in activity limitations and/or participation restrictions MEDIUM Complexity: Patient may present with comorbidities that affect occupational performance. Minimal to moderate modifications of tasks or assist (eg. physical or verbal) with assist is necessary to enable pt to complete eval   Based on the above components, the patient evaluation is determined to be of the following complexity level: Low

## 2022-08-02 NOTE — Progress Notes (Addendum)
0730: Bedside and Verbal shift change report given to H. Hope Budds, RN (Cabin crew) by Moise Boring, RN (offgoing nurse). Report included the following information Nurse Handoff Report, Adult Overview, Intake/Output, MAR, Recent Results, Med Rec Status, Cardiac Rhythm NSR, Alarm Parameters, and Neuro Assessment.     Drips verified upon the start of the shift:   -3% NS @ 30 ml/hr    1335: Lab called d/t unresulted BMP. Supposed to be running lab at this time.    1345: Patient noted to be shaking abruptly with eyes closed. Daughter grabbed this Charity fundraiser. Patient quickly and easily awoke when the patient's name was called and shaking ceased. RN notified NP Almira Coaster. Temperature re-taken at this time (98.4).     1500: This RN went in to complete q1h neurological exam. Upon waking patient up, she was noted to be unable to answer orientation questions, even in yes/no format. Patient noted to be almost completely aphasic. Almira Coaster, NP made aware. Orders received for STAT CT.     1512: Patient en route to CT.     1540: Patient returned to room from CT. Almira Coaster, NP at bedside. Patient speaking slightly more, but still not able to complete orientation questions, even in yes/no format. RN gave ordered dose of Keppra and started Cerebell.     1600: This RN sent a message to Dr. Lauree Chandler to inform him of the events of the past hour. Dr. Lauree Chandler to adjust keppra orders.     1700: Patient keeps pulling of Cerebell headband. Per Almira Coaster, NP, okay to keep off for now.     1930: Bedside and Verbal shift change report given to E. Marvetta Gibbons, Charity fundraiser (Cabin crew) by [employer] Hope Budds, RN Physiological scientist). Report included the following information Nurse Handoff Report, Intake/Output, MAR, Recent Results, Med Rec Status, Cardiac Rhythm NSR, and Alarm Parameters.

## 2022-08-02 NOTE — Progress Notes (Deleted)
I participated in the IDT meeting where the plan of care for Kelsey Mullins was discussed. I reviewed the patient's medical record prior to this meeting.    We will continue to follow for spiritual/emotional care. Please contact chaplain paging service for any immediate needs.      _____________________________  Jacobo Forest, M.Div., M.S., B.C.C.  Staff Chaplain

## 2022-08-02 NOTE — Progress Notes (Addendum)
0300: Met ED RN [facility] in MRI suite and received report on patient. Patient on RA satting 94% VSS.      0310: Done with MRI, patient tolerated well, on the way up to ICU    0330: Patient arrived on ICU    0700: Per NP Dellyn patient started on 3%NS gtt    0730: Bedside and Verbal shift change report given to [place] (oncoming nurse) by Margretta Ditty RN (offgoing nurse). Report included the following information Nurse Handoff Report, Adult Overview, Intake/Output, MAR, Recent Results, Cardiac Rhythm NSR, Alarm Parameters, Neuro Assessment, and Event Log.

## 2022-08-02 NOTE — Progress Notes (Signed)
CRITICAL CARE NOTE      Name: Kelsey Mullins   DOB: 13-Nov-1956   MRN: [id number]   Date: 08/02/2022      REASON FOR ICU ADMISSION:  Altered mental status, ?seizure, Intraparenchymal hemorrhage      PRINCIPAL ICU DIAGNOSIS   Unresponsive  ?  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  PMHx significant for CAD (CABG), COPD, GERD, NSTEMI, and recent left frontal intraparenchymal hemorrhage which was diagnosed earlier this year at [facility].  She was initially admitted (7/13) due to chest pain, dizziness and shortness of breath at which time she was treated for an NSTEMI and started on Plavix, aspirin, and enoxaparin.  She subsequently developed impaired speech and right-sided weakness at which time a code stroke was called.  Imaging revealed a large left frontal intraparenchymal hemorrhage with extension into the ventricles with convexity subarachnoid blood redistribution with a 6 mm midline shift to the right on (7/14).  There was no CT evidence of aneurysm, AVM, occlusion or dissection.  At that time patient was admitted to the ICU where neurosurgery was consulted and maintained on nicardipine infusion and 3%.  No surgical intervention was necessary, G-tube placed and discharged to inpatient rehab at [facility].      On (8/16) patient was nearing planned discharge at [facility] facility when she developed altered mental status/unresponsiveness, staring off into space.  Per report physician at the facility tried to sternal rub patient without.  Episode spontaneously resolved.  She was then transported to the emergency department for further observation.  While in route to the emergency department per EMS patient began  to have projectile vomiting.  On arrival to the ED a code stroke was called and a stat NCCT head revealed a new left frontal intracranial hemorrhage.  Neurosurgery was consulted- concerned that she has rebled into the old stroke bed.  She was admitted to the ICU for further observation and management.    COMPREHENSIVE ASSESSMENT & PLAN:SYSTEM BASED     24 HOUR EVENTS: Overnight MRI with and without contrast was obtained revealing left frontal intraparenchymal hemorrhage and slight increase of size from prior study.  There was no change in left to right midline shift.  No definitive enhancement noted throughout evaluation.  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)   -NSGY and neurology consulted  -LKN approximally 1800 8/16  -NIHSS 3  -HOB > 30 degrees, aspiration reactions  -Goal SBP < 140 mmHg, PRN labetalol  -Continue keppra 500 mg Q 12 hours  -Was started on decadron 4 mg Q6 hours, defer to NSGY   -Tx screen negative  -Stroe labs pending  -TTE pending  -Hourly neurologic exam and VS  -Goal Na 150 to 15 mmol/L on 3% gtt w/seral BMPs  -PT/OT/SLP    Neuro Imaging:  8/16 [facility] - Acute intraparenchymal hemorrhage L frontal lobe w/ surrounding  cystic change/encephalomalacia measuring 5.2 x 3.3 x 5.4 cm,w/surrounding  Edema, mass effect > 6 mm right midline shift. Unclear whether acute rebleeding into chronic hematoma versus evolution of a recent acute intraparenchymal hemorrhage as given in  clinical  history.  8/17 MRI W [facility] - Left frontal intraparenchymal hemorrhage w/L>R midline shift unchanged. No definite enhancement noted.     PULMONOLOGY:  Hx COPD   -Goal oxygen saturation > 92% titrate NC as indicated  -HOB > 30 degrees  -PRN bronchodilator as indicated    CARDIOVASCULAR:  Hx CAD, NSTEMI, CABG   -Goal SBP < 140 mmHg, PRN labetalol  -Goal MAP >  65  -ECHO pending  -Lipid panel: Cholesterol 121, HDL 44, LDL 68.4, triglycerides 43, and VLDL 8.6    GASTROINTESTINAL:  Chronic PEG   -NPO SLP to evaluate  -Famotidine for GI prophylaxis  -PRN OBR   -G-tube clamped     RENAL/ELECTROLYTE/FLUIDS:   -Serial laboratory results, replete as indicated  -Closely monitor urine output  -Goal sodium (150 -155 mmol/L) on 3% hypertonic saline, close laboratory monitoring    ENDOCRINE:   -HGB A1c 5.8%  -Goal BG (120 - 180), PRN SSI  -On Decadron Q6  -Avoid hypo/hyper glycemia  -TSH pending    HEMATOLOGY/ONCOLOGY:   -Trend CBC  -Coag studies pending  -Goal (hgb) > 7.0, transfuse as indicated    INFECTIOUS DISEASE:   -No active issue    ANTIBIOTICS TO DATE: None    CULTURES TO DATE: None      ICU DAILY CHECKLIST     Code Status:Full  DVT Prophylaxis:SCDs in the setting of ICH  T/L/D: G-tube  Lines:PIV  Drains: None  SUP: Famotidine  Diet: NPO>SLP  Activity Level:As tol w/PT/OT  ABCDEF Bundle/Checklist Completed:Yes  Disposition: ICU  Multidisciplinary Rounds Completed: Yes  Goals of Care Discussion/Palliative: Yes  Patient/Family Updated: Yes    HOSPITAL COURSE/DAILY EVENT LOG   As Above    SUBJECTIVE   Review of Systems   Constitutional:  Positive for activity change. Negative for chills and fever.   Respiratory:  Negative for shortness of breath and wheezing.    Cardiovascular:  Negative for chest pain and leg swelling.   Gastrointestinal:  Negative for abdominal pain.   Skin: Negative.    Neurological:  Positive for speech difficulty. Negative for facial asymmetry.   Psychiatric/Behavioral:  Positive for confusion.         OBJECTIVE   Physical Exam  Vitals reviewed.   Constitutional:       General: She is not in acute distress.     Appearance: Normal appearance.   HENT:      Head: Normocephalic and atraumatic.      Mouth/Throat:      Mouth: Mucous membranes are moist.      Pharynx: Oropharynx is clear.   Eyes:      Extraocular Movements: Extraocular movements intact.       Conjunctiva/sclera: Conjunctivae normal.      Pupils: Pupils are equal, round, and reactive to light.   Cardiovascular:      Rate and Rhythm: Normal rate.      Pulses: Normal pulses.   Pulmonary:      Effort: Pulmonary effort is normal.      Breath sounds: Normal breath sounds.   Abdominal:      General: Bowel sounds are normal. There is no distension.      Tenderness: There is no abdominal tenderness.   Musculoskeletal:      Cervical back: Normal range of motion.   Skin:     Capillary Refill: Capillary refill takes less than 2 seconds.   Neurological:      Mental Status: She is alert.  Comments: Some expressive/receptive aphasia       Labs and Data: Reviewed 08/02/22  Medications: Reviewed 08/02/22  Imaging: Reviewed 08/02/22      BP 129/73   Pulse 92   Temp 98.3 F (36.8 C) (Oral)   Resp 29   Wt 62.1 kg (136 lb 14.5 oz)   SpO2 90%      Temp (24hrs), Avg:98.1 F (36.7 C), Min:97.5 F (36.4 C), Max:98.4 F (36.9 C)           Intake/Output:     Intake/Output Summary (Last 24 hours) at 08/02/2022 0952  Last data filed at 08/02/2022 0800  Gross per 24 hour   Intake 773.5 ml   Output 300 ml   Net 473.5 ml       Imaging    No valid procedures specified.       CRITICAL CARE DOCUMENTATION  I had a face to face encounter with the patient, reviewed and interpreted patient data including clinical events, labs, images, vital signs, I/O's, and examined patient.  I have discussed the case and the plan and management of the patient's care with the consulting services, the bedside nurses and the respiratory therapist.      NOTE OF PERSONAL INVOLVEMENT IN CARE   This patient has a high probability of imminent, clinically significant deterioration, which requires the highest level of preparedness to intervene urgently. I participated in the decision-making and personally managed or directed the management of the following life and organ supporting interventions that required my frequent assessment to treat or prevent  imminent deterioration.    I personally spent 45 minutes of critical care time.  This is time spent at this critically ill patient's bedside actively involved in patient care as well as the coordination of care.  This does not include any procedural time which has been billed separately.    Larwance Sachs, APRN - CNP   Critical Care Medicine  [employer]

## 2022-08-02 NOTE — Procedures (Signed)
EEG Session Report  Patient Name: Kelsey Mullins  Medical ID: [id number]  Date of Birth: 06/08/1956  Age: 66  Session Duration:  Aug 02, 2022 3:53 PM - Aug 02, 2022 5:05 PM  Recording Total Time: 01:11:48  Ordering Physician: [facility]  Primary Indication: ICH/SAH  Location: ICU/Floor    Description of procedure: This EEG was obtained using a 10 lead, 8 channel system positioned circumferentially without any parasagittal coverage (rapid EEG). Computer selected EEG is reviewed as well as background features and all clinically significant events. Clarity algorithm utilized and implemented to provide analysis of underlying activity and seizure detection used to facilitate reading.    Description of recording: Background activity consists of 8 Hz frequency rhythms with slower frequencies seen in the left frontal region.  Significant amount of muscle tension and movement artifact seen in all regions.     Impressions: Suboptimal study due to excessive artifact.  Mild focal slowing on the left likely due to underlying stroke.  No electrographic seizures or epileptiform disturbance seen    Comments: Consider routine 18 channel EEG for further clarification as it provides better spatial resolution and parasagittal coverage    Report prepared by: N/A  Report generated on: Aug 02, 2022 5:24 PM UTC-4

## 2022-08-03 ENCOUNTER — Inpatient Hospital Stay: Payer: PRIVATE HEALTH INSURANCE | Primary: Internal Medicine

## 2022-08-03 ENCOUNTER — Inpatient Hospital Stay: Admit: 2022-08-03 | Payer: PRIVATE HEALTH INSURANCE | Primary: Internal Medicine

## 2022-08-03 LAB — CBC
Hematocrit: 40.9 % (ref 35.0–47.0)
Hemoglobin: 13.4 g/dL (ref 11.5–16.0)
MCH: 31.5 PG (ref 26.0–34.0)
MCHC: 32.8 g/dL (ref 30.0–36.5)
MCV: 96 FL (ref 80.0–99.0)
MPV: 9.3 FL (ref 8.9–12.9)
Nucleated RBCs: 0 PER 100 WBC
Platelets: 201 10*3/uL (ref 150–400)
RBC: 4.26 M/uL (ref 3.80–5.20)
RDW: 12.8 % (ref 11.5–14.5)
WBC: 12.4 10*3/uL — ABNORMAL HIGH (ref 3.6–11.0)
nRBC: 0 10*3/uL (ref 0.00–0.01)

## 2022-08-03 LAB — BASIC METABOLIC PANEL W/ REFLEX TO MG FOR LOW K
Anion Gap: 6 mmol/L (ref 5–15)
Anion Gap: 7 mmol/L (ref 5–15)
BUN: 9 MG/DL (ref 6–20)
BUN: 6 MG/DL (ref 6–20)
Bun/Cre Ratio: 20 (ref 12–20)
Bun/Cre Ratio: 18 (ref 12–20)
CO2: 27 mmol/L (ref 21–32)
CO2: 25 mmol/L (ref 21–32)
Calcium: 9.1 MG/DL (ref 8.5–10.1)
Calcium: 9 MG/DL (ref 8.5–10.1)
Chloride: 108 mmol/L (ref 97–108)
Chloride: 110 mmol/L — ABNORMAL HIGH (ref 97–108)
Creatinine: 0.46 MG/DL — ABNORMAL LOW (ref 0.55–1.02)
Creatinine: 0.34 MG/DL — ABNORMAL LOW (ref 0.55–1.02)
Est, Glom Filt Rate: >60 mL/min/{1.73_m2} (ref >60)
Est, Glom Filt Rate: >60 mL/min/{1.73_m2} (ref >60)
Glucose: 121 mg/dL — ABNORMAL HIGH (ref 65–100)
Glucose: 116 mg/dL — ABNORMAL HIGH (ref 65–100)
Potassium: 3.8 mmol/L (ref 3.5–5.1)
Potassium: 3.8 mmol/L (ref 3.5–5.1)
Sodium: 141 mmol/L (ref 136–145)
Sodium: 142 mmol/L (ref 136–145)

## 2022-08-03 LAB — BASIC METABOLIC PANEL
Anion Gap: 6 mmol/L (ref 5–15)
BUN: 8 MG/DL (ref 6–20)
Bun/Cre Ratio: 20 (ref 12–20)
CO2: 25 mmol/L (ref 21–32)
Calcium: 8.8 MG/DL (ref 8.5–10.1)
Chloride: 110 mmol/L — ABNORMAL HIGH (ref 97–108)
Creatinine: 0.4 MG/DL — ABNORMAL LOW (ref 0.55–1.02)
Est, Glom Filt Rate: >60 mL/min/{1.73_m2} (ref >60)
Glucose: 112 mg/dL — ABNORMAL HIGH (ref 65–100)
Potassium: 3.7 mmol/L (ref 3.5–5.1)
Sodium: 141 mmol/L (ref 136–145)

## 2022-08-03 LAB — POCT GLUCOSE
POC Glucose: 111 mg/dL (ref 65–117)
POC Glucose: 118 mg/dL — ABNORMAL HIGH (ref 65–117)
POC Glucose: 119 mg/dL — ABNORMAL HIGH (ref 65–117)
POC Glucose: 124 mg/dL — ABNORMAL HIGH (ref 65–117)
POC Glucose: 164 mg/dL — ABNORMAL HIGH (ref 65–117)

## 2022-08-03 LAB — URINALYSIS WITH REFLEX TO CULTURE
BACTERIA, URINE: NEGATIVE /hpf
Bilirubin Urine: NEGATIVE
Glucose, UA: NEGATIVE mg/dL
Ketones, Urine: NEGATIVE mg/dL
Leukocyte Esterase, Urine: NEGATIVE
Nitrite, Urine: NEGATIVE
Protein, UA: NEGATIVE mg/dL
Specific Gravity, UA: 1.014 (ref 1.003–1.030)
Urobilinogen, Urine: 0.2 EU/dL (ref 0.2–1.0)
pH, Urine: 7 (ref 5.0–8.0)

## 2022-08-03 LAB — PHOSPHORUS: Phosphorus: 3.5 MG/DL (ref 2.6–4.7)

## 2022-08-03 LAB — EXTRA TUBES HOLD

## 2022-08-03 LAB — MAGNESIUM: Magnesium: 2 mg/dL (ref 1.6–2.4)

## 2022-08-03 MED ORDER — STERILE WATER FOR INJECTION (MIXTURES ONLY)
1 g | INTRAMUSCULAR | Status: AC
Start: 2022-08-03 — End: 2022-08-06
  Administered 2022-08-03 – 2022-08-05 (×3): 1000 mg via INTRAVENOUS

## 2022-08-03 MED ORDER — POTASSIUM CHLORIDE ER 10 MEQ PO TBCR
10 MEQ | Freq: Once | ORAL | Status: AC
Start: 2022-08-03 — End: 2022-08-03
  Administered 2022-08-03: 07:00:00 20 meq via ORAL

## 2022-08-03 MED FILL — FAMOTIDINE (PF) 20 MG/2ML IV SOLN: 20 MG/2ML | INTRAVENOUS | Qty: 2

## 2022-08-03 MED FILL — ACETAMINOPHEN 325 MG PO TABS: 325 MG | ORAL | Qty: 2

## 2022-08-03 MED FILL — SODIUM CHLORIDE 3 % IV SOLN: 3 % | INTRAVENOUS | Qty: 500

## 2022-08-03 MED FILL — LEVETIRACETAM 500 MG/5ML IV SOLN: 500 MG/5ML | INTRAVENOUS | Qty: 10

## 2022-08-03 MED FILL — VENLAFAXINE HCL 37.5 MG PO TABS: 37.5 MG | ORAL | Qty: 1

## 2022-08-03 MED FILL — CEFTRIAXONE SODIUM 1 G IJ SOLR: 1 g | INTRAMUSCULAR | Qty: 1000

## 2022-08-03 MED FILL — ONDANSETRON HCL 4 MG/2ML IJ SOLN: 4 MG/2ML | INTRAMUSCULAR | Qty: 2

## 2022-08-03 MED FILL — K-TAB 10 MEQ PO TBCR: 10 MEQ | ORAL | Qty: 2

## 2022-08-03 NOTE — Care Coordination-Inpatient (Signed)
Transition of Care Plan:    RUR: 11%  Prior Level of Functioning: Needs assistance with ADL's  Disposition:   IPR: Date FOC offered: 08/02/22  Date FOC received: 08/02/22  DME needed: TBD by rehab  Transportation at discharge: BLS  Is patient a Cytogeneticist and connected with [facility]? No  Caregiver Contact: Spouse Kaylena Pacifico [phone]  Discharge Caregiver contacted prior to discharge? NA  Care Conference needed? No  Barriers to discharge:    Started on ABX for UTI symptoms  Referral to [facility] made through [facility].   Catarina Hartshorn RN,Care Management

## 2022-08-03 NOTE — Progress Notes (Signed)
Neurology Progress Note  Madelon Lips, APRN - NP    Admit Date: 08/01/2022   LOS: 2 days      Daily Progress Note: 08/03/2022      C/C:     Patient presents with    Seizure-like activity, intracranial hemorrhage.      HPI:   Kelsey Mullins is a 66 y.o. female with a pmh coronary artery disease, status post CABG, COPD, asthma, tobacco and alcohol use, who was admitted on 06/28/2022 with chest pain. The pt was transferred from [facility] for elevated troponin, to be seen by Cardiology [facility]. Patient was started on aspirin, Plavix and Lovenox. Patient was treated for potential alcohol withdrawal. Whilst pt was there, she developed right-sided weakness and difficulty speaking, CT showed large intracranial hemorrhage in the left frontal lobe, patient was seen by tele neurology and Interventional Neurology as well as Neurosurgery. She was started on Cardene drip for blood pressure control, and was given hypertonic saline. Patient had dysphagia and she had a PEG tube placed. Pt was on statin which she had difficulty with the statin thus this was discontinued and alternative medication was started.  She was then dc to [facility] and making good progress and was about to be discharged with a walker. She developed an event where she became unresponsive and apparently was staring.  A seizure was suspected and had a CT brain, which showed hemorrhage within the area of infarction in the left frontal lobe.  She was transferred over to [facility] for further management. CTA of the head and neck did not show any aneurysm, AVM, occlusion, or dissection. Reportedly, pt sustained a fall about a week ago. Neurology was consulted for further evaluation. rEEG showed no epileptiform discharges or electrographic seizures. MRI of the brain does not show any new areas of ischemia or underlying structural mass lesions.    SUBJECTIVE:   No acute events noted overnight. No sz or unresponsiveness noted or  reported except for being restless and pulling leads thus pt in restrains now. Reportedly CTH was obtained around 3pm for staring episodes.     Allergies   Allergen Reactions    Crestor [Rosuvastatin]      Unknown      Lipitor [Atorvastatin]      Unknown         No past medical history on file.  No family history on file.  Social History     Tobacco Use    Smoking status: Not on file    Smokeless tobacco: Not on file   Substance Use Topics    Alcohol use: Not on file      None         OBJECTIVES:   Temp (24hrs), Avg:98.5 F (36.9 C), Min:98.3 F (36.8 C), Max:98.9 F (37.2 C)   08/17 1901 - 08/18 0700  In: 131.7 [I.V.:101.7]  Out: 0   08/16 0701 - 08/17 1900  In: 1103.5 [I.V.:745]  Out: 850 [Urine:850]  BP 118/76   Pulse 76   Temp 98.9 F (37.2 C) (Oral)   Resp 21   Ht 5' 1.1" (1.552 m) Comment: office note  Wt 136 lb 14.5 oz (62.1 kg)   SpO2 99%   BMI 25.78 kg/m        Vitals:    08/02/22 2200 08/02/22 2300 08/03/22 0000 08/03/22 0100   BP: 128/78 137/81 137/81 118/76   Pulse: 73 75 76 76   Resp: 21 15 19  21  Temp:       TempSrc:       SpO2: 99% 92%  99%   Weight:       Height:            Meds:     Current Facility-Administered Medications   Medication Dose Route Frequency    labetalol (NORMODYNE;TRANDATE) injection 10 mg  10 mg IntraVENous Q6H PRN    [Held by provider] 0.9 % sodium chloride infusion   IntraVENous Continuous    famotidine (PEPCID) 20 mg in sodium chloride (PF) 0.9 % 10 mL injection  20 mg IntraVENous BID    insulin lispro (HUMALOG) injection vial 0-8 Units  0-8 Units SubCUTAneous TID WC    insulin lispro (HUMALOG) injection vial 0-4 Units  0-4 Units SubCUTAneous Nightly    glucose chewable tablet 16 g  4 tablet Oral PRN    dextrose bolus 10% 125 mL  125 mL IntraVENous PRN    Or    dextrose bolus 10% 250 mL  250 mL IntraVENous PRN    glucagon injection 1 mg  1 mg SubCUTAneous PRN    dextrose 10 % infusion   IntraVENous Continuous PRN    sodium chloride flush 0.9 % injection 5-40 mL   5-40 mL IntraVENous BID    3% sodium chloride (HYPERTONIC) IV infusion  50 mL/hr IntraVENous Continuous    venlafaxine (EFFEXOR) tablet 37.5 mg  37.5 mg Oral BID WC    levETIRAcetam (KEPPRA) injection 750 mg  750 mg IntraVENous Q12H    sodium chloride flush 0.9 % injection 5-40 mL  5-40 mL IntraVENous 2 times per day    sodium chloride flush 0.9 % injection 5-40 mL  5-40 mL IntraVENous PRN    0.9 % sodium chloride infusion   IntraVENous PRN    acetaminophen (TYLENOL) tablet 650 mg  650 mg Oral Q4H PRN    ondansetron (ZOFRAN) injection 4 mg  4 mg IntraVENous Q6H PRN     I personally reviewed all of the medications    Review of Systems:   No headache, eye, ear nose, throat problems; no coughing or wheezing or shortness of breath, No chest pain or orthopnea, no abdominal pain, nausea or vomiting, No pain in the body or extremities, no psychiatric, neurological, endocrine, hematological or cardiac complaints except as noted above.     Physical Exam:   Blood pressure 118/76, pulse 76, temperature 98.9 F (37.2 C), temperature source Oral, resp. rate 21, height 5' 1.1" (1.552 m), weight 136 lb 14.5 oz (62.1 kg), SpO2 99 %.    GEN: Cooperative with exams, restless, well developed and nourished patient in NAD  HEENT: Normocephalic. Non-icter, no congestion  Lungs:  CTA bilaterally Ant; non-labored breathing  Cardiac: S1,S2, normal rate and rhythm, with no murmurs. no carotid bruits, no gallops  Abdomen: Normal bowel sounds, no distention, soft, non-tender  Extremities: 2+ Radial pulses, no clubbing, cyanosis, or edema  Skin: no rashes or lesions noted      NEURO: exams waxes/wanes   Mental status: Oriented to person and place, disoriented to yr, situation, place and month. Able to follow commands but slow to response  Cranial Nerves: attention and fund of knowledge is impaired. Has some difficulties finding words, EOMI, PERRL, 32mm, no nystagmus, no ptosis. Full facial strength, mild asymmetry smile. Hearing intact  bilaterally. Tongue protrudes to midline, palate elevates symmetrically. Shrug Shoulders B/L  Motor: Normal bulk and tone, 5/5 strength x 4 extremities proximally and distally; Noted BUE tremors otherwise  no involuntary movements.  Coordination: Intact FTN and HTS testing with tremors  Reflexes:  +2 throughout, down going toes bilaterally   Sensation: Intact x 4 extremities to Light Touch  Gait:  Deferred     Labs/images:     Lab Results   Component Value Date    WBC 12.4 (H) 08/03/2022    HGB 13.4 08/03/2022    HCT 40.9 08/03/2022    MCV 96.0 08/03/2022    PLT 201 08/03/2022      Lab Results   Component Value Date    NA 141 08/02/2022    K 3.8 08/02/2022    CL 108 08/02/2022    CO2 27 08/02/2022    BUN 9 08/02/2022    CREATININE 0.46 (L) 08/02/2022    GLUCOSE 121 (H) 08/02/2022    CALCIUM 9.1 08/02/2022    PROT 6.8 08/01/2022    LABALBU 3.7 08/01/2022    BILITOT 0.3 08/01/2022    ALKPHOS 64 08/01/2022    AST 21 08/01/2022    ALT 34 08/01/2022    LABGLOM >60 08/02/2022    GLOB 3.1 08/01/2022       CT Results (most recent):    CT HEAD WO CONTRAST 08/02/2022    Narrative  EXAM: CT HEAD WO CONTRAST    INDICATION: Altered mental status    COMPARISON: MRI brain 08/02/2022, CT head 08/01/2022.    CONTRAST: None.    TECHNIQUE: Unenhanced CT of the head was performed using 5 mm images. Brain and  bone windows were generated. Coronal and sagittal reformats. CT dose reduction  was achieved through use of a standardized protocol tailored for this  examination and automatic exposure control for dose modulation.    FINDINGS:  Grossly stable approximately 5.7 cm x 5.2 cm x 3.3 cm left frontal mixed blood  and CSF density focus, with apparent slightly decreased degree of blood  component compared to prior. Stable 7 mm rightward midline shift anteriorly.  Stable mild mass effect upon the frontal horn of the left lateral ventricle,  without overt hydrocephalus. No transtentorial or transforaminal herniation. No  CT evidence of acute  infarct.    The bone windows demonstrate no abnormalities. The visualized portions of the  paranasal sinuses and mastoid air cells are clear.    Impression  Stable in size, but decreased in blood component, mixed blood and CSF density  hemorrhage in the left frontal lobe. Stable rightward midline shift and mild  mass effect upon the anterior horn of the left lateral ventricle.     MRI Result (most recent):  MRI BRAIN W WO CONTRAST 08/02/2022    Narrative  EXAM:  MRI BRAIN W WO CONTRAST    INDICATION:    ICH    COMPARISON:  08/01/2022.    CONTRAST: 10 ml ProHance.    TECHNIQUE:  Multiplanar multisequence acquisition without and with contrast of the brain.    FINDINGS:  The ventricles are normal in size and position. Left frontal intraparenchymal  hemorrhage is again noted measuring 6.6 x 4.1 x 6.3 cm with slight increase in  size of in the AP and lateral dimensions. Mass effect on the frontal horn of the  left lateral ventricle with left right midline shift measuring 5 mm not  significant change. There is no cerebellar tonsillar herniation. Expected  arterial flow-voids are present. Evaluation for enhancement is limited by  intrinsic T1 hyperintensity. No definite enhancement is identified.    The paranasal sinuses, mastoid air cells, and middle ears  are clear. The orbital  contents are within normal limits. No significant osseous or scalp lesions are  identified.    Impression  Left frontal intraparenchymal hemorrhage with slight increase in size when  compared to prior study. Left right midline shift is unchanged. No definite  enhancement is noted although evaluation is limited by intrinsic T1  hyperintensity.       Assessment:   Principal Problem:    Intracranial hemorrhage (HCC)  Active Problems:    Altered mental status  Resolved Problems:    * No resolved hospital problems. *    Plan:   Repeat CTH  was recommended this morning however, intensivist repeated CTH 08/17 around 3pm for staring episodes which was  stable   Continue with Q2hr neuro checks  Can incr Keppra from 750mg  to 1g bid given pt wax/wanes neuro changes with staring episodes; will defer to Dr. showed no electrographic seizures or epileptiform discharges  MRIb wwo showed Left frontal intraparenchymal hemorrhage with slight increase in size when compared to prior study. Left right midline shift is unchanged  Pt was started on 3% per intensivist. Current N+ is 141  Will defer 3% management to intensivist.   Hold anti-platelets for now in the settings of acute bleed   PT/OT/ST evals  Low threshold to repeat CT head if any worsening change in neurologic condition       Chart reviewed    Case discussed with: intensivist and primary nurse     >35% time spent in counseling or coordination of care of the above in the assessment and plan     Signed By: Bernerd Limbo, APRN - NP                    August 03, 2022    Please note that this dictation was completed with Dragon, the computer voice recognition software.  Quite often unanticipated grammatical, syntax, homophones, and other interpretive errors are inadvertently transcribed by the computer software.  Please disregard these errors.  Please excuse any errors that have escaped final proofreading.

## 2022-08-03 NOTE — Plan of Care (Addendum)
Speech LAnguage Pathology TREATMENT    Patient: Kelsey Mullins (66 y.o. female)  Date: 08/03/2022  Primary Diagnosis: Intracranial hemorrhage (HCC) [I62.9]  Intraparenchymal hematoma of brain, left, with loss of consciousness of 30 minutes or less, initial encounter (HCC) [N56.213Y]       Precautions: Aspiration, Fall Risk (SBP <140)     ASSESSMENT :  Therapy targeted swallowing and speech/language. Patient given PO trials of thin liquids and solids. Oral phase functional given additional time for mastication and bolus clearance of solid. Pharyngeal phase did not reveal overt s/s of aspiration with either consistency. Benefited from verbal cues to implement liquid wash and/or to throat clear intermittently, both of which were reportedly recommended following recent MBS at OSH. Recommend she continue regular texture diet with thin liquids and swallow precautions as listed below. Will continue to follow for dysphagia management.     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 Confrontation naming at 30% independently, increasing in accuracy to 80% with phonemic or semantic cues. Patient benefited from repetition for auditorily-presented information and simplification of language. Y/N responses appeared reliable in context. Recommend ongoing speech therapy services to address mixed aphasia at this and next levels of care, preferably inpatient rehab setting.     Patient will benefit from skilled intervention to address the above impairments.     PLAN :  Recommendations and Planned Interventions:  Diet: Regular and thin liquids  -Oral medications whole with liquids or with purees (as tolerated)  -Aspiration precautions: Upright position, small bites and sips, alternate solids/liquids, intermittently clear throat  -Routine oral care 2-3 times daily   -Provide sematic/phonemic cues or binary choices to improve naming/word finding  -Provide additional time, use simple  language, and provide visual context to improve comprehension    Acute SLP Services: Yes, SLP will continue to follow per plan of care.    Discharge Recommendations: Yes, recommend SLP treatment at next level of care, Inpatient rehab     SUBJECTIVE:   Patient stated, "rehab" when asked current location.     OBJECTIVE:   No past medical history on file.No past surgical history on file.    Baseline Assessment:  Current Diet: Regular texture with thin liquids    Cognitive and Communication Status:  Neurologic State: Alert  Orientation Level: Oriented to person, Oriented to time with binary choice, Partially oriented to place (+city, -name of hospital)  Cognition: Assessment ongoing as language improves (Has mixed aphasia, Unable to formally assess cognitive skills as a result)    Dysphagia:  P.O. Trials:  Assessment Method: Observation  Patient Position: Upright in bed  Vocal Quality: WFL   Consistency Presented: Thin, Solids  How Presented: SLP-fed/presented via cup, straw  Bolus Acceptance: No impairment  Bolus Formation/Control:  No impairment  Type of Impairment: Slowed mastication  Propulsion: Delayed  Oral Residue: None  Laryngeal Elevation: Functional  Aspiration Signs/Symptoms: None    Speech/Language:   Verbal expression: Impaired  Repetition: 100%  Naming: 30%  Auditory Comprehension: Impaired  1-step commands: 70%  2-step commands: 50%  Basic Y/N responses: 80%     Respiratory Status/Airway:  Room air    After treatment:   Patient left in no apparent distress in bed, Call bell left within reach, and Caregiver present (daughter)    COMMUNICATION/EDUCATION:   The patient's plan of care including planned interventions were discussed with: Registered nurse    Thank you,  Margo Aye, SLP  Minutes: 15     Problem: SLP  Adult - Impaired Swallowing  Goal: By Discharge: Advance to least restrictive diet without signs or symptoms of aspiration for planned discharge setting.  See evaluation for individualized  goals.  Description: Speech Therapy Goals  Initiated 08/02/22  1. Patient will tolerate baseline diet without adverse effects within 7 days.   2. Patient will participate in automatic speech tasks with 50% accuracy independently within 7 days.   3. Patient will participate in word retrieval tasks with 50% accuracy independently within 7 days.   4. Patient will participate in simple-moderate receptive language tasks with 60% accuracy within 7 days.   Outcome: Progressing

## 2022-08-03 NOTE — Progress Notes (Signed)
Bedside shift change report given to Maralyn Sago, RN (oncoming nurse) by Lanora Manis, RN (offgoing nurse). Report included the following information Nurse Handoff Report, dual neuro.     0800 - non-blanchable erythema noted above patient's IV site in right arm. IV previously had 3% saline running, but now saline locked. Marca Ancona, NP notified - verbal order to place warm compress and remove IV.

## 2022-08-03 NOTE — Progress Notes (Addendum)
Occupational Therapy  08/03/22    Chart reviewed, discussed with PT. Pt lethargic, family present, and declined activity due to not feeling well, reports generalized pain, denied headache. Per family, pt with decreased p.o. intake today, medicated for nausea earlier, has been less interactive today, RN aware. Will follow up as able/appropriate for OT treatment.     Thank you,   Blinda Leatherwood, OTD, OTR/L

## 2022-08-03 NOTE — Procedures (Unsigned)
[name]  EEG    Name:  Kelsey Mullins, Kelsey Mullins  MR#:  [id number]  DOB:  08/09/1956  ACCOUNT #:  [id number]  DATE OF SERVICE:  08/03/2022      REQUESTING PHYSICIAN:  Lance Bosch, MD    HISTORY:  The patient is 66 year old female who is being evaluated for episodes of aphasia.    Brain imaging has shown left frontal stroke with hemorrhagic transformation.    DESCRIPTION:  This is an 18-channel EEG performed on an awake patient.  The dominant posterior background rhythm consists of medium voltage rhythms in the 8-9 Hz frequency range out of the posterior head region.  There are mixed frequencies with superimposed muscle tension artifact seen in the frontal and temporal region throughout the recording.  There is overall slowing in the left frontal region when compared to the right with intermittent theta frequencies seen.  Drowsiness and sleep architecture was not noted.  Photic stimulation and hyperventilation was not performed.    EEG SUMMARY:  Abnormal EEG due to mild-to-moderate slowing in the left frontal area.    CLINICAL INTERPRETATION:  This EEG shows mild-to-moderate focal slowing in the left frontal region, likely due to the underlying history of stroke/hemorrhage.  No epileptiform activity or electrographic seizures were seen.  Please correlate with imaging.      Lance Bosch, MD      [id number]  D:  08/03/2022 16:31  T:  08/03/2022 18:58  JOB #:  [id number]

## 2022-08-03 NOTE — Progress Notes (Signed)
CRITICAL CARE NOTE      Name: Kelsey Mullins   DOB: 1956/03/31   MRN: [id number]   Date: 08/03/2022      REASON FOR ICU ADMISSION:  Altered mental status, ?seizure, Intraparenchymal hemorrhage      PRINCIPAL ICU DIAGNOSIS   Unresponsive  ?  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  PMHx significant for CAD (CABG), COPD, GERD, NSTEMI, and recent left frontal intraparenchymal hemorrhage which was diagnosed earlier this year at [facility].  She was initially admitted (7/13) due to chest pain, dizziness and shortness of breath at which time she was treated for an NSTEMI and started on Plavix, aspirin, and enoxaparin.  She subsequently developed impaired speech and right-sided weakness at which time a code stroke was called.  Imaging revealed a large left frontal intraparenchymal hemorrhage with extension into the ventricles with convexity subarachnoid blood redistribution with a 6 mm midline shift to the right on (7/14).  There was no CT evidence of aneurysm, AVM, occlusion or dissection.  At that time patient was admitted to the ICU where neurosurgery was consulted and maintained on nicardipine infusion and 3%.  No surgical intervention was necessary, G-tube placed and discharged to inpatient rehab at [facility].      On (8/16) patient was nearing planned discharge at [facility] facility when she developed altered mental status/unresponsiveness, staring off into space.  Per report physician at the facility tried to sternal rub patient without.  Episode spontaneously resolved.  She was then transported to the emergency department for further observation.  While in route to the emergency department per EMS patient began  to have projectile vomiting.  On arrival to the ED a code stroke was called and a stat NCCT head revealed a new left frontal intracranial hemorrhage.  Neurosurgery was consulted- concerned that she has rebled into the old stroke bed.  She was admitted to the ICU for further observation and management.    COMPREHENSIVE ASSESSMENT & PLAN:SYSTEM BASED     24 HOUR EVENTS: No further evidence of seizure activity overnight.  She did experience an episode of staring off into space yesterday afternoon with noted tremors of right upper extremity.  EEG unremarkable.  CT head did not reveal any acute change from prior CT.  Patient is alert, moving all extremities but is uncertain of the date and where she is at.  Family reports that she had recent urinary tract infection with some continued symptoms, will culture and empirically start on 3-day course of ceftriaxone.    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 Unclear whether acute rebleeding into chronic hematoma versus  evolution of a recent acute intraparenchymal hemorrhage as given in clinical  history.  8/17 /17 NCCT h - Re demonstrates intraparenchymal hemorrhage that is stable in size but there is evidence of decrease in blood component, mixed blood/CSF density in the left frontal lobe.  Straight rightward midline shift and mild mass effect on anterior horn of left lateral  ventricle.  8/17 MRI W WO - Left frontal intraparenchymal hemorrhage w/L>R midline shift unchanged. No definite enhancement noted.     PULMONOLOGY:  Hx COPD   -Goal oxygen saturation > 92% titrate NC as indicated  -HOB > 30 degrees  -PRN bronchodilator as indicated    CARDIOVASCULAR:  Hx CAD, NSTEMI, CABG   -Goal SBP < 140 mmHg, PRN labetalol  -Goal MAP > 65  -Lipid panel: Cholesterol 121, HDL 44, LDL 68.4, triglycerides 43, and VLDL 8.6    GASTROINTESTINAL:  Chronic PEG   -SLP to evaluated> advance diet per recs  -Famotidine for GI prophylaxis  -PRN OBR   -G-tube clamped     RENAL/ELECTROLYTE/FLUIDS:   -Serial laboratory results, replete as indicated  -Closely monitor urine output    ENDOCRINE:   -HGB A1c 5.8%  -Goal BG (120 - 180), PRN SSI  -Avoid hypo/hyper glycemia  -TSH 0.71    HEMATOLOGY/ONCOLOGY:   -Trend CBC  -Coag studies pending  -Goal (hgb) > 7.0, transfuse as indicated    INFECTIOUS DISEASE:   -Recent UTI, ?symptomatic> obtain cx and empiric 3 day course ceftriaxone    ANTIBIOTICS TO DATE: Ceftriaxone    CULTURES TO DATE: None  8/18 Uc  8/18 Bc    ICU DAILY CHECKLIST     Code Status:Full  DVT Prophylaxis:SCDs in the setting of ICH  T/L/D: G-tube  Lines:PIV  Drains: None  SUP: Famotidine  Diet: SLP> clears  Activity Level:As tol w/PT/OT  ABCDEF Bundle/Checklist Completed:Yes  Disposition: ICU  Multidisciplinary Rounds Completed: Yes  Goals of Care Discussion/Palliative: Yes  Patient/Family Updated: Yes    HOSPITAL COURSE/DAILY EVENT LOG   8/17: Overnight MRI with and without contrast was obtained revealing left frontal intraparenchymal hemorrhage and slight increase of size from prior study.  There was no change in left to right midline shift.  No definitive enhancement noted throughout evaluation.  Sodium began trending down and she was started on 3% hyper tonic saline ongoing stroke work-up pending    SUBJECTIVE   Review of Systems   Constitutional:  Negative for activity change, chills and fever.    Respiratory:  Negative for shortness of breath and wheezing.    Cardiovascular:  Negative for chest pain and leg swelling.   Gastrointestinal:  Negative for abdominal pain.   Skin: Negative.    Neurological:  Negative for facial asymmetry and speech difficulty.   Psychiatric/Behavioral:  Positive for confusion.         OBJECTIVE   Physical Exam  Vitals reviewed.   Constitutional:       General: She is not in acute distress.     Appearance: Normal appearance.   HENT:      Head: Normocephalic and atraumatic.      Mouth/Throat:      Mouth: Mucous membranes are moist.      Pharynx: Oropharynx is clear.   Eyes:      Extraocular Movements: Extraocular movements intact.      Conjunctiva/sclera: Conjunctivae normal.      Pupils: Pupils are equal, round, and reactive to light.   Cardiovascular:      Rate and Rhythm:  Normal rate.      Pulses: Normal pulses.   Pulmonary:      Effort: Pulmonary effort is normal.      Breath sounds: Normal breath sounds.   Abdominal:      General: Bowel sounds are normal. There is no distension.      Tenderness: There is no abdominal tenderness.   Musculoskeletal:      Cervical back: Normal range of motion.      Right lower leg: No edema.      Left lower leg: No edema.   Skin:     General: Skin is warm and dry.      Capillary Refill: Capillary refill takes less than 2 seconds.   Neurological:      Mental Status: She is alert.      Comments: Some expressive/receptive aphasia  Motor function intack        Labs and Data: Reviewed 08/03/22  Medications: Reviewed 08/03/22  Imaging: Reviewed 08/03/22      BP 134/74   Pulse 73   Temp 98.8 F (37.1 C) (Oral)   Resp 20   Ht 1.552 m (5' 1.1") Comment: office note  Wt 62.1 kg (136 lb 14.5 oz)   SpO2 95%   BMI 25.78 kg/m      Temp (24hrs), Avg:98.6 F (37 C), Min:98.4 F (36.9 C), Max:98.9 F (37.2 C)           Intake/Output:     Intake/Output Summary (Last 24 hours) at 08/03/2022 0938  Last data filed at 08/03/2022 0700  Gross per 24 hour    Intake 951.34 ml   Output 875 ml   Net 76.34 ml       Imaging    No valid procedures specified.       CRITICAL CARE DOCUMENTATION  I had a face to face encounter with the patient, reviewed and interpreted patient data including clinical events, labs, images, vital signs, I/O's, and examined patient.  I have discussed the case and the plan and management of the patient's care with the consulting services, the bedside nurses and the respiratory therapist.      NOTE OF PERSONAL INVOLVEMENT IN CARE   This patient has a high probability of imminent, clinically significant deterioration, which requires the highest level of preparedness to intervene urgently. I participated in the decision-making and personally managed or directed the management of the following life and organ supporting interventions that required my frequent assessment to treat or prevent imminent deterioration.    I personally spent 40 minutes of critical care time.  This is time spent at this critically ill patient's bedside actively involved in patient care as well as the coordination of care.  This does not include any procedural time which has been billed separately.    Larwance Sachs, APRN - CNP   Critical Care Medicine  [employer]

## 2022-08-03 NOTE — Progress Notes (Signed)
Pt not needed at this time per MD. We will cancel order for now.

## 2022-08-03 NOTE — Progress Notes (Signed)
rEEG complete  -- awake state only.

## 2022-08-03 NOTE — H&P (Signed)
History and Physical    Date of Service:  08/03/2022  Primary Care Provider: Desma Mcgregor, MD  Source of information: The patient and Chart review    Chief Complaint: Seizures      History of Presenting Illness:   Kelsey Mullins is a 66 y.o. female who presents with seizure like activity from [facility].      66 y.o. female with a past medical history of CAD status post CABG, HLD, COPD, GERD, ICH 06/2022, who presented to [facility] ED for an episode of unresponsiveness and reported seizure like activity on 8/16.    She was initially admitted (7/13) due to chest pain, dizziness and shortness of breath at which time she was treated for an NSTEMI and started on Plavix, aspirin, and enoxaparin.  She subsequently developed impaired speech and right-sided weakness at which time a code stroke was called.  Imaging revealed a large left frontal intraparenchymal hemorrhage with extension into the ventricles with convexity subarachnoid blood redistribution with a 6 mm midline shift to the right on (7/14).  There was no CT evidence of aneurysm, AVM, occlusion or dissection.  At that time patient was admitted to the ICU where neurosurgery was consulted and maintained on nicardipine infusion and 3%.  No surgical intervention was necessary, G-tube placed and discharged to inpatient rehab at [facility].           REVIEW OF SYSTEMS:  A comprehensive review of systems was negative except for that written in the History of Present Illness.     No past medical history on file.   No past surgical history on file.  Prior to Admission medications    Medication Sig Start Date End Date Taking? Authorizing Provider   atorvastatin (LIPITOR) 20 MG tablet Take 1 tablet by mouth daily   Yes Historical Provider, MD   metoprolol succinate (TOPROL XL) 25 MG extended release tablet Take 1 tablet by mouth daily   Yes Historical Provider, MD   glipiZIDE (GLUCOTROL) 5 MG tablet Take 1 tablet by mouth 2 times daily (before meals)   Yes  Historical Provider, MD     Allergies   Allergen Reactions    Crestor [Rosuvastatin]      Unknown      Lipitor [Atorvastatin]      Unknown        No family history on file.   Social History:     Social Determinants of Health     Tobacco Use: Not on file   Alcohol Use: Not on file   Financial Resource Strain: Not on file   Food Insecurity: Not on file   Transportation Needs: Not on file   Physical Activity: Not on file   Stress: Not on file   Social Connections: Not on file   Intimate Partner Violence: Not on file   Depression: Not on file   Housing Stability: Not on file        Medications were reconciled to the best of my ability given all available resources at the time of admission. Route is PO if not otherwise noted.     Family and social history were personally reviewed, all pertinent and relevant details are outlined as above.    Objective:   BP (!) 141/79   Pulse 76   Temp 99.3 F (37.4 C) (Oral)   Resp 20   Ht 1.552 m (5' 1.1") Comment: office note  Wt 62.1 kg (136 lb 14.5 oz)   SpO2 97%   BMI  25.78 kg/m         PHYSICAL EXAM:   General: Alert x oriented x 3, awake, no acute distress,   HEENT: PEERL, EOMI, moist mucus membranes  Neck: Supple, no JVD, no meningeal signs  Chest: Clear to auscultation bilaterally   CVS: RRR, S1 S2 heard, no murmurs/rubs/gallops  Abd: Soft, non-tender, non-distended, +bowel sounds   Ext: No clubbing, no cyanosis, no edema  Neuro/Psych: Pleasant mood and affect, CN 2-12 grossly intact, sensory grossly within normal limit, Strength 5/5 in all extremities, DTR 1+ x 4  Cap refill: Brisk, less than 3 seconds  Pulses: 2+, symmetric in all extremities  Skin: Warm, dry, without rashes or lesions    Data Review:   I have independently reviewed and interpreted patient's lab and all other diagnostic data    Abnormal Labs Reviewed   CBC WITH AUTO DIFFERENTIAL - Abnormal; Notable for the following components:       Result Value    WBC 11.7 (*)     Neutrophils Absolute 8.5 (*)      All other components within normal limits   COMPREHENSIVE METABOLIC PANEL - Abnormal; Notable for the following components:    Glucose 130 (*)     Creatinine 0.52 (*)     Bun/Cre Ratio 25 (*)     All other components within normal limits   BASIC METABOLIC PANEL - Abnormal; Notable for the following components:    Sodium 135 (*)     Glucose 151 (*)     Creatinine 0.54 (*)     All other components within normal limits   HEMOGLOBIN A1C - Abnormal; Notable for the following components:    Hemoglobin A1C 5.8 (*)     All other components within normal limits   BASIC METABOLIC PANEL W/ REFLEX TO MG FOR LOW K - Abnormal; Notable for the following components:    Glucose 144 (*)     All other components within normal limits   BASIC METABOLIC PANEL W/ REFLEX TO MG FOR LOW K - Abnormal; Notable for the following components:    Glucose 121 (*)     Creatinine 0.46 (*)     All other components within normal limits   BASIC METABOLIC PANEL W/ REFLEX TO MG FOR LOW K - Abnormal; Notable for the following components:    Chloride 111 (*)     Glucose 127 (*)     Creatinine 0.38 (*)     Bun/Cre Ratio 21 (*)     All other components within normal limits   PHOSPHORUS - Abnormal; Notable for the following components:    Phosphorus 1.8 (*)     All other components within normal limits   BASIC METABOLIC PANEL W/ REFLEX TO MG FOR LOW K - Abnormal; Notable for the following components:    Chloride 110 (*)     Glucose 116 (*)     Creatinine 0.34 (*)     All other components within normal limits   BASIC METABOLIC PANEL - Abnormal; Notable for the following components:    Chloride 110 (*)     Glucose 112 (*)     Creatinine 0.40 (*)     All other components within normal limits   CBC - Abnormal; Notable for the following components:    WBC 12.4 (*)     All other components within normal limits   URINALYSIS WITH REFLEX TO CULTURE - Abnormal; Notable for the following components:    Blood, Urine TRACE (*)  All other components within normal limits    POCT GLUCOSE - Abnormal; Notable for the following components:    POC Glucose 146 (*)     All other components within normal limits   POCT GLUCOSE - Abnormal; Notable for the following components:    POC Glucose 118 (*)     All other components within normal limits   POCT GLUCOSE - Abnormal; Notable for the following components:    POC Glucose 124 (*)     All other components within normal limits       @MICRORESULTS @    IMAGING:   CT HEAD WO CONTRAST   Final Result   Stable in size, but decreased in blood component, mixed blood and CSF density   hemorrhage in the left frontal lobe. Stable rightward midline shift and mild   mass effect upon the anterior horn of the left lateral ventricle.            MRI BRAIN W WO CONTRAST   Final Result   Left frontal intraparenchymal hemorrhage with slight increase in size when   compared to prior study. Left right midline shift is unchanged. No definite   enhancement is noted although evaluation is limited by intrinsic T1   hyperintensity.      CT HEAD WO CONTRAST   Final Result   1. Acute intraparenchymal hemorrhage in the left frontal lobe with surrounding   cystic change/encephalomalacia overall measuring 5.2 x 3.3 x 5.4 cm. Surrounding   edema and mass effect results in 6 mm of rightward midline shift. It is unclear   whether this represents acute rebleeding into a chronic hematoma versus   evolution of a recent acute intraparenchymal hemorrhage as given in clinical   history. Comparison with any prior imaging would be very useful.      The findings were called to Dr. on 08/01/2022 7:50 PM by 08/03/2022,   MD.  [phone]              ECG/ECHO:  @LASTCARDIOLOGY @       Notes reviewed from all clinical/nonclinical/nursing services involved in patient's clinical care. Care coordination discussions were held with appropriate clinical/nonclinical/ nursing providers based on care coordination needs.     Assessment:   Given the patient's current clinical presentation, there is a  high level of concern for decompensation if discharged from the emergency department. Complex decision making was performed, which includes reviewing the patient's available past medical records, laboratory results, and imaging studies.    Principal Problem:    Intracranial hemorrhage (HCC)  Active Problems:    Altered mental status    Expressive aphasia  Resolved Problems:    * No resolved hospital problems. *      Plan:     ICH with mass effect/midline shift  -HOB > 30 degrees, aspiration reactions  -Goal SBP < 140 mmHg, PRN labetalol  -Continue keppra 750 mg Q 12 hours  -Tox screen negative  -Q2 H neurologic exam and VS  -3% saline discontinued  -EEG did not reveal any evidence of seizure activity  -PT/OT/SLP  -Appreciate neurosurgery, ?signed off  -Appreciate Neurology    Mild leukocytosis: likely reative  History of CAD s/p CABG /recent NSTEMI  ?UTI on ceftriaxone 8/18--8/20  Dysphagia s/p chronic PEG        DIET: ADULT DIET; Regular; No Concentrated sweets  ADULT ORAL NUTRITION SUPPLEMENT; Breakfast, Dinner; Standard High Calorie/High Protein Oral Supplement  ADULT ORAL NUTRITION SUPPLEMENT; Lunch; Frozen Oral Supplement  ISOLATION PRECAUTIONS: No active isolations  CODE STATUS: Full Code   DVT PROPHYLAXIS: SCDs  FUNCTIONAL STATUS PRIOR TO HOSPITALIZATION: Fully active and ambulatory; able to carry on all self-care without restriction.  Ambulatory status/function: Ambulates with assistance:  Walker   EARLY MOBILITY ASSESSMENT: Recommend routine ambulation while hospitalized with the assistance of nursing staff  ANTICIPATED DISCHARGE: 24-48 hours.  ANTICIPATED DISPOSITION: IPR    CRITICAL CARE WAS PERFORMED FOR THIS ENCOUNTER: YES. I had a face to face encounter with the patient, reviewed and interpreted patient data including clinical events, labs, images, vital signs, I/O's, and examined patient.  I have discussed the case and the plan and management of the patient's care with the consulting services, the  bedside nurses and necessary ancillary providers.  This patient has a high probability of imminent, clinically significant deterioration, which requires the highest level of preparedness to intervene urgently. I participated in the decision-making and personally managed or directed the management of the following life and organ supporting interventions that required my frequent assessment to treat or prevent imminent deterioration.  I personally spent 41 minutes of critical care time.  This is time spent at this critically ill patient's bedside actively involved in patient care as well as the coordination of care and discussions with the patient's family.  This does not include any procedural time which has been billed separately.      Signed By: Teofilo Pod, MD     August 03, 2022         Please note that this dictation may have been completed with Dragon, the computer voice recognition software.  Quite often unanticipated grammatical, syntax, homophones, and other interpretive errors are inadvertently transcribed by the computer software.  Please disregard these errors.  Please excuse any errors that have escaped final proofreading.

## 2022-08-04 ENCOUNTER — Inpatient Hospital Stay: Admit: 2022-08-04 | Payer: PRIVATE HEALTH INSURANCE | Primary: Internal Medicine

## 2022-08-04 LAB — BASIC METABOLIC PANEL
Anion Gap: 4 mmol/L — ABNORMAL LOW (ref 5–15)
BUN: 10 MG/DL (ref 6–20)
Bun/Cre Ratio: 22 — ABNORMAL HIGH (ref 12–20)
CO2: 28 mmol/L (ref 21–32)
Calcium: 9.5 MG/DL (ref 8.5–10.1)
Chloride: 104 mmol/L (ref 97–108)
Creatinine: 0.46 MG/DL — ABNORMAL LOW (ref 0.55–1.02)
Est, Glom Filt Rate: >60 mL/min/{1.73_m2} (ref >60)
Glucose: 102 mg/dL — ABNORMAL HIGH (ref 65–100)
Potassium: 3.6 mmol/L (ref 3.5–5.1)
Sodium: 136 mmol/L (ref 136–145)

## 2022-08-04 LAB — ECHO (TTE) COMPLETE (PRN CONTRAST/BUBBLE/STRAIN/3D)
AV Area by Peak Velocity: 2.6 cm2
AV Peak Gradient: 7 mmHg
AV Peak Velocity: 1.4 m/s
AV Velocity Ratio: 0.71
AVA/BSA Peak Velocity: 1.6 cm2/m2
Ao Root Index: 1.99 cm/m2
Aortic Root: 3.2 cm
Ascending Aorta Index: 1.99 cm/m2
Ascending Aorta: 3.2 cm
Body Surface Area: 1.64 m2
E/E' Lateral: 5.91
E/E' Ratio (Averaged): 7.02
E/E' Septal: 8.13
EF BP: 67 % (ref 55–100)
Fractional Shortening 2D: 38 % (ref 28–44)
IVSd: 0.9 cm (ref 0.6–0.9)
LA Diameter: 4.1 cm
LA Size Index: 2.55 cm/m2
LA Volume A-L A4C: 51 mL (ref 22–52)
LA Volume A-L A4C: 55 mL — AB (ref 22–52)
LA Volume A-L A4C: 58 mL — AB (ref 22–52)
LA Volume A-L A4C: 62 mL — AB (ref 22–52)
LA Volume A/L: 59 mL
LA Volume Index A/L: 37 mL/m2 (ref 16–34)
LA/AO Root Ratio: 1.28
LV E' Lateral Velocity: 11 cm/s
LV E' Septal Velocity: 8 cm/s
LV EDV A2C: 55 mL
LV EDV A4C: 58 mL
LV EDV BP: 56 mL (ref 56–104)
LV EDV Index A2C: 34 mL/m2
LV EDV Index A4C: 36 mL/m2
LV EDV Index BP: 35 mL/m2
LV ESV A2C: 18 mL
LV ESV A4C: 18 mL
LV ESV BP: 19 mL (ref 19–49)
LV ESV Index A2C: 11 mL/m2
LV ESV Index A4C: 11 mL/m2
LV ESV Index BP: 12 mL/m2
LV Ejection Fraction A2C: 66 %
LV Ejection Fraction A4C: 69 %
LV Mass 2D Index: 62.9 g/m2 (ref 43–95)
LV Mass 2D: 101.3 g (ref 67–162)
LV RWT Ratio: 0.33
LVIDd Index: 2.61 cm/m2
LVIDd: 4.2 cm (ref 3.9–5.3)
LVIDs Index: 1.61 cm/m2
LVIDs: 2.6 cm
LVOT Area: 3.5 cm2
LVOT Diameter: 2.1 cm
LVOT Peak Gradient: 4 mmHg
LVOT Peak Velocity: 1 m/s
LVPWd: 0.7 cm (ref 0.6–0.9)
MV A Velocity: 0.85 m/s
MV Area by PHT: 2.8 cm2
MV E Velocity: 0.65 m/s
MV E Wave Deceleration Time: 267.9 ms
MV E/A: 0.76
MV PHT: 77.7 ms
PV AT: 99.9 ms
PV Max Velocity: 1.2 m/s
PV Peak Gradient: 5 mmHg
RV Free Wall Peak S': 13 cm/s
TAPSE: 2.5 cm (ref 1.7–?)
TR Max Velocity: 2.37 m/s
TR Peak Gradient: 22 mmHg

## 2022-08-04 LAB — CBC
Hematocrit: 42.6 % (ref 35.0–47.0)
Hemoglobin: 14.2 g/dL (ref 11.5–16.0)
MCH: 32.1 PG (ref 26.0–34.0)
MCHC: 33.3 g/dL (ref 30.0–36.5)
MCV: 96.4 FL (ref 80.0–99.0)
MPV: 9.3 FL (ref 8.9–12.9)
Nucleated RBCs: 0 PER 100 WBC
Platelets: 200 10*3/uL (ref 150–400)
RBC: 4.42 M/uL (ref 3.80–5.20)
RDW: 12.5 % (ref 11.5–14.5)
WBC: 10.7 10*3/uL (ref 3.6–11.0)
nRBC: 0 10*3/uL (ref 0.00–0.01)

## 2022-08-04 LAB — POCT GLUCOSE
POC Glucose: 125 mg/dL — ABNORMAL HIGH (ref 65–117)
POC Glucose: 138 mg/dL — ABNORMAL HIGH (ref 65–117)
POC Glucose: 125 mg/dL — ABNORMAL HIGH (ref 65–117)

## 2022-08-04 MED ORDER — LEVETIRACETAM 500 MG/5ML IV SOLN
5005 MG/5ML | Freq: Two times a day (BID) | INTRAVENOUS | Status: DC
Start: 2022-08-04 — End: 2022-08-09
  Administered 2022-08-04 – 2022-08-09 (×9): 1000 mg via INTRAVENOUS

## 2022-08-04 MED ORDER — LEVETIRACETAM 500 MG/5ML IV SOLN
500 MG/5ML | Freq: Once | INTRAVENOUS | Status: AC
Start: 2022-08-04 — End: 2022-08-04
  Administered 2022-08-04: 18:00:00 250 mg via INTRAVENOUS

## 2022-08-04 MED FILL — LEVETIRACETAM 500 MG/5ML IV SOLN: 500 MG/5ML | INTRAVENOUS | Qty: 10

## 2022-08-04 MED FILL — VENLAFAXINE HCL 37.5 MG PO TABS: 37.5 MG | ORAL | Qty: 1

## 2022-08-04 MED FILL — FAMOTIDINE (PF) 20 MG/2ML IV SOLN: 20 MG/2ML | INTRAVENOUS | Qty: 2

## 2022-08-04 MED FILL — ONDANSETRON HCL 4 MG/2ML IJ SOLN: 4 MG/2ML | INTRAMUSCULAR | Qty: 2

## 2022-08-04 MED FILL — LEVETIRACETAM 500 MG/5ML IV SOLN: 500 MG/5ML | INTRAVENOUS | Qty: 5

## 2022-08-04 MED FILL — CEFTRIAXONE SODIUM 1 G IJ SOLR: 1 g | INTRAMUSCULAR | Qty: 1000

## 2022-08-04 NOTE — Plan of Care (Signed)
Problem: Discharge Planning  Goal: Discharge to home or other facility with appropriate resources  08/04/2022 2332 by Michelene Heady, RN  Outcome: Progressing  08/04/2022 1002 by Cyndee Brightly, RN  Outcome: Progressing     Problem: Pain  Goal: Verbalizes/displays adequate comfort level or baseline comfort level  08/04/2022 2332 by Michelene Heady, RN  Outcome: Progressing  08/04/2022 1002 by Cyndee Brightly, RN  Outcome: Progressing     Problem: Skin/Tissue Integrity  Goal: Absence of new skin breakdown  Description: 1.  Monitor for areas of redness and/or skin breakdown  2.  Assess vascular access sites hourly  3.  Every 4-6 hours minimum:  Change oxygen saturation probe site  4.  Every 4-6 hours:  If on nasal continuous positive airway pressure, respiratory therapy assess nares and determine need for appliance change or resting period.  08/04/2022 2332 by Michelene Heady, RN  Outcome: Progressing  08/04/2022 1002 by Cyndee Brightly, RN  Outcome: Progressing     Problem: Safety - Adult  Goal: Free from fall injury  08/04/2022 2332 by Michelene Heady, RN  Outcome: Progressing  08/04/2022 1002 by Cyndee Brightly, RN  Outcome: Progressing     Problem: ABCDS Injury Assessment  Goal: Absence of physical injury  08/04/2022 2332 by Michelene Heady, RN  Outcome: Progressing  08/04/2022 1002 by Cyndee Brightly, RN  Outcome: Progressing     Problem: Safety - Medical Restraint  Goal: Remains free of injury from restraints (Restraint for Interference with Medical Device)  Description: INTERVENTIONS:  1. Determine that other, less restrictive measures have been tried or would not be effective before applying the restraint  2. Evaluate the patient's condition at the time of restraint application  3. Inform patient/family regarding the reason for restraint  4. Q2H: Monitor safety, psychosocial status, comfort, nutrition and hydration  08/04/2022 1002 by Cyndee Brightly, RN  Outcome: Completed

## 2022-08-04 NOTE — Progress Notes (Addendum)
[facility]                                                                                          Hospitalist Progress Note  Teofilo Pod, MD  Office Phone: [phone]        Date of Service:  08/04/2022  NAME:  K[facility]  DOB:  12/12/1956  MRN:  [id number]       Admission Summary:   Kelsey [facility]ins is a 66 y.o. female who presents with seizure like activity from [facility].       66 y.o. 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     She was initially admitted (7/13) due to chest pain, dizziness and shortness of breath at which time she was treated for an NSTEMI and started on Plavix, aspirin, and enoxaparin.  She subsequently developed impaired speech and right-sided weakness at which time a code stroke was called.  Imaging revealed a large left frontal intraparenchymal hemorrhage with extension into the ventricles with convexity subarachnoid blood redistribution with a 6 mm midline shift to the right on (7/14).  There was no CT evidence of aneurysm, AVM, occlusion or dissection.  At that time patient was admitted to the ICU where neurosurgery was consulted and maintained on nicardipine infusion and 3%.  No surgical intervention was necessary, G-tube placed and discharged to inpatient rehab at [facility].         Interval history / Subjective:   Follow up ICH  No new issues overnight     Assessment & Plan:     ICH with mass effect/midline shift  -HOB > 30 degrees, aspiration reactions  -Goal SBP < 140 mmHg, PRN labetalol  -Continue keppra 750 mg Q 12 hours  -Tox screen negative  -3% saline discontinued  -Await EEG  -PT/OT/SLP  -Appreciate neurosurgery, ?signed off  -Appreciate Neurology     Mild leukocytosis: likely reative  History of CAD s/p CABG /recent NSTEMI  ?UTI on ceftriaxone  8/18--8/20  Dysphagia s/p chronic PEG    Addendum:  Reportedly the patient had an episode of witnessed seizures probably lasting 1.5 min  -Keppra increased to 1000mg  IV  -Appreciate discussion with Neuro  -repeat CT head  -Spoke to ICU NP, discussed that we keep the patient in ICU. For simplicity, I will continue to be the attending. ICU NP fine with that        Cardiac diet     Code status: FULL CODE  Prophylaxis: scd    Plan: Monitor closely, PT/OT  Care Plan discussed with: Patient, family  Anticipated Disposition: 1-2 days  Inpatient  Cardiac monitoring: Telemetry         Social Determinants of Health     Tobacco Use: Not on file   Alcohol Use: Not on file   Financial Resource Strain: Not on file   Food Insecurity: Not on file   Transportation Needs: Not on file   Physical Activity:  Not on file   Stress: Not on file   Social Connections: Not on file   Intimate Partner Violence: Not on file   Depression: Not on file   Housing Stability: Not on file       Review of Systems:   Pertinent items are noted in HPI.       Vital Signs:    Last 24hrs VS reviewed since prior progress note. Most recent are:  Vitals:    08/04/22 0400   BP: (!) 131/92   Pulse: 83   Resp: 19   Temp:    SpO2: 95%         Intake/Output Summary (Last 24 hours) at 08/04/2022 0272  Last data filed at 08/04/2022 0000  Gross per 24 hour   Intake 550 ml   Output 950 ml   Net -400 ml        Physical Examination:     I had a face to face encounter with this patient and independently examined them on 08/04/2022 as outlined below:          General : alert x 3, awake, no acute distress,   HEENT: PEERL, EOMI, moist mucus membrane, TM clear  Neck: supple, no JVD, no meningeal signs  Chest: Clear to auscultation bilaterally   CVS: S1 S2 heard, Capillary refill less than 2 seconds  Abd: soft/ non tender, non distended, BS physiological,   Ext: no clubbing, no cyanosis, no edema, brisk 2+ DP pulses  Neuro/Psych: pleasant mood and affect, CN 2-12 grossly intact,  sensory grossly within normal limit, Strength 5/5 in all extremities, DTR 1+ x 4  Skin: warm     Data Review:    Review and/or order of clinical lab test      I have personally and independently reviewed all pertinent labs, diagnostic studies, imaging, and have provided independent interpretation of the same.     Labs:     Recent Labs     08/03/22  0116 08/04/22  0420   WBC 12.4* 10.7   HGB 13.4 14.2   HCT 40.9 42.6   PLT 201 200     Recent Labs     08/02/22  0437 08/02/22  1150 08/03/22  0116 08/03/22  0612 08/04/22  0420   NA 135*   < > 141 142 136   K 4.1   < > 3.7 3.8 3.6   CL 103   < > 110* 110* 104   CO2 25   < > 25 25 28    BUN 9   < > 8 6 10    MG 2.4  --   --  2.0  --    PHOS 1.8*  --   --  3.5  --     < > = values in this interval not displayed.     Recent Labs     08/01/22  1956   ALT 34   GLOB 3.1     Recent Labs     08/02/22  0444   INR 1.0      No results for input(s): TIBC, FERR in the last 72 hours.    Invalid input(s): FE, PSAT   No results found for: FOL, RBCF   No results for input(s): PH, PCO2, PO2 in the last 72 hours.  No results for input(s): CPK in the last 72 hours.    Invalid input(s): CPKMB, CKNDX, TROIQ  Lab Results   Component Value Date/Time    CHOL 121  08/02/2022 04:37 AM    HDL 44 08/02/2022 04:37 AM     No results found for: GLUCPOC  @LABUA @    Notes reviewed from all clinical/nonclinical/nursing services involved in patient's clinical care. Care coordination discussions were held with appropriate clinical/nonclinical/ nursing providers based on care coordination needs.         Patients current active Medications were reviewed, considered, added and adjusted based on the clinical condition today.      Home Medications were reconciled to the best of my ability given all available resources at the time of admission. Route is PO if not otherwise noted.      Admission Status:[id number]:::1}      Medications Reviewed:     Current Facility-Administered Medications   Medication Dose Route  Frequency    cefTRIAXone (ROCEPHIN) 1,000 mg in sterile water 10 mL IV syringe  1,000 mg IntraVENous Q24H    labetalol (NORMODYNE;TRANDATE) injection 10 mg  10 mg IntraVENous Q6H PRN    [Held by provider] 0.9 % sodium chloride infusion   IntraVENous Continuous    famotidine (PEPCID) 20 mg in sodium chloride (PF) 0.9 % 10 mL injection  20 mg IntraVENous BID    insulin lispro (HUMALOG) injection vial 0-8 Units  0-8 Units SubCUTAneous TID WC    insulin lispro (HUMALOG) injection vial 0-4 Units  0-4 Units SubCUTAneous Nightly    glucose chewable tablet 16 g  4 tablet Oral PRN    dextrose bolus 10% 125 mL  125 mL IntraVENous PRN    Or    dextrose bolus 10% 250 mL  250 mL IntraVENous PRN    glucagon injection 1 mg  1 mg SubCUTAneous PRN    dextrose 10 % infusion   IntraVENous Continuous PRN    sodium chloride flush 0.9 % injection 5-40 mL  5-40 mL IntraVENous BID    venlafaxine (EFFEXOR) tablet 37.5 mg  37.5 mg Oral BID WC    levETIRAcetam (KEPPRA) injection 750 mg  750 mg IntraVENous Q12H    sodium chloride flush 0.9 % injection 5-40 mL  5-40 mL IntraVENous 2 times per day    sodium chloride flush 0.9 % injection 5-40 mL  5-40 mL IntraVENous PRN    0.9 % sodium chloride infusion   IntraVENous PRN    acetaminophen (TYLENOL) tablet 650 mg  650 mg Oral Q4H PRN    ondansetron (ZOFRAN) injection 4 mg  4 mg IntraVENous Q6H PRN     ______________________________________________________________________  EXPECTED LENGTH OF STAY: 7  ACTUAL LENGTH OF STAY:          3                 05-27-1984, MD
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
## 2022-08-05 ENCOUNTER — Inpatient Hospital Stay: Admit: 2022-08-05 | Payer: PRIVATE HEALTH INSURANCE | Primary: Internal Medicine

## 2022-08-05 LAB — BASIC METABOLIC PANEL
Anion Gap: 5 mmol/L (ref 5–15)
BUN: 14 MG/DL (ref 6–20)
Bun/Cre Ratio: 29 — ABNORMAL HIGH (ref 12–20)
CO2: 26 mmol/L (ref 21–32)
Calcium: 8.9 MG/DL (ref 8.5–10.1)
Chloride: 106 mmol/L (ref 97–108)
Creatinine: 0.49 MG/DL — ABNORMAL LOW (ref 0.55–1.02)
Est, Glom Filt Rate: >60 mL/min/{1.73_m2} (ref >60)
Glucose: 106 mg/dL — ABNORMAL HIGH (ref 65–100)
Potassium: 3.4 mmol/L — ABNORMAL LOW (ref 3.5–5.1)
Sodium: 137 mmol/L (ref 136–145)

## 2022-08-05 LAB — CBC
Hematocrit: 36.9 % (ref 35.0–47.0)
Hemoglobin: 12.2 g/dL (ref 11.5–16.0)
MCH: 31.7 PG (ref 26.0–34.0)
MCHC: 33.1 g/dL (ref 30.0–36.5)
MCV: 95.8 FL (ref 80.0–99.0)
MPV: 9.3 FL (ref 8.9–12.9)
Nucleated RBCs: 0 PER 100 WBC
Platelets: 162 10*3/uL (ref 150–400)
RBC: 3.85 M/uL (ref 3.80–5.20)
RDW: 12.6 % (ref 11.5–14.5)
WBC: 10.1 10*3/uL (ref 3.6–11.0)
nRBC: 0 10*3/uL (ref 0.00–0.01)

## 2022-08-05 LAB — POCT GLUCOSE
POC Glucose: 114 mg/dL (ref 65–117)
POC Glucose: 119 mg/dL — ABNORMAL HIGH (ref 65–117)
POC Glucose: 127 mg/dL — ABNORMAL HIGH (ref 65–117)
POC Glucose: 98 mg/dL (ref 65–117)

## 2022-08-05 MED ORDER — GUAIFENESIN-DM 100-10 MG/5ML PO SYRP
100-10 MG/5ML | ORAL | Status: AC | PRN
Start: 2022-08-05 — End: 2022-08-09

## 2022-08-05 MED ORDER — TRAZODONE HCL 50 MG PO TABS
50 MG | Freq: Every evening | ORAL | Status: AC | PRN
Start: 2022-08-05 — End: 2022-08-09
  Administered 2022-08-06 – 2022-08-08 (×3): 50 mg via ORAL

## 2022-08-05 MED FILL — FAMOTIDINE (PF) 20 MG/2ML IV SOLN: 20 MG/2ML | INTRAVENOUS | Qty: 2

## 2022-08-05 MED FILL — VENLAFAXINE HCL 37.5 MG PO TABS: 37.5 MG | ORAL | Qty: 1

## 2022-08-05 MED FILL — ACETAMINOPHEN 325 MG PO TABS: 325 MG | ORAL | Qty: 2

## 2022-08-05 MED FILL — CEFTRIAXONE SODIUM 1 G IJ SOLR: 1 g | INTRAMUSCULAR | Qty: 1000

## 2022-08-05 MED FILL — LEVETIRACETAM 500 MG/5ML IV SOLN: 500 MG/5ML | INTRAVENOUS | Qty: 10

## 2022-08-05 NOTE — Care Coordination-Inpatient (Signed)
Transition of Care Plan:    RUR: 10 % low  Prior Level of Functioning:  Needs assistance with ADL's  Disposition: [facility] following   IPR: Date FOC offered: 08/02/22  Date FOC received: 08/02/22  Accepting facility:   DME needed: TBD  Transportation at discharge: BLS  Is patient a Cytogeneticist and connected with [facility]? No  Caregiver Contact:  Spouse Graciela Plato [phone]  Discharge Caregiver contacted prior to discharge? Yes  Care Conference needed? No  Barriers to discharge:    Per notes patient had a possible seizure last night extra dose of Keppra given and scheduled dose increased to 1 gm BID.   Care manager received notification that [facility] is out of network with patient's insurance. CM notified attending and per attending patinet is not medically stable for transfer at this time. CM notified patient's husband and daughter of eventual transfer.  Catarina Hartshorn RN,Care Management

## 2022-08-05 NOTE — Progress Notes (Addendum)
[name]                                                                                          Hospitalist Progress Note  Teofilo Pod, MD  Office Phone: [phone]        Date of Service:  08/05/2022  NAME:  Kelsey Mullins  DOB:  1956/10/01  MRN:  [id number]       Admission Summary:   Kelsey Mullins is a 66 y.o. female who presents with seizure like activity from [facility].       66 y.o. female with a past medical history of CAD status post CABG, HLD, COPD, GERD, ICH 06/2022, who presented to [facility] ED for an episode of unresponsiveness and reported seizure like activity on 8/16.     She was initially admitted (7/13) due to chest pain, dizziness and shortness of breath at which time she was treated for an NSTEMI and started on Plavix, aspirin, and enoxaparin.  She subsequently developed impaired speech and right-sided weakness at which time a code stroke was called.  Imaging revealed a large left frontal intraparenchymal hemorrhage with extension into the ventricles with convexity subarachnoid blood redistribution with a 6 mm midline shift to the right on (7/14).  There was no CT evidence of aneurysm, AVM, occlusion or dissection.  At that time patient was admitted to the ICU where neurosurgery was consulted and maintained on nicardipine infusion and 3%.  No surgical intervention was necessary, G-tube placed and discharged to inpatient rehab at [facility].         Interval history / Subjective:   Follow up ICH  No new issues overnight   Seems quite anxious  Assessment & Plan:     ICH with mass effect/midline shift  -HOB > 30 degrees, aspiration reactions  -Goal SBP < 140 mmHg, PRN labetalol  -Continue keppra 750 mg Q 12 hours  -Tox screen negative  -3% saline discontinued  -Await EEG  -PT/OT/SLP  -Appreciate neurosurgery, ?signed off  -8/19 staring spell witnessed, EEG did not reveal epileptiform discharges  -Repeat CT head unremarkable  -Keppra  increased to 1gm BID  -Appreciate Neurology     Probable anxiety: will consider starting on celexa  Mild leukocytosis: likely reactive--now resolved  History of CAD s/p CABG /recent NSTEMI  ?UTI on ceftriaxone 8/18--8/20  Dysphagia s/p chronic PEG        Cardiac diet     Code status: FULL CODE  Prophylaxis: scd    Plan: Monitor closely, PT/OT  Care Plan discussed with: Patient, family  Anticipated Disposition: 1-2 days  Inpatient  Cardiac monitoring: Telemetry         Social Determinants of Health     Tobacco Use: Not on file   Alcohol Use: Not on file   Financial Resource Strain: Not on file   Food Insecurity: Not on file   Transportation Needs: Not on file   Physical Activity: Not on file   Stress: Not on file   Social Connections: Not on file   Intimate Partner Violence: Not on file   Depression:  Not on file   Housing Stability: Not on file       Review of Systems:   Pertinent items are noted in HPI.       Vital Signs:    Last 24hrs VS reviewed since prior progress note. Most recent are:  Vitals:    08/05/22 0600   BP: 111/82   Pulse: 92   Resp: 23   Temp:    SpO2:          Intake/Output Summary (Last 24 hours) at 08/05/2022 0840  Last data filed at 08/04/2022 1600  Gross per 24 hour   Intake --   Output 300 ml   Net -300 ml          Physical Examination:     I had a face to face encounter with this patient and independently examined them on 08/05/2022 as outlined below:          General : alert x 3, awake, no acute distress,   HEENT: PEERL, EOMI, moist mucus membrane, TM clear  Neck: supple, no JVD, no meningeal signs  Chest: Clear to auscultation bilaterally   CVS: S1 S2 heard, Capillary refill less than 2 seconds  Abd: soft/ non tender, non distended, BS physiological,   Ext: no clubbing, no cyanosis, no edema, brisk 2+ DP pulses  Neuro/Psych: pleasant mood and affect, CN 2-12 grossly intact, sensory grossly within normal limit, Strength 5/5 in all extremities, DTR 1+ x 4  Skin: warm     Data Review:    Review  and/or order of clinical lab test      I have personally and independently reviewed all pertinent labs, diagnostic studies, imaging, and have provided independent interpretation of the same.     Labs:     Recent Labs     08/04/22  0420 08/05/22  0203   WBC 10.7 10.1   HGB 14.2 12.2   HCT 42.6 36.9   PLT 200 162       Recent Labs     08/03/22  0612 08/04/22  0420 08/05/22  0203   NA 142 136 137   K 3.8 3.6 3.4*   CL 110* 104 106   CO2 25 28 26    BUN 6 10 14    MG 2.0  --   --    PHOS 3.5  --   --        No results for input(s): ALT, TP, ALB, GLOB, GGT, AML in the last 72 hours.    Invalid input(s): SGOT, GPT, AP, TBIL, TBILI, AMYP, LPSE, HLPSE    No results for input(s): INR, APTT in the last 72 hours.    Invalid input(s): PTP     No results for input(s): TIBC, FERR in the last 72 hours.    Invalid input(s): FE, PSAT   No results found for: FOL, RBCF   No results for input(s): PH, PCO2, PO2 in the last 72 hours.  No results for input(s): CPK in the last 72 hours.    Invalid input(s): CPKMB, CKNDX, TROIQ  Lab Results   Component Value Date/Time    CHOL 121 08/02/2022 04:37 AM    HDL 44 08/02/2022 04:37 AM     No results found for: GLUCPOC  @LABUA @    Notes reviewed from all clinical/nonclinical/nursing services involved in patient's clinical care. Care coordination discussions were held with appropriate clinical/nonclinical/ nursing providers based on care coordination needs.         Patients current  active Medications were reviewed, considered, added and adjusted based on the clinical condition today.      Home Medications were reconciled to the best of my ability given all available resources at the time of admission. Route is PO if not otherwise noted.      Admission Status:[id number]:::1}      Medications Reviewed:     Current Facility-Administered Medications   Medication Dose Route Frequency    levETIRAcetam (KEPPRA) injection 1,000 mg  1,000 mg IntraVENous Q12H    cefTRIAXone (ROCEPHIN) 1,000 mg in sterile water  10 mL IV syringe  1,000 mg IntraVENous Q24H    labetalol (NORMODYNE;TRANDATE) injection 10 mg  10 mg IntraVENous Q6H PRN    [Held by provider] 0.9 % sodium chloride infusion   IntraVENous Continuous    famotidine (PEPCID) 20 mg in sodium chloride (PF) 0.9 % 10 mL injection  20 mg IntraVENous BID    insulin lispro (HUMALOG) injection vial 0-8 Units  0-8 Units SubCUTAneous TID WC    insulin lispro (HUMALOG) injection vial 0-4 Units  0-4 Units SubCUTAneous Nightly    glucose chewable tablet 16 g  4 tablet Oral PRN    dextrose bolus 10% 125 mL  125 mL IntraVENous PRN    Or    dextrose bolus 10% 250 mL  250 mL IntraVENous PRN    glucagon injection 1 mg  1 mg SubCUTAneous PRN    dextrose 10 % infusion   IntraVENous Continuous PRN    sodium chloride flush 0.9 % injection 5-40 mL  5-40 mL IntraVENous BID    venlafaxine (EFFEXOR) tablet 37.5 mg  37.5 mg Oral BID WC    sodium chloride flush 0.9 % injection 5-40 mL  5-40 mL IntraVENous 2 times per day    sodium chloride flush 0.9 % injection 5-40 mL  5-40 mL IntraVENous PRN    0.9 % sodium chloride infusion   IntraVENous PRN    acetaminophen (TYLENOL) tablet 650 mg  650 mg Oral Q4H PRN    ondansetron (ZOFRAN) injection 4 mg  4 mg IntraVENous Q6H PRN     ______________________________________________________________________  EXPECTED LENGTH OF STAY: 7  ACTUAL LENGTH OF STAY:          4                 Teofilo Pod, MD

## 2022-08-05 NOTE — Consults (Signed)
NeuroInterventional Surgery Consult  Rovena Hearld, APRN - NP    Patient: Kelsey Mullins MRN: [id number]  SSN: [id number]    Date of Birth: [birth year]  Age: 66 y.o.  Sex: female      Chief Complaint: Seizures    HPI:     Norvella Loscalzo is a 66 y.o. female with a PMH of CAD, status post CABG, COPD, HLD, polysubstance abuse (smoking, alcohol, marijuana), and GERD who was admitted on 06/28/2022 to [facility] for NSTEMI, was started on aspirin, Plavix and Lovenox and was treated for potential alcohol withdrawal per review of notes. During that admission, she developed right-sided weakness and difficulty speaking. Code stroke was called and a stat CT of Head revealed a large intracranial hemorrhage in the left frontal lobe with extension into the ventricles and convexity subarachnoid blood distrubution with approx 6 mm shift. CTA negative for vascular source. Antiplatelets and anticoagulation was discontinued due to ICH. Patient had dysphagia and she had a PEG tube placed. She was then discharged to [facility] for Rehab and was reportedly making good progress. She presented to the ED on 08/01/22 via EMS after staff a [facility] witnessed was appeared to be a seizure. CT head was obtained which showed showed an acute intraparenchymal hemorrhage in the left frontal lobe with surrounding cystic change/encephalomalacia overall measuring 5.2 x 3.3 x 5.4 cm with 6 mm shift. Neurosurgery was consulted, no role for surgical intervention. Neurology is following along for seizure mgmt. [facility] has been consulted for diagnostic cerebral angiogram to assess for vascular source of the bleed.     No past medical history on file.  No family history on file.  Social History     Tobacco Use    Smoking status: Not on file    Smokeless tobacco: Not on file   Substance Use Topics    Alcohol use: Not on file      Current Facility-Administered Medications   Medication Dose Route Frequency Provider Last Rate Last Admin     levETIRAcetam (KEPPRA) injection 1,000 mg  1,000 mg IntraVENous Q12H Guadlupe Spanish, APRN - NP   1,000 mg at 08/05/22 0837    labetalol (NORMODYNE;TRANDATE) injection 10 mg  10 mg IntraVENous Q6H PRN Jeanell Sparrow, NP-C        famotidine (PEPCID) 20 mg in sodium chloride (PF) 0.9 % 10 mL injection  20 mg IntraVENous BID Jeanell Sparrow, NP-C   20 mg at 08/05/22 0837    insulin lispro (HUMALOG) injection vial 0-8 Units  0-8 Units SubCUTAneous TID WC Dellyn Lesia Sago, NP-C        insulin lispro (HUMALOG) injection vial 0-4 Units  0-4 Units SubCUTAneous Nightly Dellyn R Matthew, NP-C        glucose chewable tablet 16 g  4 tablet Oral PRN Jeanell Sparrow, NP-C        dextrose bolus 10% 125 mL  125 mL IntraVENous PRN Jeanell Sparrow, NP-C        Or    dextrose bolus 10% 250 mL  250 mL IntraVENous PRN Jeanell Sparrow, NP-C        glucagon injection 1 mg  1 mg SubCUTAneous PRN Jeanell Sparrow, NP-C        dextrose 10 % infusion   IntraVENous Continuous PRN Jeanell Sparrow, NP-C        sodium chloride flush 0.9 % injection 5-40 mL  5-40 mL IntraVENous BID Jeanell Sparrow, NP-C  10 mL at 08/05/22 0839    venlafaxine (EFFEXOR) tablet 37.5 mg  37.5 mg Oral BID WC Larwance Sachs, APRN - CNP   37.5 mg at 08/05/22 1610    sodium chloride flush 0.9 % injection 5-40 mL  5-40 mL IntraVENous 2 times per day Jeanell Sparrow, NP-C   10 mL at 08/05/22 0839    sodium chloride flush 0.9 % injection 5-40 mL  5-40 mL IntraVENous PRN Jeanell Sparrow, NP-C   10 mL at 08/02/22 0316    0.9 % sodium chloride infusion   IntraVENous PRN Jeanell Sparrow, NP-C        acetaminophen (TYLENOL) tablet 650 mg  650 mg Oral Q4H PRN Jeanell Sparrow, NP-C   650 mg at 08/05/22 1135    ondansetron (ZOFRAN) injection 4 mg  4 mg IntraVENous Q6H PRN Jeanell Sparrow, NP-C   4 mg at 08/04/22 1314     Allergies   Allergen Reactions    Crestor [Rosuvastatin]      Unknown      Lipitor [Atorvastatin]      Unknown       Review of Systems:  Meaningful  ROS not obtained due to AMS.     Objective:     Vitals:    08/05/22 1514   BP: 109/87   Pulse: 87   Resp: 18   Temp: 98.8 F (37.1 C)   SpO2: 97%     Physical Exam:  Gen: NAD, calm, cooperative  Skin: Warm, dry, color appropriate for ethnicity.    Neurologic Exam:  Mental Status:               Alert. Confused. Oriented to name and time (able to state month). Disoriented to place and situation. Appropriate affect, mood and behavior.       Speech and Language:                        Speech slow to respond, but able to repeat sentences and name objects. Following some commands.      Cranial Nerves:             PERRL, 3 mm.  Visual fields full to confrontation.  Extraocular movements intact.    Facial sensation intact.  Full facial strength, no asymmetry.   No dysarthria. Tongue protrudes to midline, palate elevates symmetrically.                            Motor:                            No pronator drift. Generalized weakness in BLE, Appears to have equal strength grips in BUE and moving equally.   Bulk and tone normal.   No involuntary movements.     Sensation:                     Sensation intact throughout to light touch.   Parietal function intact.                Coordination:  Deferred      Gait:       Deferred.    Labs:  Recent Results (from the past 12 hour(s))   POCT Glucose    Collection Time: 08/05/22  8:36 AM   Result Value Ref Range    POC Glucose 127 (H)  65 - 117 mg/dL    Performed by: Glenard Haring    POCT Glucose    Collection Time: 08/05/22 11:29 AM   Result Value Ref Range    POC Glucose 119 (H) 65 - 117 mg/dL    Performed by: Glenard Haring       Imaging (my read):  [facility] 08/01/2022: Acute intraparenchymal hemorrhage in the left frontal lobe with surrounding cystic change/encephalomalacia overall measuring 5.2 x 3.3 x 5.4 cm. Surrounding edema and mass effect results in 6 mm of rightward midline shift. It is unclear whether this represents acute rebleeding into a chronic hematoma versus evolution of a  recent acute intraparenchymal hemorrhage as given in clinical  history. Comparison with any prior imaging would be very useful.  [facility] 8//17/2023: Stable in size, but decreased in blood component, mixed blood and CSF density hemorrhage in the left frontal lobe. Stable rightward midline shift and mild mass effect upon the anterior horn of the left lateral ventricle.  MRI of Brain 08/02/2022: Left frontal intraparenchymal hemorrhage with slight increase in size when compared to prior study. Left right midline shift is unchanged. No definite enhancement is noted although evaluation is limited by intrinsic T1 hyperintensity.    Assessment/Plan:     Left Frontal ICH (etiology unclear- potential causes HTN, antiplatelet/anticoagulation use, trauma). ICH score: 1 with seizures   - Pt needs DSA to assess for vascular lesion. Her insurance is out of network and there are plans to tx her to an [facility] hospital. If pt is transferred, please ensure she goes to [facility] where a cerebral angiogram can be performed   - If pt remains at [facility], we will perform DSA, possibly Tuesday   - Neurology following for seizure management    I have discussed the diagnosis and the intended plan as seen in the above orders with Dr. Eliberto Ivory, Dr Alison Murray, and Dr. Maryjean Ka.     Thank you for this consult and participating in the care of this patient.    I have spent 35 minutes of time involved in chart review, lab review, imaging review, consultations with specialists and attendings, family discussion/decision making and documentation.     Signed By: Saddie Benders, APRN - NP, Neurocritical Care Nurse Practitioner    August 05, 2022

## 2022-08-05 NOTE — Plan of Care (Signed)
Problem: Discharge Planning  Goal: Discharge to home or other facility with appropriate resources  Outcome: Progressing  Flowsheets   Discharge to home or other facility with appropriate resources: Identify discharge learning needs (meds, wound care, etc)     Problem: Pain  Goal: Verbalizes/displays adequate comfort level or baseline comfort level  Outcome: Progressing  Flowsheets (Taken 08/05/2022 0800 by Delsa Bern, RN)  Verbalizes/displays adequate comfort level or baseline comfort level:   Encourage patient to monitor pain and request assistance   Implement non-pharmacological measures as appropriate and evaluate response     Problem: Skin/Tissue Integrity  Goal: Absence of new skin breakdown  Description: 1.  Monitor for areas of redness and/or skin breakdown  2.  Assess vascular access sites hourly  3.  Every 4-6 hours minimum:  Change oxygen saturation probe site  4.  Every 4-6 hours:  If on nasal continuous positive airway pressure, respiratory therapy assess nares and determine need for appliance change or resting period.  Outcome: Progressing     Problem: Safety - Adult  Goal: Free from fall injury  Outcome: Progressing     Problem: ABCDS Injury Assessment  Goal: Absence of physical injury  Outcome: Progressing

## 2022-08-05 NOTE — Progress Notes (Signed)
Neurology Staff Addendum:  I have personally seen and examined the patient. I have personally reviewed the chart and images. Elements of my examination included history of present illness, review of systems, review of past medical and surgical history, review of medications, and physical and neurological examination. I have personally reviewed the findings and impressions with the nurse practitioner and am in agreement with their note with changes below.     Pt is a 66yo RH female with h/o MI 06/28/22 treated at [facility], started on ASA, Plavix, and Lovenox, with subsequent large left frontal IPH with IVE with SAH and 80mm shift, CTA negative for etiology. She was d/c'd to [facility]. On 08/01/22 she had a spell of unresponsiveness and staring and was admitted to [facility], [facility] revealed acute IPH in left frontal lobe with edema and mass effect with 93mm shift, underlying encephalomalacia. MRI brain with left frontal IPH, no other chronic microhemorrhages to suggest CAA. She was not on any seizure meds at [facility], stated on Keppra 750mg  bid on admission.  She had another seizure today described as diffuse stiffening, eyes open, staring, with dilated pupils, lasting 1.5 - 2.5 minutes, with minimal post-ictal confusion/non-verbal state, then back to baseline with aphasia and mild right sided weakness. CTH 08/04/22 with stable IPH. Keppra increased to 1000mg  bid. Pt seen with RN at bedside, pt appears uncomfortable, indicates she has a HA, when asked acknowledges vision changes, nausea.  Noted to be anxious overnight and this morning, not sleeping.     Blood pressure 121/74, pulse 96, temperature 98.9 F (37.2 C), temperature source Oral, resp. rate 24, height 1.552 m (5' 1.1"), weight 56.7 kg (125 lb), SpO2 (!) 89 %.    Physical Exam:  General: Well developed well nourished patient in mild distress.   Cardiac: Regular rhythm, tachycardic with no murmurs.   Extremities: 2+ Radial pulses, no cyanosis or edema     Neurological Exam:  Mental  Status: Alert, appropriate, answers yes and no appropriately, limited spontaneous speech or answers to questions, gestures appropriately and has appropriate facial expressions.    Cranial Nerves:   PERRL, EOMI, no nystagmus, no ptosis. Facial movement is symmetric.  Palate is midline. Tongue is midline. Hearing is intact    Motor:  5/5 strength in upper and lower proximal and distal muscles. Normal bulk and tone. No PD. No tremors   Reflexes:      Sensory:      Gait:      Cerebellar:  Intact FTN      A/P:Pt is a 66yo RH female with h/o MI 06/28/22 treated at [facility], started on ASA, Plavix, and Lovenox, with subsequent large left frontal IPH with IVE with SAH and 61mm shift, CTA negative for etiology, d/c'd to SA, with spell on 08/01/22 of unresponsiveness and staring concerning for seizure, CTH revealed acute IPH in left frontal lobe with edema and mass effect with 32mm shift, underlying encephalomalacia. MRI brain with left frontal IPH, no other chronic microhemorrhages to suggest CAA. She was not on any seizure meds at [facility], started on Keppra 750mg  bid on admission, now on Keppra 1000mg  bid after another seizure 08/04/22.  Now with new c/o HA. Noted to be anxious and not sleeping overnight.   -CTH 08/05/22 reviewed, appears stable, report pending.   -Continue Keppra 1000mg  bid.   -Recommend NIS consult for CA to assess for underlying vascular abnormality as etiology for hemorrhage.   -Discussed starting something for sleep/anxiety tonight with Dr.  -PT/OT/ST  Jodi Mourning, MD  [facility] Neurology      Neurology Progress Note  Madelon Lips, APRN - NP    Admit Date: 08/01/2022   LOS: 4 days      Daily Progress Note: 08/05/2022      C/C:     Patient presents with    Seizure-like activity, intracranial hemorrhage.      HPI:   Lalisa Kiehn is a 66 y.o. female with a pmh coronary artery disease, status post CABG, COPD, asthma, tobacco and alcohol use, who was admitted on 06/28/2022 with chest pain. The pt was  transferred from [facility] for elevated troponin, [facility] Patient was started on aspirin, Plavix and Lovenox. Patient was treated for potential alcohol withdrawal. Whilst pt was there, she developed right-sided weakness and difficulty speaking, CT showed large intracranial hemorrhage in the left frontal lobe, patient was seen by tele neurology and Interventional Neurology as well as Neurosurgery. She was started on Cardene drip for blood pressure control, and was given hypertonic saline. Patient had dysphagia and she had a PEG tube placed. Pt was on statin which she had difficulty with the statin thus this was discontinued and alternative medication was started.  She was then dc to [facility] and making good progress and was about to be discharged with a walker. She developed an event where she became unresponsive and apparently was staring. A seizure was suspected and had a CT brain, which showed hemorrhage within the area of infarction in the left frontal lobe.  She was transferred over to [facility] for further management. CTA of the head and neck did not show any aneurysm, AVM, occlusion, or dissection. Reportedly, pt sustained a fall about a week ago. Neurology was consulted for further evaluation. rEEG showed no epileptiform discharges or electrographic seizures. MRI of the brain does not show any new areas of ischemia or underlying structural mass lesions.    SUBJECTIVE:   No acute events noted overnight. The pt downgraded under hospitalist team however, had another episodes of staring thus was given x 1 dose of 250mg  and keppra was incr to I gm bid and rEEG was repeated that showed no electrographic seizure or epileptiform discharges. No sz or unresponsiveness noted or reported.     Allergies   Allergen Reactions    Crestor [Rosuvastatin]      Unknown      Lipitor [Atorvastatin]      Unknown         No past medical history on file.  No family history on  file.  Social History     Tobacco Use    Smoking status: Not on file    Smokeless tobacco: Not on file   Substance Use Topics    Alcohol use: Not on file      None         OBJECTIVES:   Temp (24hrs), Avg:98.8 F (37.1 C), Min:98.2 F (36.8 C), Max:99.2 F (37.3 C)   No intake/output data recorded.  08/18 0701 - 08/19 1900  In: 400 [P.O.:400]  Out: 1250 [Urine:1250]  BP 103/69   Pulse 80   Temp 99.2 F (37.3 C) (Oral)   Resp 21   Ht 5' 1.1" (1.552 m)   Wt 131 lb 13.4 oz (59.8 kg)   SpO2 95%   BMI 24.83 kg/m        Vitals:    08/04/22 2100 08/04/22 2200 08/04/22 2300 08/05/22 0000  BP: 123/88 132/75 (!) 120/107 103/69   Pulse: 86 81 90 80   Resp: 21 17 18 21    Temp:       TempSrc:       SpO2: 95% 95% 95% 95%   Weight:       Height:            Meds:     Current Facility-Administered Medications   Medication Dose Route Frequency    levETIRAcetam (KEPPRA) injection 1,000 mg  1,000 mg IntraVENous Q12H    cefTRIAXone (ROCEPHIN) 1,000 mg in sterile water 10 mL IV syringe  1,000 mg IntraVENous Q24H    labetalol (NORMODYNE;TRANDATE) injection 10 mg  10 mg IntraVENous Q6H PRN    [Held by provider] 0.9 % sodium chloride infusion   IntraVENous Continuous    famotidine (PEPCID) 20 mg in sodium chloride (PF) 0.9 % 10 mL injection  20 mg IntraVENous BID    insulin lispro (HUMALOG) injection vial 0-8 Units  0-8 Units SubCUTAneous TID WC    insulin lispro (HUMALOG) injection vial 0-4 Units  0-4 Units SubCUTAneous Nightly    glucose chewable tablet 16 g  4 tablet Oral PRN    dextrose bolus 10% 125 mL  125 mL IntraVENous PRN    Or    dextrose bolus 10% 250 mL  250 mL IntraVENous PRN    glucagon injection 1 mg  1 mg SubCUTAneous PRN    dextrose 10 % infusion   IntraVENous Continuous PRN    sodium chloride flush 0.9 % injection 5-40 mL  5-40 mL IntraVENous BID    venlafaxine (EFFEXOR) tablet 37.5 mg  37.5 mg Oral BID WC    sodium chloride flush 0.9 % injection 5-40 mL  5-40 mL IntraVENous 2 times per day    sodium chloride  flush 0.9 % injection 5-40 mL  5-40 mL IntraVENous PRN    0.9 % sodium chloride infusion   IntraVENous PRN    acetaminophen (TYLENOL) tablet 650 mg  650 mg Oral Q4H PRN    ondansetron (ZOFRAN) injection 4 mg  4 mg IntraVENous Q6H PRN     I personally reviewed all of the medications    Review of Systems:   No headache, eye, ear nose, throat problems; no coughing or wheezing or shortness of breath, No chest pain or orthopnea, no abdominal pain, nausea or vomiting, No pain in the body or extremities, no psychiatric, neurological, endocrine, hematological or cardiac complaints except as noted above.     Physical Exam:   Blood pressure 103/69, pulse 80, temperature 99.2 F (37.3 C), temperature source Oral, resp. rate 21, height 5' 1.1" (1.552 m), weight 131 lb 13.4 oz (59.8 kg), SpO2 95 %.    GEN: Cooperative with exams, well developed and nourished patient in NAD  HEENT: Normocephalic. Non-icter, no congestion  Lungs:  CTA bilaterally Ant; non-labored breathing, RA  Cardiac: S1,S2, normal rate and rhythm, with no murmurs. no carotid bruits, no gallops  Abdomen: Normal bowel sounds, no distention, soft, non-tender  Extremities: 2+ Radial pulses, no clubbing, cyanosis, or edema  Skin: no rashes or lesions noted      NEURO:  Mental status: Oriented to person, situation and place, disoriented to yr and month. Able to follow commands but slow to response  Cranial Nerves: Forgetful. difficulties with finding words, EOMI, PERRL, 78mm, no nystagmus, no ptosis. Full facial strength, mild asymmetry smile. Hearing intact bilaterally. Tongue protrudes to midline, palate elevates symmetrically. Shrug Shoulders B/L  Motor: Normal  bulk and tone, 5/5 strength x 4 extremities proximally and distally; no involuntary movements.  Coordination: Intact FTN and HTS testing   Reflexes: +2 throughout, down going toes bilaterally   Sensation: Intact x 4 extremities to light touch  Gait:  Deferred     Labs/images:     Lab Results   Component  Value Date    WBC 10.7 08/04/2022    HGB 14.2 08/04/2022    HCT 42.6 08/04/2022    MCV 96.4 08/04/2022    PLT 200 08/04/2022      Lab Results   Component Value Date    NA 136 08/04/2022    K 3.6 08/04/2022    CL 104 08/04/2022    CO2 28 08/04/2022    BUN 10 08/04/2022    CREATININE 0.46 (L) 08/04/2022    GLUCOSE 102 (H) 08/04/2022    CALCIUM 9.5 08/04/2022    PROT 6.8 08/01/2022    LABALBU 3.7 08/01/2022    BILITOT 0.3 08/01/2022    ALKPHOS 64 08/01/2022    AST 21 08/01/2022    ALT 34 08/01/2022    LABGLOM >60 08/04/2022    GLOB 3.1 08/01/2022       CT Results (most recent):    CT HEAD WO CONTRAST 08/04/2022    Narrative  EXAM: CT HEAD WO CONTRAST    INDICATION: assess acute ICH in the setting of recurrent seizure    COMPARISON: CT head 08/02/2022.    CONTRAST: None.    TECHNIQUE: Unenhanced CT of the head was performed using 5 mm images. Brain and  bone windows were generated. Coronal and sagittal reformats. CT dose reduction  was achieved through use of a standardized protocol tailored for this  examination and automatic exposure control for dose modulation.    FINDINGS:  Stable in size 6.0 cm x 4.8 cm x 3.6 cm left frontal intraparenchymal mixed CSF  and blood density hematoma with decreasing 4.5 cm x 1.8 cm acute blood component  (previously 4.9 cm x 2.4 cm). Stable 8mm frontal rightward midline shift. No  transtentorial or transforaminal herniation. No abnormal extra-axial collection.  No CT evidence of acute infarct.    The bone windows demonstrate no abnormalities. The visualized portions of the  paranasal sinuses and mastoid air cells are clear.    Impression  Stable in size left frontal mixed CSF and blood density hematoma, with  decreasing acute blood component. Stable 8 mm anterior rightward midline shift.     MRI Result (most recent):  MRI BRAIN W WO CONTRAST 08/02/2022    Narrative  EXAM:  MRI BRAIN W WO CONTRAST    INDICATION:    ICH    COMPARISON:  08/01/2022.    CONTRAST: 10 ml  ProHance.    TECHNIQUE:  Multiplanar multisequence acquisition without and with contrast of the brain.    FINDINGS:  The ventricles are normal in size and position. Left frontal intraparenchymal  hemorrhage is again noted measuring 6.6 x 4.1 x 6.3 cm with slight increase in  size of in the AP and lateral dimensions. Mass effect on the frontal horn of the  left lateral ventricle with left right midline shift measuring 5 mm not  significant change. There is no cerebellar tonsillar herniation. Expected  arterial flow-voids are present. Evaluation for enhancement is limited by  intrinsic T1 hyperintensity. No definite enhancement is identified.    The paranasal sinuses, mastoid air cells, and middle ears are clear. The orbital  contents are within normal limits.  No significant osseous or scalp lesions are  identified.    Impression  Left frontal intraparenchymal hemorrhage with slight increase in size when  compared to prior study. Left right midline shift is unchanged. No definite  enhancement is noted although evaluation is limited by intrinsic T1  hyperintensity.       Assessment:   Principal Problem:    Intracranial hemorrhage (HCC)  Active Problems:    Altered mental status    Expressive aphasia    Intraparenchymal hematoma of brain (HCC)    Seizure (HCC)  Resolved Problems:    * No resolved hospital problems. *    Plan:   MRIb wwo showed Left frontal intraparenchymal hemorrhage with slight increase in size when compared to prior study. Left right midline shift is unchanged  Repeat CTH on 08/19 showed stable left frontal ICH. Decreasing acute blood component. Stable 8 mm anterior rightward midline shift.   Repeat CTH 08/17 was stable  Continue with Keppra 1 gm bid  Q4hr neuro checks  rEEG showed no electrographic seizures or epileptiform discharges  Continue to hold anti-platelets for now in the settings of acute bleed   PT/OT/ST evals  NIS consult for dsa to assess for underlying vascular abnormality as etiology  for hemorrhage  Low threshold to repeat CT head if any worsening change in neurologic condition       Chart reviewed    Case discussed with: Dr. Alison Murray, Eustace Pen Binford,NP, and primary nurse     >35% time spent in counseling or coordination of care of the above in the assessment and plan     Signed By: Madelon Lips, APRN - NP                    August 05, 2022    Please note that this dictation was completed with Dragon, the computer voice recognition software.  Quite often unanticipated grammatical, syntax, homophones, and other interpretive errors are inadvertently transcribed by the computer software.  Please disregard these errors.  Please excuse any errors that have escaped final proofreading.

## 2022-08-06 LAB — CBC
Hematocrit: 45.8 % (ref 35.0–47.0)
Hemoglobin: 15.3 g/dL (ref 11.5–16.0)
MCH: 31.8 PG (ref 26.0–34.0)
MCHC: 33.4 g/dL (ref 30.0–36.5)
MCV: 95.2 FL (ref 80.0–99.0)
MPV: 9.2 FL (ref 8.9–12.9)
Nucleated RBCs: 0 PER 100 WBC
Platelets: 176 10*3/uL (ref 150–400)
RBC: 4.81 M/uL (ref 3.80–5.20)
RDW: 12.4 % (ref 11.5–14.5)
WBC: 7.4 10*3/uL (ref 3.6–11.0)
nRBC: 0 10*3/uL (ref 0.00–0.01)

## 2022-08-06 LAB — POCT GLUCOSE
POC Glucose: 111 mg/dL (ref 65–117)
POC Glucose: 102 mg/dL (ref 65–117)
POC Glucose: 134 mg/dL — ABNORMAL HIGH (ref 65–117)
POC Glucose: 117 mg/dL (ref 65–117)

## 2022-08-06 LAB — BASIC METABOLIC PANEL
Anion Gap: 7 mmol/L (ref 5–15)
BUN: 13 MG/DL (ref 6–20)
Bun/Cre Ratio: 28 — ABNORMAL HIGH (ref 12–20)
CO2: 25 mmol/L (ref 21–32)
Calcium: 9.4 MG/DL (ref 8.5–10.1)
Chloride: 102 mmol/L (ref 97–108)
Creatinine: 0.46 MG/DL — ABNORMAL LOW (ref 0.55–1.02)
Est, Glom Filt Rate: >60 mL/min/{1.73_m2} (ref >60)
Glucose: 104 mg/dL — ABNORMAL HIGH (ref 65–100)
Potassium: 4 mmol/L (ref 3.5–5.1)
Sodium: 134 mmol/L — ABNORMAL LOW (ref 136–145)

## 2022-08-06 MED ORDER — VENLAFAXINE HCL 37.5 MG PO TABS
37.5 MG | ORAL_TABLET | Freq: Two times a day (BID) | ORAL | 3 refills | Status: AC
Start: 2022-08-06 — End: ?

## 2022-08-06 MED ORDER — LEVETIRACETAM 500 MG/5ML IV SOLN
500 MG/5ML | Freq: Two times a day (BID) | INTRAVENOUS | 0 refills | Status: AC
Start: 2022-08-06 — End: ?

## 2022-08-06 MED ORDER — TRAZODONE HCL 50 MG PO TABS
50 MG | ORAL_TABLET | Freq: Every evening | ORAL | 0 refills | Status: AC | PRN
Start: 2022-08-06 — End: ?

## 2022-08-06 MED FILL — VENLAFAXINE HCL 37.5 MG PO TABS: 37.5 MG | ORAL | Qty: 1

## 2022-08-06 MED FILL — FAMOTIDINE (PF) 20 MG/2ML IV SOLN: 20 MG/2ML | INTRAVENOUS | Qty: 2

## 2022-08-06 MED FILL — TRAZODONE HCL 50 MG PO TABS: 50 MG | ORAL | Qty: 1

## 2022-08-06 MED FILL — LEVETIRACETAM 500 MG/5ML IV SOLN: 500 MG/5ML | INTRAVENOUS | Qty: 10

## 2022-08-06 NOTE — Plan of Care (Signed)
Problem: Discharge Planning  Goal: Discharge to home or other facility with appropriate resources  08/06/2022 2251 by Lenoria Farrier, RN  Outcome: Progressing  08/06/2022 1154 by Paul Dykes, RN  Outcome: Progressing     Problem: Pain  Goal: Verbalizes/displays adequate comfort level or baseline comfort level  08/06/2022 2251 by Lenoria Farrier, RN  Outcome: Progressing  08/06/2022 1154 by Paul Dykes, RN  Outcome: Progressing     Problem: Skin/Tissue Integrity  Goal: Absence of new skin breakdown  Description: 1.  Monitor for areas of redness and/or skin breakdown  2.  Assess vascular access sites hourly  3.  Every 4-6 hours minimum:  Change oxygen saturation probe site  4.  Every 4-6 hours:  If on nasal continuous positive airway pressure, respiratory therapy assess nares and determine need for appliance change or resting period.  Outcome: Progressing     Problem: Safety - Adult  Goal: Free from fall injury  08/06/2022 2251 by Lenoria Farrier, RN  Outcome: Progressing  08/06/2022 1154 by Paul Dykes, RN  Outcome: Progressing     Problem: ABCDS Injury Assessment  Goal: Absence of physical injury  08/06/2022 2251 by Lenoria Farrier, RN  Outcome: Progressing  08/06/2022 1154 by Paul Dykes, RN  Outcome: Progressing

## 2022-08-06 NOTE — Progress Notes (Signed)
Physician Progress Note      PATIENTKAELENE, Kelsey Mullins  CSN #:                  [id number]  DOB:                       21-Jun-1956  ADMIT DATE:       08/01/2022 7:40 PM  DISCH DATE:  RESPONDING  PROVIDER #:        Teofilo Pod MD          QUERY TEXT:    Pt admitted with new ICH.  Pt noted to have radiology finding or documentation   of mass effect and midline shift.  If clinically significant, please document   in progress notes and discharge summary if you are evaluating/treating any of   the following:    The medical record reflects the following:  Risk Factors: 65yo with new ICH (hx of previous ICH)    Clinical Indicators:  8/18 ICU  Edema, mass effect > 6 mm right midline shift. Unclear whether acute   rebleeding into chronic hematoma versus evolution of a recent acute   intraparenchymal hemorrhage as given in clinical history.    Treatment: head CT and MRI, Decadron, Neuro following, EEG, Keppra, Neuro   assessments    Thank you,  Sharyl Nimrod  [phone]  I am also available via [employer].  Options provided:  -- Cerebral edema  -- Brain compression  -- Cerebral edema and Brain compression  -- Other - I will add my own diagnosis  -- Disagree - Not applicable / Not valid  -- Disagree - Clinically unable to determine / Unknown  -- Refer to Clinical Documentation Reviewer    PROVIDER RESPONSE TEXT:    This patient has cerebral edema.    Query created by: Sharyl Nimrod on 08/03/2022 11:02 AM      Electronically signed by:  Teofilo Pod MD 08/06/2022 9:16 AM

## 2022-08-06 NOTE — Care Coordination-Inpatient (Addendum)
Transition of Care Plan:    Transfer to [facility]; pending bed availability   - Dr. Dorthea Cove has accepted Pt for transfer    Transport: BLS required for hospital to hospital transfer    RUR: 10%  Prior Level of Functioning: Independent/required   Disposition: Transfer to [facility] due to insurance  Follow up appointments: Needs IPR  DME needed: defer to IPR  Transportation at discharge: BLS  Caregiver Contact: Spouse Braya Habermehl [phone]  Discharge Caregiver contacted prior to discharge?   Care Conference needed? No  Barriers to discharge: Bed availability at [facility]    Dr. Maryjean Ka initiated transfer tot [facility] as Pt is now stable for transfer, Pt insurance not accepted at [facility].     Pt has been accepted by Dr. Dorthea Cove at [place]. They do not have a bed for Pt currently, but [facility] to contact [address] [facility] station when bed is confirmed.    1524: CM received call from [facility] -  No bed for Pt available at [facility] today.    Lucina Mellow) Barbette Merino, M.S.W.

## 2022-08-06 NOTE — Progress Notes (Signed)
Neurology Progress Note     NAME: Kelsey Mullins   DOB:  24-Oct-1956   MRN:  [id number]   DATE:  08/06/2022    Assessment:     Principal Problem (Resolved):    Intracranial hemorrhage (HCC)  Active Problems:    * No active hospital problems. *  Resolved Problems:    Altered mental status    Expressive aphasia    Intraparenchymal hematoma of brain (HCC)    Seizure [facility])    Patient is a 66 year-old female with history of MI 06/28/22 who was treated at [facility] and started on aspirin, Plavix and Lovenox and subsequently had large L frontal IPH with intraventricular extension and SAD with 6 mm shift. CTA head/neck negative for source. She was discharged to [facility] and then admitted here on 08/01/22 with seizure. MRI brain showed L frontal IPH but no chronic microhemorrhages to suggest CAA. She was not initially discharged on any seizure meds but was placed on Keppra 750 mg BID and then increased dose of 1g BID when after second seizure. Repeat CT head was stable.   Patient is stable and transferred out of ICU yesterday.   Plan:   - Continue Keppra 1000 mg BID   - NIS evaluated patient and plan for angiogram to further evaluate any underlying vascular abnormality,however, will be done at [facility] after transfer due to insurance coverage.    No further recommendations. Please contact if any questions.   Subjective:   Sleeping has improved     Objective:   Chart reviewed since last seen    Current Facility-Administered Medications   Medication Dose Route Frequency    guaiFENesin-dextromethorphan (ROBITUSSIN DM) 100-10 MG/5ML syrup 5 mL  5 mL Oral Q4H PRN    traZODone (DESYREL) tablet 50 mg  50 mg Oral Nightly PRN    levETIRAcetam (KEPPRA) injection 1,000 mg  1,000 mg IntraVENous Q12H    labetalol (NORMODYNE;TRANDATE) injection 10 mg  10 mg IntraVENous Q6H PRN    famotidine  (PEPCID) 20 mg in sodium chloride (PF) 0.9 % 10 mL injection  20 mg IntraVENous BID    insulin lispro (HUMALOG) injection vial 0-8 Units  0-8 Units SubCUTAneous TID WC    insulin lispro (HUMALOG) injection vial 0-4 Units  0-4 Units SubCUTAneous Nightly    glucose chewable tablet 16 g  4 tablet Oral PRN    dextrose bolus 10% 125 mL  125 mL IntraVENous PRN    Or    dextrose bolus 10% 250 mL  250 mL IntraVENous PRN    glucagon injection 1 mg  1 mg SubCUTAneous PRN    dextrose 10 % infusion   IntraVENous Continuous PRN    sodium chloride flush 0.9 % injection 5-40 mL  5-40 mL IntraVENous BID    venlafaxine (EFFEXOR) tablet 37.5 mg  37.5 mg Oral BID WC    sodium chloride flush 0.9 % injection 5-40 mL  5-40 mL IntraVENous 2 times per day    sodium chloride flush 0.9 % injection 5-40 mL  5-40 mL IntraVENous PRN    0.9 % sodium chloride infusion   IntraVENous PRN    acetaminophen (TYLENOL) tablet 650 mg  650 mg Oral Q4H PRN    ondansetron (ZOFRAN) injection 4 mg  4 mg IntraVENous Q6H PRN       BP 120/61   Pulse (!) 104   Temp 99.1 F (37.3 C) (Oral)   Resp 12   Ht 1.552 m (5' 1.1")   Wt 58.3  kg (128 lb 8.5 oz)   SpO2 96%   BMI 24.20 kg/m   Temp (24hrs), Avg:99 F (37.2 C), Min:98.8 F (37.1 C), Max:99.3 F (37.4 C)      No intake/output data recorded.  08/19 1901 - 08/21 0700  In: 250   Out: -       Physical Exam:  General: Well developed well nourished patient in no apparent distress.   Cardiac: Regular rate and rhythm with no murmurs.   Carotids: 2+ symmetric, no bruits  Extremities: 2+ Radial pulses, no cyanosis or edema    Neurological Exam:  Mental Status: A&Ox3. Speech and language intact. Attention and fund of knowledge appropriate.  Normal recent and remote memory.   Cranial Nerves:   VFF, PERRL, EOMI, no nystagmus, no ptosis. Facial sensation is normal. Facial movement is symmetric.  Palate is midline. Tongue is midline. Hearing is intact bilaterally.   Motor:  5/5 strength in upper and lower proximal  and distal muscles. Normal bulk and tone. No PD. No tremors   Reflexes:      Sensory:   Intact to LT and PP   Gait:  Steady routine gait, able to tandem walk.   Cerebellar:           Lab Review   Recent Results (from the past 24 hour(s))   POCT Glucose    Collection Time: 08/05/22 11:29 AM   Result Value Ref Range    POC Glucose 119 (H) 65 - 117 mg/dL    Performed by: Glenard Haring    POCT Glucose    Collection Time: 08/05/22  4:35 PM   Result Value Ref Range    POC Glucose 98 65 - 117 mg/dL    Performed by: Vela Prose    POCT Glucose    Collection Time: 08/05/22 10:06 PM   Result Value Ref Range    POC Glucose 111 65 - 117 mg/dL    Performed by: Larrie Kass    POCT Glucose    Collection Time: 08/06/22  6:05 AM   Result Value Ref Range    POC Glucose 102 65 - 117 mg/dL    Performed by: Larrie Kass    Basic metabolic panel    Collection Time: 08/06/22  6:55 AM   Result Value Ref Range    Sodium 134 (L) 136 - 145 mmol/L    Potassium 4.0 3.5 - 5.1 mmol/L    Chloride 102 97 - 108 mmol/L    CO2 25 21 - 32 mmol/L    Anion Gap 7 5 - 15 mmol/L    Glucose 104 (H) 65 - 100 mg/dL    BUN 13 6 - 20 MG/DL    Creatinine 6.57 (L) 0.55 - 1.02 MG/DL    Bun/Cre Ratio 28 (H) 12 - 20      Est, Glom Filt Rate >60 >60 ml/min/1.90m2    Calcium 9.4 8.5 - 10.1 MG/DL       Imaging Review   MRI Result (most recent):  MRI BRAIN W WO CONTRAST 08/02/2022    Narrative  EXAM:  MRI BRAIN W WO CONTRAST    INDICATION:    ICH    COMPARISON:  08/01/2022.    CONTRAST: 10 ml ProHance.    TECHNIQUE:  Multiplanar multisequence acquisition without and with contrast of the brain.    FINDINGS:  The ventricles are normal in size and position. Left frontal intraparenchymal  hemorrhage is again noted measuring 6.6 x 4.1 x 6.3 cm with  slight increase in  size of in the AP and lateral dimensions. Mass effect on the frontal horn of the  left lateral ventricle with left right midline shift measuring 5 mm not  significant change. There is no cerebellar  tonsillar herniation. Expected  arterial flow-voids are present. Evaluation for enhancement is limited by  intrinsic T1 hyperintensity. No definite enhancement is identified.    The paranasal sinuses, mastoid air cells, and middle ears are clear. The orbital  contents are within normal limits. No significant osseous or scalp lesions are  identified.    Impression  Left frontal intraparenchymal hemorrhage with slight increase in size when  compared to prior study. Left right midline shift is unchanged. No definite  enhancement is noted although evaluation is limited by intrinsic T1  hyperintensity.    CT Result (most recent):  CT HEAD WO CONTRAST 08/05/2022    Narrative  EXAM: CT HEAD WO CONTRAST    INDICATION: severe headache    COMPARISON: CT 08/04/2022.    CONTRAST: None.    TECHNIQUE: Unenhanced CT of the head was performed using 5 mm images. Brain and  bone windows were generated. Coronal and sagittal reformats. CT dose reduction  was achieved through use of a standardized protocol tailored for this  examination and automatic exposure control for dose modulation.    FINDINGS:  Left frontal hematoma with hyperattenuating and hypoattenuating components is  unchanged, measuring up to 5.6 cm AP, 3.9 cm transverse and 4.7 cm craniocaudal  mild mass effect on the frontal horn of left lateral ventricle is unchanged.  Mild rightward subfalcine herniation is again noted measuring up to 7 mm. There  is no interval intracranial hemorrhage. No transtentorial herniation is shown.    The ventricles and sulci are otherwise normal in size, shape and configuration.  There is no significant white matter disease. There is no extra-axial collection  or mass effect. The basilar cisterns are open. No CT evidence of acute infarct.    The bone windows demonstrate no abnormalities. The visualized portions of the  paranasal sinuses and mastoid air cells are clear.    Impression  Stable CT imaging appearance of subacute left frontal  hematoma, with stable mild  right subfalcine herniation.      Care Plan discussed with:  Patient    Family    RN    Care Manager    Consultant/Specialist:       Signed:Shepard Keltz Celene Skeen, APRN - NP

## 2022-08-06 NOTE — Progress Notes (Signed)
Physician Progress Note      PATIENTROBECCA, FULGHAM  CSN #:                  [id number]  DOB:                       May 03, 1956  ADMIT DATE:       08/01/2022 7:40 PM  DISCH DATE:  RESPONDING  PROVIDER #:        Teofilo Pod MD          QUERY TEXT:    Patient admitted with new ICH. Noted documentation of ?UTI on ceftriaxone   8/18--8/20 in PNs on 8/18-8/21. In order to support the diagnosis of UTI,   please include additional clinical indicators in your documentation.  Or   please document if the diagnosis of *** has been ruled out after further   study.    The medical record reflects the following:  Risk Factors: 65yo female    Clinical Indicators:  UA  Nitrite, Urine: Negative  Leukocyte Esterase, Urine: Negative  WBC, UA: 0-4  Epithelial Cells, UA: FEW  Bacteria, UA: Negative    PNs  ?UTI on ceftriaxone 8/18--8/20    Treatment: UA, Ceftriazone    Thank you,  Heather Springer  [phone]  I am also available via [employer].  Options provided:  -- UTI present as evidenced by, Please document evidence.  -- UTI was ruled out  -- Other - I will add my own diagnosis  -- Disagree - Not applicable / Not valid  -- Disagree - Clinically unable to determine / Unknown  -- Refer to Clinical Documentation Reviewer    PROVIDER RESPONSE TEXT:    UTI was ruled out after study.    Query created by: Sharyl Nimrod on 08/06/2022 2:29 PM      Electronically signed by:  Teofilo Pod MD 08/06/2022 8:47 PM

## 2022-08-06 NOTE — Plan of Care (Signed)
Problem: Discharge Planning  Goal: Discharge to home or other facility with appropriate resources  08/06/2022 1154 by Paul Dykes, RN  Outcome: Progressing  08/06/2022 0035 by Erick Alley, RN  Outcome: Progressing  Flowsheets (Taken 08/05/2022 2000)  Discharge to home or other facility with appropriate resources:   Identify barriers to discharge with patient and caregiver   Arrange for needed discharge resources and transportation as appropriate   Identify discharge learning needs (meds, wound care, etc)   Refer to discharge planning if patient needs post-hospital services based on physician order or complex needs related to functional status, cognitive ability or social support system     Problem: Pain  Goal: Verbalizes/displays adequate comfort level or baseline comfort level  08/06/2022 1154 by Paul Dykes, RN  Outcome: Progressing  08/06/2022 0035 by Erick Alley, RN  Outcome: Progressing  Flowsheets (Taken 08/05/2022 2000)  Verbalizes/displays adequate comfort level or baseline comfort level: Assess pain using appropriate pain scale     Problem: Skin/Tissue Integrity  Goal: Absence of new skin breakdown  Description: 1.  Monitor for areas of redness and/or skin breakdown  2.  Assess vascular access sites hourly  3.  Every 4-6 hours minimum:  Change oxygen saturation probe site  4.  Every 4-6 hours:  If on nasal continuous positive airway pressure, respiratory therapy assess nares and determine need for appliance change or resting period.  08/06/2022 0035 by Erick Alley, RN  Outcome: Progressing     Problem: Safety - Adult  Goal: Free from fall injury  08/06/2022 1154 by Paul Dykes, RN  Outcome: Progressing  08/06/2022 0035 by Erick Alley, RN  Outcome: Progressing  Flowsheets (Taken 08/05/2022 2000)  Free From Fall Injury:   Instruct family/caregiver on patient safety   Based on caregiver fall risk screen, instruct family/caregiver to ask for assistance with transferring infant if  caregiver noted to have fall risk factors     Problem: ABCDS Injury Assessment  Goal: Absence of physical injury  08/06/2022 1154 by Paul Dykes, RN  Outcome: Progressing  08/06/2022 0035 by Erick Alley, RN  Outcome: Progressing  Flowsheets (Taken 08/05/2022 2000)  Absence of Physical Injury: Implement safety measures based on patient assessment

## 2022-08-06 NOTE — Progress Notes (Signed)
[name]                                                                                          Hospitalist Progress Note  Teofilo Pod, MD  Office Phone: [phone]        Date of Service:  08/06/2022  NAME:  Kelsey Mullins  DOB:  Feb 13, 1956  MRN:  [id number]       Admission Summary:   Kelsey Mullins is a 66 y.o. female who presents with seizure like activity from [facility].       66 y.o. female with a past medical history of CAD status post CABG, HLD, COPD, GERD, ICH 06/2022, who presented to [facility] ED for an episode of unresponsiveness and reported seizure like activity on 8/16.     She was initially admitted (7/13) due to chest pain, dizziness and shortness of breath at which time she was treated for an NSTEMI and started on Plavix, aspirin, and enoxaparin.  She subsequently developed impaired speech and right-sided weakness at which time a code stroke was called.  Imaging revealed a large left frontal intraparenchymal hemorrhage with extension into the ventricles with convexity subarachnoid blood redistribution with a 6 mm midline shift to the right on (7/14).  There was no CT evidence of aneurysm, AVM, occlusion or dissection.  At that time patient was admitted to the ICU where neurosurgery was consulted and maintained on nicardipine infusion and 3%.  No surgical intervention was necessary, G-tube placed and discharged to inpatient rehab at [facility].         Interval history / Subjective:   Follow up ICH  No new issues overnight   Feels better  Assessment & Plan:     ICH with mass effect/midline shift  -HOB > 30 degrees, aspiration reactions  -Goal SBP < 140 mmHg, PRN labetalol  -Continue keppra 750 mg Q 12 hours  -Tox screen negative  -3% saline discontinued  -Await EEG  -PT/OT/SLP  -Appreciate neurosurgery, ?signed off  -8/19 staring spell witnessed, EEG did not reveal epileptiform discharges  -Repeat CT head unremarkable  -Keppra increased to  1gm BID  -Appreciate Neurology  -Appreciate NIS, needs Cerebral angiogram ans suggested to transfer to [facility]     Probable anxiety: will consider starting on celexa  Mild leukocytosis: likely reactive--now resolved  History of CAD s/p CABG /recent NSTEMI  ?UTI on ceftriaxone 8/18--8/20        Cardiac diet     Code status: FULL CODE  Prophylaxis: scd    Plan: Appreciate discussion with Dr Virgina Organ at [facility], discharge   Care Plan discussed with: Patient, family  Anticipated Disposition: 1-2 days  Inpatient  Cardiac monitoring: Telemetry         Social Determinants of Health     Tobacco Use: Not on file   Alcohol Use: Not on file   Financial Resource Strain: Not on file   Food Insecurity: Not on file   Transportation Needs: Not on file   Physical Activity: Not on file   Stress: Not on file   Social Connections:  Not on file   Intimate Partner Violence: Not on file   Depression: Not on file   Housing Stability: Not on file       Review of Systems:   Pertinent items are noted in HPI.       Vital Signs:    Last 24hrs VS reviewed since prior progress note. Most recent are:  Vitals:    08/06/22 1000   BP:    Pulse: (!) 104   Resp:    Temp:    SpO2:          Intake/Output Summary (Last 24 hours) at 08/06/2022 1021  Last data filed at 08/05/2022 1705  Gross per 24 hour   Intake 250 ml   Output --   Net 250 ml          Physical Examination:     I had a face to face encounter with this patient and independently examined them on 08/06/2022 as outlined below:          General : alert x 3, awake, no acute distress,   HEENT: PEERL, EOMI, moist mucus membrane, TM clear  Neck: supple, no JVD, no meningeal signs  Chest: Clear to auscultation bilaterally   CVS: S1 S2 heard, Capillary refill less than 2 seconds  Abd: soft/ non tender, non distended, BS physiological,   Ext: no clubbing, no cyanosis, no edema, brisk 2+ DP pulses  Neuro/Psych: pleasant mood and affect, CN 2-12 grossly intact, sensory grossly within normal limit,  Strength 5/5 in all extremities, DTR 1+ x 4  Skin: warm     Data Review:    Review and/or order of clinical lab test      I have personally and independently reviewed all pertinent labs, diagnostic studies, imaging, and have provided independent interpretation of the same.     Labs:     Recent Labs     08/04/22  0420 08/05/22  0203   WBC 10.7 10.1   HGB 14.2 12.2   HCT 42.6 36.9   PLT 200 162       Recent Labs     08/04/22  0420 08/05/22  0203 08/06/22  0655   NA 136 137 134*   K 3.6 3.4* 4.0   CL 104 106 102   CO2 28 26 25    BUN 10 14 13        No results for input(s): ALT, TP, ALB, GLOB, GGT, AML in the last 72 hours.    Invalid input(s): SGOT, GPT, AP, TBIL, TBILI, AMYP, LPSE, HLPSE    No results for input(s): INR, APTT in the last 72 hours.    Invalid input(s): PTP     No results for input(s): TIBC, FERR in the last 72 hours.    Invalid input(s): FE, PSAT   No results found for: FOL, RBCF   No results for input(s): PH, PCO2, PO2 in the last 72 hours.  No results for input(s): CPK in the last 72 hours.    Invalid input(s): CPKMB, CKNDX, TROIQ  Lab Results   Component Value Date/Time    CHOL 121 08/02/2022 04:37 AM    HDL 44 08/02/2022 04:37 AM     No results found for: GLUCPOC  @LABUA @    Notes reviewed from all clinical/nonclinical/nursing services involved in patient's clinical care. Care coordination discussions were held with appropriate clinical/nonclinical/ nursing providers based on care coordination needs.         Patients current active Medications were reviewed, considered,  added and adjusted based on the clinical condition today.      Home Medications were reconciled to the best of my ability given all available resources at the time of admission. Route is PO if not otherwise noted.      Admission Status:[id number]:::1}      Medications Reviewed:     Current Facility-Administered Medications   Medication Dose Route Frequency    guaiFENesin-dextromethorphan (ROBITUSSIN DM) 100-10 MG/5ML syrup 5 mL  5 mL  Oral Q4H PRN    traZODone (DESYREL) tablet 50 mg  50 mg Oral Nightly PRN    levETIRAcetam (KEPPRA) injection 1,000 mg  1,000 mg IntraVENous Q12H    labetalol (NORMODYNE;TRANDATE) injection 10 mg  10 mg IntraVENous Q6H PRN    famotidine (PEPCID) 20 mg in sodium chloride (PF) 0.9 % 10 mL injection  20 mg IntraVENous BID    insulin lispro (HUMALOG) injection vial 0-8 Units  0-8 Units SubCUTAneous TID WC    insulin lispro (HUMALOG) injection vial 0-4 Units  0-4 Units SubCUTAneous Nightly    glucose chewable tablet 16 g  4 tablet Oral PRN    dextrose bolus 10% 125 mL  125 mL IntraVENous PRN    Or    dextrose bolus 10% 250 mL  250 mL IntraVENous PRN    glucagon injection 1 mg  1 mg SubCUTAneous PRN    dextrose 10 % infusion   IntraVENous Continuous PRN    sodium chloride flush 0.9 % injection 5-40 mL  5-40 mL IntraVENous BID    venlafaxine (EFFEXOR) tablet 37.5 mg  37.5 mg Oral BID WC    sodium chloride flush 0.9 % injection 5-40 mL  5-40 mL IntraVENous 2 times per day    sodium chloride flush 0.9 % injection 5-40 mL  5-40 mL IntraVENous PRN    0.9 % sodium chloride infusion   IntraVENous PRN    acetaminophen (TYLENOL) tablet 650 mg  650 mg Oral Q4H PRN    ondansetron (ZOFRAN) injection 4 mg  4 mg IntraVENous Q6H PRN     ______________________________________________________________________  EXPECTED LENGTH OF STAY: 7  ACTUAL LENGTH OF STAY:          5                 Teofilo Pod, MD

## 2022-08-06 NOTE — Plan of Care (Signed)
Problem: Discharge Planning  Goal: Discharge to home or other facility with appropriate resources  08/06/2022 0035 by Erick Alley, RN  Outcome: Progressing  Flowsheets (Taken 08/05/2022 2000)  Discharge to home or other facility with appropriate resources:   Identify barriers to discharge with patient and caregiver   Arrange for needed discharge resources and transportation as appropriate   Identify discharge learning needs (meds, wound care, etc)   Refer to discharge planning if patient needs post-hospital services based on physician order or complex needs related to functional status, cognitive ability or social support system  08/05/2022 1714 by Paul Dykes, RN  Outcome: Progressing  Flowsheets (Taken 08/05/2022 0800 by Delsa Bern, RN)  Discharge to home or other facility with appropriate resources: Identify discharge learning needs (meds, wound care, etc)     Problem: Pain  Goal: Verbalizes/displays adequate comfort level or baseline comfort level  08/06/2022 0035 by Erick Alley, RN  Outcome: Progressing  Flowsheets (Taken 08/05/2022 2000)  Verbalizes/displays adequate comfort level or baseline comfort level: Assess pain using appropriate pain scale  08/05/2022 1714 by Paul Dykes, RN  Outcome: Progressing  Flowsheets (Taken 08/05/2022 0800 by Delsa Bern, RN)  Verbalizes/displays adequate comfort level or baseline comfort level:   Encourage patient to monitor pain and request assistance   Implement non-pharmacological measures as appropriate and evaluate response     Problem: Skin/Tissue Integrity  Goal: Absence of new skin breakdown  Description: 1.  Monitor for areas of redness and/or skin breakdown  2.  Assess vascular access sites hourly  3.  Every 4-6 hours minimum:  Change oxygen saturation probe site  4.  Every 4-6 hours:  If on nasal continuous positive airway pressure, respiratory therapy assess nares and determine need for appliance change or resting period.  08/06/2022 0035 by  Erick Alley, RN  Outcome: Progressing  08/05/2022 1714 by Paul Dykes, RN  Outcome: Progressing     Problem: Safety - Adult  Goal: Free from fall injury  08/06/2022 0035 by Erick Alley, RN  Outcome: Progressing  Flowsheets (Taken 08/05/2022 2000)  Free From Fall Injury:   Instruct family/caregiver on patient safety   Based on caregiver fall risk screen, instruct family/caregiver to ask for assistance with transferring infant if caregiver noted to have fall risk factors  08/05/2022 1714 by Paul Dykes, RN  Outcome: Progressing     Problem: ABCDS Injury Assessment  Goal: Absence of physical injury  08/06/2022 0035 by Erick Alley, RN  Outcome: Progressing  Flowsheets (Taken 08/05/2022 2000)  Absence of Physical Injury: Implement safety measures based on patient assessment  08/05/2022 1714 by Paul Dykes, RN  Outcome: Progressing

## 2022-08-07 LAB — BASIC METABOLIC PANEL
Anion Gap: 6 mmol/L (ref 5–15)
BUN: 13 MG/DL (ref 6–20)
Bun/Cre Ratio: 26 — ABNORMAL HIGH (ref 12–20)
CO2: 29 mmol/L (ref 21–32)
Calcium: 9.1 MG/DL (ref 8.5–10.1)
Chloride: 104 mmol/L (ref 97–108)
Creatinine: 0.5 MG/DL — ABNORMAL LOW (ref 0.55–1.02)
Est, Glom Filt Rate: >60 mL/min/{1.73_m2} (ref >60)
Glucose: 99 mg/dL (ref 65–100)
Potassium: 3.8 mmol/L (ref 3.5–5.1)
Sodium: 139 mmol/L (ref 136–145)

## 2022-08-07 LAB — CBC
Hematocrit: 47.4 % — ABNORMAL HIGH (ref 35.0–47.0)
Hemoglobin: 15.6 g/dL (ref 11.5–16.0)
MCH: 31.6 PG (ref 26.0–34.0)
MCHC: 32.9 g/dL (ref 30.0–36.5)
MCV: 96 FL (ref 80.0–99.0)
MPV: 9.3 FL (ref 8.9–12.9)
Nucleated RBCs: 0 PER 100 WBC
Platelets: 166 10*3/uL (ref 150–400)
RBC: 4.94 M/uL (ref 3.80–5.20)
RDW: 12.5 % (ref 11.5–14.5)
WBC: 6.2 10*3/uL (ref 3.6–11.0)
nRBC: 0 10*3/uL (ref 0.00–0.01)

## 2022-08-07 MED FILL — FAMOTIDINE (PF) 20 MG/2ML IV SOLN: 20 MG/2ML | INTRAVENOUS | Qty: 2

## 2022-08-07 MED FILL — LEVETIRACETAM 500 MG/5ML IV SOLN: 500 MG/5ML | INTRAVENOUS | Qty: 10

## 2022-08-07 MED FILL — VENLAFAXINE HCL 37.5 MG PO TABS: 37.5 MG | ORAL | Qty: 1

## 2022-08-07 MED FILL — TRAZODONE HCL 50 MG PO TABS: 50 MG | ORAL | Qty: 1

## 2022-08-07 NOTE — Plan of Care (Signed)
Problem: Discharge Planning  Goal: Discharge to home or other facility with appropriate resources  08/07/2022 2150 by Leane Para, RN  Outcome: Progressing  08/07/2022 1052 by Mingo Oyuki Hogan, RN  Outcome: Progressing     Problem: Pain  Goal: Verbalizes/displays adequate comfort level or baseline comfort level  08/07/2022 2150 by Leane Para, RN  Outcome: Progressing  08/07/2022 1052 by Mingo Skippy Marhefka, RN  Outcome: Progressing     Problem: Skin/Tissue Integrity  Goal: Absence of new skin breakdown  Description: 1.  Monitor for areas of redness and/or skin breakdown  2.  Assess vascular access sites hourly  3.  Every 4-6 hours minimum:  Change oxygen saturation probe site  4.  Every 4-6 hours:  If on nasal continuous positive airway pressure, respiratory therapy assess nares and determine need for appliance change or resting period.  08/07/2022 2150 by Leane Para, RN  Outcome: Progressing  08/07/2022 1052 by Mingo Priyanka Causey, RN  Outcome: Progressing     Problem: Safety - Adult  Goal: Free from fall injury  08/07/2022 2150 by Leane Para, RN  Outcome: Progressing  08/07/2022 1052 by Mingo Sylvanna Burggraf, RN  Outcome: Progressing     Problem: ABCDS Injury Assessment  Goal: Absence of physical injury  08/07/2022 2150 by Leane Para, RN  Outcome: Progressing  08/07/2022 1052 by Mingo Tamanna Whitson, RN  Outcome: Progressing     Problem: Occupational Therapy - Adult  Goal: By Discharge: Performs self-care activities at highest level of function for planned discharge setting.  See evaluation for individualized goals.  Description: FUNCTIONAL STATUS PRIOR TO ADMISSION:  Patient was highly IND at baseline, however recently suffered a L frontal CVA, d/c to IPR where she was set to d/c home with SPV and rollator use with 24/7 family assist/SPV, mild RUE/RLE weakness and aphasia.      HOME SUPPORT: Patient lived with her husband, was planning to d/c home with [facility] therapy, was going to stay with her daughter during the day while her husband is at work.      Occupational Therapy Goals  Initiated 08/02/2022   1.  Patient will perform grooming at the sink with Supervision within 7 day(s).  2.  Patient will perform bathing with Supervision within 7 day(s).  3.  Patient will perform upper and lower body dressing with Supervision within 7 day(s).  4.  Patient will perform toilet transfers with Supervision within 7 day(s).  5.  Patient will perform all aspects of toileting with Supervision within 7 day(s).  6.  Patient will participate in upper extremity therapeutic exercise/activities with Supervision within 7 day(s).    7.  Patient will utilize energy conservation techniques during functional activities with verbal and visual cues within 7 day(s).  8.  Patient will complete the [place] within 7 days.    08/07/2022 1018 by Tawni Millers, OT  Outcome: Progressing     Problem: SLP Adult - Impaired Swallowing  Goal: By Discharge: Advance to least restrictive diet without signs or symptoms of aspiration for planned discharge setting.  See evaluation for individualized goals.  Description: Speech Therapy Goals  Initiated 08/02/22    1. Patient will tolerate baseline diet without adverse effects within 7 days. Goal met 08/07/22.   2. Patient will participate in automatic speech tasks with 50% accuracy independently within 7 days. Goal met 08/07/22.   3. Patient will participate in word retrieval tasks with 50% accuracy independently within 7 days. Goal met 08/07/22.   4. Patient will participate in simple-moderate receptive  language tasks with 60% accuracy within 7 days. Goal met 08/07/22.  5. Patient will participate in moderately complexity naming tasks with 80% acc with min cues within 7 days. Goal initiated 08/07/22.  6. Patient will participate in complex receptive language tasks with 80% acc with min cues within 7 days. Goal initiated 08/07/22.   08/07/2022 1246 by Heber Carolina, SLP  Outcome: Progressing

## 2022-08-07 NOTE — Plan of Care (Addendum)
Speech LAnguage Pathology TREATMENT    Patient: Kelsey Mullins (66 y.o. female)  Date: 08/07/2022  Primary Diagnosis: Intracranial hemorrhage (HCC) [I62.9]  Intraparenchymal hematoma of brain, left, with loss of consciousness of 30 minutes or less, initial encounter ([place]) [P95.093O]     Precautions:  Fall Risk (SBP <140)    ASSESSMENT :  Therapy targeted swallowing and language. Patient motivated to participate and agreeable to therapy session.    Swallow assessed with thin liquids and solids. Oropharyngeal swallow appeared [facility]. No clinical s/s of aspiration or respiratory changes associated with current PO diet. Will sign-off on swallowing.     Ongoing but improving mixed aphasia as compared to initial evaluation. Word retrieval deficits noted for more complex expressive language tasks (e.g., divergent, convergent naming). Compensated with phonemic and semantic cues. Mild auditory comprehension breakdowns for complex receptive language tasks. Benefited from additional processing/response time. Will continue to follow for mixed aphasia.     Patient will benefit from skilled intervention to address the above impairments.     PLAN :  Recommendations and Planned Interventions:  Diet: Regular and thin liquids  -Oral medications whole with liquids/as tolerated  -Aspiration precautions: Upright position, small bites and sips, alternate solids/liquids, intermittently clear throat  -Routine oral care 2-3 times daily   -Provide sematic/phonemic cues or binary choices to improve naming/word finding  -Provide additional time, use simple language, and provide visual context to improve comprehension    Acute SLP Services: Yes, SLP will continue to follow per plan of care.    Discharge Recommendations: Yes, recommend SLP treatment at next level of care     SUBJECTIVE:   Patient stated, "water" when given open-ended divergent naming task.    OBJECTIVE:   No past medical history on file.No past surgical history on  file.    Baseline Assessment:  Current Diet: Regular texture with thin liquids    Cognitive and Communication Status:  Neurologic State: Alert  Orientation Level: Oriented x4  Cognition: Assessment ongoing 2/2 mixed aphasia    Dysphagia:  P.O. Trials:  Assessment Method: Observation  Patient Position: Upright in bed  Vocal Quality: [facility] Breathy Rough Strained Asthenic  Consistency Presented: Thin, Ice Chips, Purees, Solids  How Presented: SLP-fed/presented via spoon, cup, straw  Bolus Acceptance: No impairment  Bolus Formation/Control: Impaired / No impairment  Type of Impairment: Anterior spillage, Prolonged mastication  Propulsion: Delayed, Discoordination / No impairment  Oral Residue: None  Laryngeal Elevation: Functional  Aspiration Signs/Symptoms: None, Strong cough, Delayed throat clear    Speech/Language:   Verbal expression: Impaired  Repetition: 100%  Confrontation and Responsive Naming: 90%  Divergent Naming: Named 1 item in open-ended category in 1 minute  Convergent Naming: 50%  Auditory Comprehension: Impaired  1-step commands: 100%  2-step commands: 80%  Basic Y/N responses: 100%   Complex Y/N responses: 70%    Respiratory Status/Airway:  Room air    The NOMS functional outcome measure was used to quantify this patient's level of swallowing impairment.  Based on the NOMS, the patient was determined to be at level 7 for swallow function.  NOMS Swallowing Levels:  Level 1 (CN): NPO  Level 2 (CM): NPO but takes consistency in therapy  Level 3 (CL): Takes less than 50% of nutrition p.o. and continues with nonoral feedings; and/or safe with mod cues; and/or max diet restriction  Level 4 (CK): Safe swallow but needs mod cues; and/or mod diet restriction; and/or still requires some nonoral feeding/supplements  Level 5 (CJ): Safe swallow with  min diet restriction; and/or needs min cues  Level 6 (CI): Independent with p.o.; rare cues; usually self cues; may need to avoid some foods or needs extra time  Level 7  (Nitro): Independent for all p.o.  ASHA. (2003). National Outcomes Measurement System (NOMS): Adult Speech-Language Pathology User's Guide.     After treatment:   Patient left in no apparent distress in bed, Call bell left within reach, and Caregiver present (daughter)    COMMUNICATION/EDUCATION:   The patient's plan of care including recommendations, planned interventions, and recommended language and swallow strategies were discussed with: patient and her daughter.    Thank you,  Heber Carolina, SLP  Minutes: 20     Problem: SLP Adult - Impaired Swallowing  Goal: By Discharge: Advance to least restrictive diet without signs or symptoms of aspiration for planned discharge setting.  See evaluation for individualized goals.  Description: Speech Therapy Goals  Initiated 08/02/22  1. Patient will tolerate baseline diet without adverse effects within 7 days. Goal met 08/07/22.   2. Patient will participate in automatic speech tasks with 50% accuracy independently within 7 days. Goal met 08/07/22.   3. Patient will participate in word retrieval tasks with 50% accuracy independently within 7 days. Goal met 08/07/22.   4. Patient will participate in simple-moderate receptive language tasks with 60% accuracy within 7 days. Goal met 08/07/22.  5. Patient will participate in moderately complexity naming tasks with 80% acc with min cues within 7 days. Goal initiated 08/07/22.  6. Patient will participate in complex receptive language tasks with 80% acc with min cues within 7 days. Goal initiated 08/07/22.   Outcome: Progressing

## 2022-08-07 NOTE — Plan of Care (Signed)
Problem: Occupational Therapy - Adult  Goal: By Discharge: Performs self-care activities at highest level of function for planned discharge setting.  See evaluation for individualized goals.  Description: FUNCTIONAL STATUS PRIOR TO ADMISSION:  Patient was highly IND at baseline, however recently suffered a L frontal CVA, d/c to IPR where she was set to d/c home with SPV and rollator use with 24/7 family assist/SPV, mild RUE/RLE weakness and aphasia.      HOME SUPPORT: Patient lived with her husband, was planning to d/c home with [facility] therapy, was going to stay with her daughter during the day while her husband is at work.     Occupational Therapy Goals  Initiated 08/02/2022   1.  Patient will perform grooming at the sink with Supervision within 7 day(s).  2.  Patient will perform bathing with Supervision within 7 day(s).  3.  Patient will perform upper and lower body dressing with Supervision within 7 day(s).  4.  Patient will perform toilet transfers with Supervision within 7 day(s).  5.  Patient will perform all aspects of toileting with Supervision within 7 day(s).  6.  Patient will participate in upper extremity therapeutic exercise/activities with Supervision within 7 day(s).    7.  Patient will utilize energy conservation techniques during functional activities with verbal and visual cues within 7 day(s).  8.  Patient will complete the [place] within 7 days.    Outcome: Progressing     OCCUPATIONAL THERAPY TREATMENT  Patient: Kelsey Mullins (66 y.o. female)  Date: 08/07/2022  Primary Diagnosis: Intracranial hemorrhage (HCC) [I62.9]  Intraparenchymal hematoma of brain, left, with loss of consciousness of 30 minutes or less, initial encounter [facility]) [E45.409W]       Precautions: Fall Risk (SBP <140)                Chart, occupational therapy assessment, plan of care, and goals were reviewed.    ASSESSMENT  Patient continues to benefit from skilled OT services and is progressing towards goals however remains  limited by decreased sitting>standing balance, RUE/RLE function, attention & visual scanning to the R, coordination (FM>GM), cognition (attention, sequencing, safety, processing, insight, problem solving, judgment), language, and activity tolerance with bathroom mobility, toileting, grooming, and lower body dressing completed this session. She remains below her baseline, requiring constant Min-Mod A to maintain balance with all standing tasks d/t R lateral lean and inattention to the R. Min-mod cues required for safety and sequencing with rote basic ADLs, fair carryover, worsening with fatigue, noted to be tachycardic (HR up to 126) upon return to the chair, VSS. She remains a good candidate to return to IPR, making steady gains however below her functional baseline.         PLAN :  Patient continues to benefit from skilled intervention to address the above impairments.  Continue treatment per established plan of care to address goals.    Recommend with staff: Recommend with nursing, ADLs with assist, OOB to chair 3x/day, and toileting via functional mobility to and from bathroom with RW support. Thank you for completing as able in order to maintain patient strength, endurance and independence.     Recommendation for discharge: (in order for the patient to meet his/her long term goals): Therapy 3 hours/day 5-7 days/week    Other factors to consider for discharge: poor safety awareness, impaired cognition, high risk for falls, not safe to be alone, and concern for safely navigating or managing the home environment    IF patient discharges home will  need the following DME: continuing to assess with progress       SUBJECTIVE:   Patient stated "I'm going to . . . try to brush my  . . teeth." increased time to complete thoughts/sentences    OBJECTIVE DATA SUMMARY:   Cognitive/Behavioral Status:     Cognition  Overall Cognitive Status: Exceptions  Arousal/Alertness: Appropriate responses to stimuli;Delayed responses to  stimuli  Following Commands: Follows one step commands with increased time;Follows one step commands with repetition  Attention Span: Attends with cues to redirect;Difficulty dividing attention  Memory: Decreased recall of biographical Information;Decreased recall of precautions;Decreased short term memory;Decreased long term memory;Decreased recall of recent events  Safety Judgement: Decreased awareness of need for assistance;Decreased awareness of need for safety  Problem Solving: Assistance required to generate solutions;Assistance required to implement solutions;Assistance required to identify errors made;Assistance required to correct errors made;Decreased awareness of errors  Insights: Decreased awareness of deficits  Initiation: Requires cues for some  Sequencing: Requires cues for some    Functional Mobility and Transfers for ADLs:  Bed Mobility:  Bed Mobility Training  Bed Mobility Training: Yes  Overall Level of Assistance: Stand-by assistance  Interventions: Verbal cues;Visual cues  Supine to Sit: Stand-by assistance  Sit to Supine:  (in recliner)  Scooting: Stand-by assistance     Transfers:   Art therapist: Yes  Overall Level of Assistance: Minimum assistance  Interventions: Manual cues;Safety awareness training;Tactile cues;Verbal cues;Visual cues;Weight shifting training/pressure relief  Sit to Stand: Minimum assistance  Stand to Sit: Minimum assistance  Toilet Transfer: Minimum assistance;Adaptive equipment (on/off toilet, R lateral lean; Min-Mod A for bathroom mobility with RW (increased with fatigue))      Balance:  Standing: Impaired  Balance  Sitting: Impaired  Sitting - Static: Good (unsupported)  Sitting - Dynamic: Fair (occasional)  Standing: Impaired  Standing - Static: Fair;Constant support  Standing - Dynamic: Poor;Constant support      ADL Intervention:  Grooming: Moderate assistance   Grooming Skilled Clinical Factors: standing at the sink, R lateral & posterior  lean with all balance at the sink, A for FM coordination of RUE with tool manipulation    LE Dressing: Minimal assistance  LE Dressing Skilled Clinical Factors: A to doff sock with RUE d/t decreased FM strength & coordination, A for standing balance to doff/don briefs    Toileting: Moderate assistance  Toileting Skilled Clinical Factors: Min A for transfer on/off with grab bar, increased A for standing balance with donning clean brief      Intervention/Education specific to: "Stroke diagnoses"    Patient was educated regarding her deficit(s) above as this relates to her diagnosis of ICH.  She demonstrated  fair  understanding as evidenced by verbal discussion, fair carryover.    Pain Rating:  None reported/10   Pain Intervention(s):   nursing notified      Activity Tolerance:   Good despite tachycardia  Please refer to the flowsheet for vital signs taken during this treatment.    After treatment:   Patient left in no apparent distress sitting up in chair, Call bell within reach, and Bed/ chair alarm activated    COMMUNICATION/EDUCATION:   The patient's plan of care was discussed with: physical therapist and registered nurse    Patient Education  Education Given To: Patient  Education Provided: Role of Therapy;Plan of Care;Precautions;ADL Adaptive Strategies;Transfer Training;Energy Conservation;Orientation;Family Education;Fall Prevention Strategies  Education Method: Demonstration;Verbal;Teach Back  Barriers to Learning: Cognition  Education Outcome: Continued education needed;Verbalized understanding  Thank you for this referral.  Tawni Millers, OTD, OTR/L  Minutes: 16

## 2022-08-07 NOTE — Plan of Care (Signed)
Problem: Discharge Planning  Goal: Discharge to home or other facility with appropriate resources  08/07/2022 1052 by Cheri Rous, RN  Outcome: Progressing  08/06/2022 2251 by Lenoria Farrier, RN  Outcome: Progressing     Problem: Pain  Goal: Verbalizes/displays adequate comfort level or baseline comfort level  08/07/2022 1052 by Cheri Rous, RN  Outcome: Progressing  08/06/2022 2251 by Lenoria Farrier, RN  Outcome: Progressing     Problem: Skin/Tissue Integrity  Goal: Absence of new skin breakdown  Description: 1.  Monitor for areas of redness and/or skin breakdown  2.  Assess vascular access sites hourly  3.  Every 4-6 hours minimum:  Change oxygen saturation probe site  4.  Every 4-6 hours:  If on nasal continuous positive airway pressure, respiratory therapy assess nares and determine need for appliance change or resting period.  08/07/2022 1052 by Cheri Rous, RN  Outcome: Progressing  08/06/2022 2251 by Lenoria Farrier, RN  Outcome: Progressing     Problem: Safety - Adult  Goal: Free from fall injury  08/07/2022 1052 by Cheri Rous, RN  Outcome: Progressing  08/06/2022 2251 by Lenoria Farrier, RN  Outcome: Progressing     Problem: ABCDS Injury Assessment  Goal: Absence of physical injury  08/07/2022 1052 by Cheri Rous, RN  Outcome: Progressing  08/06/2022 2251 by Lenoria Farrier, RN  Outcome: Progressing     Problem: Occupational Therapy - Adult  Goal: By Discharge: Performs self-care activities at highest level of function for planned discharge setting.  See evaluation for individualized goals.  Description: FUNCTIONAL STATUS PRIOR TO ADMISSION:  Patient was highly IND at baseline, however recently suffered a L frontal CVA, d/c to IPR where she was set to d/c home with SPV and rollator use with 24/7 family assist/SPV, mild RUE/RLE weakness and aphasia.      HOME SUPPORT: Patient lived with her husband, was planning to d/c home with [facility] therapy, was going to stay with her daughter during the day while her husband is at work.      Occupational Therapy Goals  Initiated 08/02/2022   1.  Patient will perform grooming at the sink with Supervision within 7 day(s).  2.  Patient will perform bathing with Supervision within 7 day(s).  3.  Patient will perform upper and lower body dressing with Supervision within 7 day(s).  4.  Patient will perform toilet transfers with Supervision within 7 day(s).  5.  Patient will perform all aspects of toileting with Supervision within 7 day(s).  6.  Patient will participate in upper extremity therapeutic exercise/activities with Supervision within 7 day(s).    7.  Patient will utilize energy conservation techniques during functional activities with verbal and visual cues within 7 day(s).  8.  Patient will complete the [place] within 7 days.    08/07/2022 1018 by Daine Floras, OT  Outcome: Progressing

## 2022-08-07 NOTE — Care Coordination-Inpatient (Signed)
Transition of Care Plan:    Transfer to [facility]; pending bed availability   - Dr. Dorthea Cove has accepted Pt for transfer    Transport: BLS required for hospital to hospital transfer    RUR: 10%  Prior Level of Functioning: Independent/required   Disposition: Transfer to [facility] due to insurance  Follow up appointments: Needs IPR  DME needed: defer to IPR  Transportation at discharge: BLS  Caregiver Contact: Spouse Lexis Potenza [phone]  Discharge Caregiver contacted prior to discharge?   Care Conference needed? No  Barriers to discharge: Bed availability at [facility]    1510: CM called [facility] -  No bed for Pt available at [facility] at this time.    Lucina Mellow) Barbette Merino, M.S.W.

## 2022-08-07 NOTE — Progress Notes (Signed)
[name]                                                                                          Hospitalist Progress Note  Teofilo Pod, MD  Office Phone: [phone]        Date of Service:  08/07/2022  NAME:  Kelsey Mullins  DOB:  [birth year]  MRN:  [id number]       Admission Summary:   Kelsey Mullins is a 66 y.o. female who presents with seizure like activity from [facility].       66 y.o. female with a past medical history of CAD status post CABG, HLD, COPD, GERD, ICH 06/2022, who presented to [facility] ED for an episode of unresponsiveness and reported seizure like activity on 8/16.     She was initially admitted (7/13) due to chest pain, dizziness and shortness of breath at which time she was treated for an NSTEMI and started on Plavix, aspirin, and enoxaparin.  She subsequently developed impaired speech and right-sided weakness at which time a code stroke was called.  Imaging revealed a large left frontal intraparenchymal hemorrhage with extension into the ventricles with convexity subarachnoid blood redistribution with a 6 mm midline shift to the right on (7/14).  There was no CT evidence of aneurysm, AVM, occlusion or dissection.  At that time patient was admitted to the ICU where neurosurgery was consulted and maintained on nicardipine infusion and 3%.  No surgical intervention was necessary, G-tube placed and discharged to inpatient rehab at [facility].         Interval history / Subjective:   Follow up ICH  No new issues overnight  Had slight headache earlier but now resolved   Feels better  Assessment & Plan:     ICH with mass effect/midline shift  -HOB > 30 degrees, aspiration reactions  -Goal SBP < 140 mmHg, PRN labetalol  -Continue keppra 750 mg Q 12 hours  -Tox screen negative  -3% saline discontinued  -PT/OT/SLP  -Appreciate neurosurgery, ?signed off  -8/19 staring spell witnessed, EEG did not reveal epileptiform discharges  -Repeat CT head  unremarkable  -Keppra increased to 1gm BID  -Appreciate Neurology  -Appreciate NIS, needs Cerebral angiogram ans suggested to transfer to [facility]     Probable anxiety: will consider starting on celexa  Mild leukocytosis: likely reactive--now resolved  History of CAD s/p CABG /recent NSTEMI  ?UTI on ceftriaxone 8/18--8/20        Cardiac diet     Code status: FULL CODE  Prophylaxis: scd    Plan: Awaiting transfer to [facility] Plan discussed with: Patient, family  Anticipated Disposition: ?today  Inpatient  Cardiac monitoring: Telemetry         Social Determinants of Health     Tobacco Use: Not on file   Alcohol Use: Not on file   Financial Resource Strain: Not on file   Food Insecurity: Not on file   Transportation Needs: Not on file   Physical Activity: Not on file   Stress: Not on file   Social Connections: Not  on file   Intimate Partner Violence: Not on file   Depression: Not on file   Housing Stability: Not on file       Review of Systems:   Pertinent items are noted in HPI.       Vital Signs:    Last 24hrs VS reviewed since prior progress note. Most recent are:  Vitals:    08/07/22 1015   BP: 123/76   Pulse: (!) 123   Resp: 14   Temp: 98.5 F (36.9 C)   SpO2:          Intake/Output Summary (Last 24 hours) at 08/07/2022 1221  Last data filed at 08/06/2022 2000  Gross per 24 hour   Intake 100 ml   Output --   Net 100 ml          Physical Examination:     I had a face to face encounter with this patient and independently examined them on 08/07/2022 as outlined below:          General : alert x 3, awake, no acute distress,   HEENT: PEERL, EOMI, moist mucus membrane, TM clear  Neck: supple, no JVD, no meningeal signs  Chest: Clear to auscultation bilaterally   CVS: S1 S2 heard, Capillary refill less than 2 seconds  Abd: soft/ non tender, non distended, BS physiological,   Ext: no clubbing, no cyanosis, no edema, brisk 2+ DP pulses  Neuro/Psych: pleasant mood and affect, CN 2-12 grossly intact, sensory  grossly within normal limit, Strength 5/5 in all extremities, DTR 1+ x 4  Skin: warm     Data Review:    Review and/or order of clinical lab test      I have personally and independently reviewed all pertinent labs, diagnostic studies, imaging, and have provided independent interpretation of the same.     Labs:     Recent Labs     08/06/22  0655 08/07/22  0531   WBC 7.4 6.2   HGB 15.3 15.6   HCT 45.8 47.4*   PLT 176 166       Recent Labs     08/05/22  0203 08/06/22  0655 08/07/22  0531   NA 137 134* 139   K 3.4* 4.0 3.8   CL 106 102 104   CO2 26 25 29    BUN 14 13 13        No results for input(s): ALT, TP, ALB, GLOB, GGT, AML in the last 72 hours.    Invalid input(s): SGOT, GPT, AP, TBIL, TBILI, AMYP, LPSE, HLPSE    No results for input(s): INR, APTT in the last 72 hours.    Invalid input(s): PTP     No results for input(s): TIBC, FERR in the last 72 hours.    Invalid input(s): FE, PSAT   No results found for: FOL, RBCF   No results for input(s): PH, PCO2, PO2 in the last 72 hours.  No results for input(s): CPK in the last 72 hours.    Invalid input(s): CPKMB, CKNDX, TROIQ  Lab Results   Component Value Date/Time    CHOL 121 08/02/2022 04:37 AM    HDL 44 08/02/2022 04:37 AM     No results found for: GLUCPOC  @LABUA @    Notes reviewed from all clinical/nonclinical/nursing services involved in patient's clinical care. Care coordination discussions were held with appropriate clinical/nonclinical/ nursing providers based on care coordination needs.         Patients current active Medications were  reviewed, considered, added and adjusted based on the clinical condition today.      Home Medications were reconciled to the best of my ability given all available resources at the time of admission. Route is PO if not otherwise noted.      Admission Status:[id number]:::1}      Medications Reviewed:     Current Facility-Administered Medications   Medication Dose Route Frequency    guaiFENesin-dextromethorphan (ROBITUSSIN DM)  100-10 MG/5ML syrup 5 mL  5 mL Oral Q4H PRN    traZODone (DESYREL) tablet 50 mg  50 mg Oral Nightly PRN    levETIRAcetam (KEPPRA) injection 1,000 mg  1,000 mg IntraVENous Q12H    labetalol (NORMODYNE;TRANDATE) injection 10 mg  10 mg IntraVENous Q6H PRN    famotidine (PEPCID) 20 mg in sodium chloride (PF) 0.9 % 10 mL injection  20 mg IntraVENous BID    glucose chewable tablet 16 g  4 tablet Oral PRN    dextrose bolus 10% 125 mL  125 mL IntraVENous PRN    Or    dextrose bolus 10% 250 mL  250 mL IntraVENous PRN    glucagon injection 1 mg  1 mg SubCUTAneous PRN    dextrose 10 % infusion   IntraVENous Continuous PRN    sodium chloride flush 0.9 % injection 5-40 mL  5-40 mL IntraVENous BID    venlafaxine (EFFEXOR) tablet 37.5 mg  37.5 mg Oral BID WC    sodium chloride flush 0.9 % injection 5-40 mL  5-40 mL IntraVENous 2 times per day    sodium chloride flush 0.9 % injection 5-40 mL  5-40 mL IntraVENous PRN    0.9 % sodium chloride infusion   IntraVENous PRN    acetaminophen (TYLENOL) tablet 650 mg  650 mg Oral Q4H PRN    ondansetron (ZOFRAN) injection 4 mg  4 mg IntraVENous Q6H PRN     ______________________________________________________________________  EXPECTED LENGTH OF STAY: 5  ACTUAL LENGTH OF STAY:          6                 Teofilo Pod, MD

## 2022-08-08 LAB — CBC
Hematocrit: 46.1 % (ref 35.0–47.0)
Hemoglobin: 15.1 g/dL (ref 11.5–16.0)
MCH: 31.8 PG (ref 26.0–34.0)
MCHC: 32.8 g/dL (ref 30.0–36.5)
MCV: 97.1 FL (ref 80.0–99.0)
MPV: 9.4 FL (ref 8.9–12.9)
Nucleated RBCs: 0 PER 100 WBC
Platelets: 187 10*3/uL (ref 150–400)
RBC: 4.75 M/uL (ref 3.80–5.20)
RDW: 12.6 % (ref 11.5–14.5)
WBC: 5.2 10*3/uL (ref 3.6–11.0)
nRBC: 0 10*3/uL (ref 0.00–0.01)

## 2022-08-08 LAB — BASIC METABOLIC PANEL
Anion Gap: 6 mmol/L (ref 5–15)
BUN: 13 MG/DL (ref 6–20)
Bun/Cre Ratio: 25 — ABNORMAL HIGH (ref 12–20)
CO2: 27 mmol/L (ref 21–32)
Calcium: 9.5 MG/DL (ref 8.5–10.1)
Chloride: 106 mmol/L (ref 97–108)
Creatinine: 0.53 MG/DL — ABNORMAL LOW (ref 0.55–1.02)
Est, Glom Filt Rate: >60 mL/min/{1.73_m2} (ref >60)
Glucose: 101 mg/dL — ABNORMAL HIGH (ref 65–100)
Potassium: 3.9 mmol/L (ref 3.5–5.1)
Sodium: 139 mmol/L (ref 136–145)

## 2022-08-08 LAB — CULTURE, BLOOD 2: Culture: NO GROWTH

## 2022-08-08 LAB — CULTURE, BLOOD 1: Culture: NO GROWTH

## 2022-08-08 MED FILL — LEVETIRACETAM 500 MG/5ML IV SOLN: 500 MG/5ML | INTRAVENOUS | Qty: 10

## 2022-08-08 MED FILL — FAMOTIDINE (PF) 20 MG/2ML IV SOLN: 20 MG/2ML | INTRAVENOUS | Qty: 2

## 2022-08-08 MED FILL — VENLAFAXINE HCL 37.5 MG PO TABS: 37.5 MG | ORAL | Qty: 1

## 2022-08-08 MED FILL — ACETAMINOPHEN 325 MG PO TABS: 325 MG | ORAL | Qty: 2

## 2022-08-08 MED FILL — TRAZODONE HCL 50 MG PO TABS: 50 MG | ORAL | Qty: 1

## 2022-08-08 NOTE — Care Coordination-Inpatient (Addendum)
Transition of Care Plan:    Transfer to [facility]; Today  - Dr. Dorthea Cove has accepted Pt for transfer  RN to call report to [phone]; Room #214    EMTALA portion completed, RN to print.    Transport: [employer] to arrange BLS transport and call RN station once confirmed.    RUR: 10%  Prior Level of Functioning: Independent/required   Disposition: Transfer to [facility] due to insurance  Follow up appointments: Needs IPR  DME needed: defer to IPR  Transportation at discharge: BLS  Caregiver Contact: Spouse Leanza Shepperson [phone]  Discharge Caregiver contacted prior to discharge?   Care Conference needed? No  Barriers to discharge: Bed availability at [facility]    1530: CM received call from [facility] -  No bed for Pt available at [facility] at this time.    1633: Pt has received ben at [facility] for transfer. [facility] to arrange BLS transport and contact [address] RN station once confirmed.     Transport packet, [employer] and [employer] completed.    Lucina Mellow) Barbette Merino, M.S.W.

## 2022-08-08 NOTE — Plan of Care (Signed)
Problem: Physical Therapy - Adult  Goal: By Discharge: Performs mobility at highest level of function for planned discharge setting.  See evaluation for individualized goals.  Description: FUNCTIONAL STATUS PRIOR TO ADMISSION: pt was admitted from [facility] IPR and was ambulating with a RW and gait belt and completed stair and family training. Pt was preparing to d/c home with 24 hour assistance, but was admitted to [facility] after seizure and new ICH    HOME SUPPORT PRIOR TO ADMISSION: The patient lived with spouse and has supportive daughter. Daughter reported the plan was to stay with the daughter during the day time and then spouse at night. .    Physical Therapy Goals  Initiated 08/02/2022  1.  Patient will move from supine to sit and sit to supine and roll side to side in bed with supervision/set-up within 7 day(s).    2.  Patient will perform sit to stand with supervision/set-up within 7 day(s).  3.  Patient will transfer from bed to chair and chair to bed with supervision/set-up using the least restrictive device within 7 day(s).  4.  Patient will ambulate with CGA for 75 feet with the least restrictive device within 7 day(s).   5.  Patient will ascend/descend 4 stairs with bilateral handrail(s) with minimal assistance within 7 day(s).  6.  Patient will improve Berg Balance score by 7 points within 7 days.    08/08/2022 1508 by Georgiann Mccoy, PT  Outcome: Progressing     Problem: Occupational Therapy - Adult  Goal: By Discharge: Performs self-care activities at highest level of function for planned discharge setting.  See evaluation for individualized goals.  Description: FUNCTIONAL STATUS PRIOR TO ADMISSION:  Patient was highly IND at baseline, however recently suffered a L frontal CVA, d/c to IPR where she was set to d/c home with SPV and rollator use with 24/7 family assist/SPV, mild RUE/RLE weakness and aphasia.      HOME SUPPORT: Patient lived with her husband, was planning to d/c home with [facility] therapy,  was going to stay with her daughter during the day while her husband is at work.     Occupational Therapy Goals  Initiated 08/02/2022   1.  Patient will perform grooming at the sink with Supervision within 7 day(s).  2.  Patient will perform bathing with Supervision within 7 day(s).  3.  Patient will perform upper and lower body dressing with Supervision within 7 day(s).  4.  Patient will perform toilet transfers with Supervision within 7 day(s).  5.  Patient will perform all aspects of toileting with Supervision within 7 day(s).  6.  Patient will participate in upper extremity therapeutic exercise/activities with Supervision within 7 day(s).    7.  Patient will utilize energy conservation techniques during functional activities with verbal and visual cues within 7 day(s).  8.  Patient will complete the Fugl Meyer within 7 days.    08/08/2022 1033 by Harlow Ohms, OT  Outcome: Progressing

## 2022-08-08 NOTE — Discharge Summary (Addendum)
Discharge Summary       PATIENT ID: Kelsey Mullins  MRN: [id number]   DATE OF BIRTH: [birth year]    DATE OF ADMISSION: 08/01/2022  7:40 PM    DATE OF DISCHARGE: 08/08/2022   PRIMARY CARE PROVIDER: Desma Mcgregor, MD     ATTENDING PHYSICIAN: Dr Teofilo Pod  DISCHARGING PROVIDER: Teofilo Pod, MD    To contact this individual call [phone] and ask the operator to page.  If unavailable ask to be transferred the Adult Hospitalist Department.    CONSULTATIONS: IP CONSULT TO NEUROSURGERY  IP CONSULT TO NEUROSURGERY  IP CONSULT TO NEUROLOGY  IP CONSULT TO NEUROINTERVENTIONAL SURGERY    PROCEDURES/SURGERIES: * No surgery found *    ADMITTING DIAGNOSES & HOSPITAL COURSE:   Kelsey Mullins is a 66 y.o. female who presents with seizure like activity from [facility].       66 y.o. female with a past medical history of CAD status post CABG, HLD, COPD, GERD, ICH 06/2022, who presented to [facility] ED for an episode of unresponsiveness and reported seizure like activity on 8/16.     She was initially admitted (7/13) due to chest pain, dizziness and shortness of breath at which time she was treated for an NSTEMI and started on Plavix, aspirin, and enoxaparin.  She subsequently developed impaired speech and right-sided weakness at which time a code stroke was called.  Imaging revealed a large left frontal intraparenchymal hemorrhage with extension into the ventricles with convexity subarachnoid blood redistribution with a 6 mm midline shift to the right on (7/14).  There was no CT evidence of aneurysm, AVM, occlusion or dissection.  At that time patient was admitted to the ICU where neurosurgery was consulted and maintained on nicardipine infusion and 3%.  No surgical intervention was necessary, G-tube placed and discharged to inpatient rehab at [facility].           ICH with mass effect/midline shift  -HOB > 30 degrees, aspiration reactions  -Goal SBP < 140 mmHg, PRN labetalol  -Continue keppra 750 mg Q 12 hours  -Tox  screen negative  -3% saline discontinued  -Await EEG  -PT/OT/SLP  -Appreciate neurosurgery, ?signed off  -8/19 staring spell witnessed, EEG did not reveal epileptiform discharges  -Repeat CT head unremarkable  -Keppra increased to 1gm BID  -Appreciate Neurology  -Appreciate NIS, needs Cerebral angiogram ans suggested to transfer to [facility] hospital     Probable anxiety: will consider starting on celexa  Mild leukocytosis: likely reactive--now resolved  History of CAD s/p CABG /recent NSTEMI  ?UTI on ceftriaxone 8/18--8/20     PENDING TEST RESULTS:   At the time of discharge the following test results are still pending: none    FOLLOW UP APPOINTMENTS:    PCP  Neuro  NIS    ADDITIONAL CARE RECOMMENDATIONS:   Fall/seizure precautions    DIET: cardiac diet    ACTIVITY: activity as tolerated      DISCHARGE MEDICATIONS:     Medication List        START taking these medications      levETIRAcetam 500 MG/5ML injection  Commonly known as: KEPPRA  Infuse 10 mLs intravenously in the morning and 10 mLs in the evening.     traZODone 50 MG tablet  Commonly known as: DESYREL  Take 1 tablet by mouth nightly as needed for Sleep     venlafaxine 37.5 MG tablet  Commonly known as: EFFEXOR  Take 1 tablet by mouth 2 times daily (  with meals)            CONTINUE taking these medications      atorvastatin 20 MG tablet  Commonly known as: LIPITOR     glipiZIDE 5 MG tablet  Commonly known as: GLUCOTROL     metoprolol succinate 25 MG extended release tablet  Commonly known as: TOPROL XL               Where to Get Your Medications        Information about where to get these medications is not yet available    Ask your nurse or doctor about these medications  levETIRAcetam 500 MG/5ML injection  traZODone 50 MG tablet  venlafaxine 37.5 MG tablet           NOTIFY YOUR PHYSICIAN FOR ANY OF THE FOLLOWING:   Fever over 101 degrees for 24 hours.   Chest pain, shortness of breath, fever, chills, nausea, vomiting, diarrhea, change in mentation, falling,  weakness, bleeding. Severe pain or pain not relieved by medications.  Or, any other signs or symptoms that you may have questions about.    DISPOSITION:    Home With:   OT  PT  HH  RN       Long term SNF/Inpatient Rehab    Independent/assisted living    Hospice   x Other: Inpatient hospital       PATIENT CONDITION AT DISCHARGE:     Functional status    Poor     Deconditioned    x Independent      Cognition    x Lucid     Forgetful     Dementia      Catheters/lines (plus indication)    Foley     PICC     PEG    x None      Code status    x Full code     DNR      PHYSICAL EXAMINATION AT DISCHARGE:    General : alert x 3, awake, no acute distress,   HEENT: PEERL, EOMI, moist mucus membrane, TM clear  Neck: supple, no JVD, no meningeal signs  Chest: Clear to auscultation bilaterally   CVS: S1 S2 heard, Capillary refill less than 2 seconds  Abd: soft/ Non tender, non distended, BS physiological,   Ext: no clubbing, no cyanosis, no edema, brisk 2+ DP pulses  Neuro/Psych: pleasant mood and affect, CN 2-12 grossly intact, sensory grossly within normal limit, Strength 5/5 in all extremities, DTR 1+ x 4  Skin: warm     CHRONIC MEDICAL DIAGNOSES:      Greater than 39 minutes were spent with the patient on counseling and coordination of care    Signed:   Teofilo Pod, MD  08/08/2022  4:47 PM

## 2022-08-08 NOTE — Plan of Care (Signed)
Problem: Physical Therapy - Adult  Goal: By Discharge: Performs mobility at highest level of function for planned discharge setting.  See evaluation for individualized goals.  Description: FUNCTIONAL STATUS PRIOR TO ADMISSION: pt was admitted from [facility] IPR and was ambulating with a RW and gait belt and completed stair and family training. Pt was preparing to d/c home with 24 hour assistance, but was admitted to [facility] after seizure and new ICH    HOME SUPPORT PRIOR TO ADMISSION: The patient lived with spouse and has supportive daughter. Daughter reported the plan was to stay with the daughter during the day time and then spouse at night. .    Physical Therapy Goals  Initiated 08/02/2022  1.  Patient will move from supine to sit and sit to supine and roll side to side in bed with supervision/set-up within 7 day(s).    2.  Patient will perform sit to stand with supervision/set-up within 7 day(s).  3.  Patient will transfer from bed to chair and chair to bed with supervision/set-up using the least restrictive device within 7 day(s).  4.  Patient will ambulate with CGA for 75 feet with the least restrictive device within 7 day(s).   5.  Patient will ascend/descend 4 stairs with bilateral handrail(s) with minimal assistance within 7 day(s).  6.  Patient will improve Berg Balance score by 7 points within 7 days.    Outcome: Progressing   PHYSICAL THERAPY TREATMENT    Patient: Kelsey Mullins (66 y.o. female)  Date: 08/08/2022  Diagnosis: Intracranial hemorrhage (HCC) [I62.9]  Intraparenchymal hematoma of brain, left, with loss of consciousness of 30 minutes or less, initial encounter (HCC) [S06.321A] Intracranial hemorrhage (HCC)      Precautions: Fall Risk (SBP <140)                    ASSESSMENT:  Patient continues to benefit from skilled PT services and is progressing towards goals. Patient found seated in chair and agreeable to PT. OT reported patient was significantly orthostatic and symptomatic during OT  tx session this morning. Orthostatic assessment completed during PT tx session with no further orthostatic hypotension demonstrated. Patient completing sit <> stand transfers with CGA and progressing in activity tolerance by ambulating 40 feet with RW and up to min A x 1. Assist required to correct LOBs caused by L lateral lean, L LE circumduction, and R knee instability/ near-buckling. At times patient demonstrating scissoring pattern, but able to correct with verbal and visual cues. Patient returned to room and left seated in chair with all needs in reach. Patient continues to be well below PLOF and will benefit from continued intensive therapy at discharge.       08/08/22 1345 08/08/22 1348 08/08/22 1356   Vital Signs   Pulse 91 99 (!) 105   BP 109/68 115/72 127/89   MAP (Calculated) 82 86 102   Patient Position Sitting;Up in chair Standing (pre-activity) Standing (post-gait)        PLAN:  Patient continues to benefit from skilled intervention to address the above impairments.  Continue treatment per established plan of care.    Recommendation for discharge: (in order for the patient to meet his/her long term goals): Therapy 3 hours/day 5-7 days/week    Other factors to consider for discharge: impaired cognition, high risk for falls, not safe to be alone, and concern for safely navigating or managing the home environment    IF patient discharges home will need the following DME: continuing  to assess with progress       SUBJECTIVE:   Patient stated, "I'm starting to get wobbly."    OBJECTIVE DATA SUMMARY:   Critical Behavior:  Orientation  Overall Orientation Status: Impaired  Orientation Level: Oriented to person;Disoriented to place (unable to select hospital from three options for place)  Cognition  Overall Cognitive Status: Exceptions  Arousal/Alertness: Delayed responses to stimuli  Following Commands: Follows one step commands with increased time;Follows one step commands with repetition  Attention Span:  Attends with cues to redirect  Memory: Decreased recall of recent events;Decreased short term memory;Decreased long term memory  Safety Judgement: Decreased awareness of need for assistance;Decreased awareness of need for safety  Insights: Decreased awareness of deficits  Initiation: Requires cues for some  Sequencing: Requires cues for some    Functional Mobility Training:  Bed Mobility:  Bed Mobility Training  Bed Mobility Training: No  Overall Level of Assistance: Supervision  Interventions: Verbal cues  Supine to Sit: Supervision  Scooting: Stand-by assistance  Transfers:  Transfer Training  Transfer Training: Yes  Interventions: Safety awareness training;Tactile cues;Verbal cues  Sit to Stand: Contact-guard assistance  Stand to Sit: Contact-guard assistance  Balance:  Balance  Sitting: Intact  Sitting - Static: Good (unsupported)  Sitting - Dynamic: Good (unsupported)  Standing: Impaired  Standing - Static: Fair;Constant support  Standing - Dynamic: Fair;Poor;Constant support   Ambulation/Gait Training:     Gait  Overall Level of Assistance: Minimum assistance;Contact-guard assistance;Assist X1  Interventions: Safety awareness training;Verbal cues  Base of Support: Narrowed;Center of gravity altered  Speed/Cadence: Shuffled  Step Length: Right shortened;Left shortened  Swing Pattern: Right asymmetrical  Gait Abnormalities: Trunk sway increased;Path deviations;Decreased step clearance;Scissoring;Circumduction  Distance (ft): 40 Feet  Assistive Device: Gait belt;Walker, rolling    Pain Rating:  0/10   Pain Intervention(s):   pain is at a level acceptable to the patient    Activity Tolerance:   Good, requires rest breaks, and SpO2 stable on room air    After treatment:   Patient left in no apparent distress sitting up in chair, Call bell within reach, and Bed/ chair alarm activated      COMMUNICATION/EDUCATION:   The patient's plan of care was discussed with: occupational therapist and registered nurse    Patient  Education  Education Given To: Patient  Education Provided: Plan of Care;Role of Therapy;Energy Conservation;Fall Prevention Strategies;Transfer Training;Equipment  Education Method: Verbal  Barriers to Learning: None  Education Outcome: Continued education needed;Verbalized understanding      Georgiann Mccoy, PT, DPT  Minutes: 17

## 2022-08-08 NOTE — Plan of Care (Signed)
Problem: Discharge Planning  Goal: Discharge to home or other facility with appropriate resources  08/08/2022 1102 by Rande Brunt, RN  Outcome: Progressing  Flowsheets (Taken 08/08/2022 0800)  Discharge to home or other facility with appropriate resources:   Identify barriers to discharge with patient and caregiver   Arrange for needed discharge resources and transportation as appropriate   Identify discharge learning needs (meds, wound care, etc)   Arrange for interpreters to assist at discharge as needed   Refer to discharge planning if patient needs post-hospital services based on physician order or complex needs related to functional status, cognitive ability or social support system  08/07/2022 2150 by Lenoria Farrier, RN  Outcome: Progressing     Problem: Pain  Goal: Verbalizes/displays adequate comfort level or baseline comfort level  08/08/2022 1102 by Rande Brunt, RN  Outcome: Progressing  Flowsheets (Taken 08/08/2022 1003)  Verbalizes/displays adequate comfort level or baseline comfort level:   Encourage patient to monitor pain and request assistance   Assess pain using appropriate pain scale   Administer analgesics based on type and severity of pain and evaluate response   Implement non-pharmacological measures as appropriate and evaluate response   Consider cultural and social influences on pain and pain management   Notify Licensed Independent Practitioner if interventions unsuccessful or patient reports new pain  08/07/2022 2150 by Lenoria Farrier, RN  Outcome: Progressing     Problem: Skin/Tissue Integrity  Goal: Absence of new skin breakdown  Description: 1.  Monitor for areas of redness and/or skin breakdown  2.  Assess vascular access sites hourly  3.  Every 4-6 hours minimum:  Change oxygen saturation probe site  4.  Every 4-6 hours:  If on nasal continuous positive airway pressure, respiratory therapy assess nares and determine need for appliance change or resting period.  08/08/2022 1102 by Rande Brunt, RN  Outcome: Progressing  08/07/2022 2150 by Lenoria Farrier, RN  Outcome: Progressing     Problem: Safety - Adult  Goal: Free from fall injury  08/08/2022 1102 by Rande Brunt, RN  Outcome: Progressing  08/07/2022 2150 by Lenoria Farrier, RN  Outcome: Progressing     Problem: ABCDS Injury Assessment  Goal: Absence of physical injury  08/08/2022 1102 by Rande Brunt, RN  Outcome: Progressing  08/07/2022 2150 by Lenoria Farrier, RN  Outcome: Progressing     Problem: Occupational Therapy - Adult  Goal: By Discharge: Performs self-care activities at highest level of function for planned discharge setting.  See evaluation for individualized goals.  Description: FUNCTIONAL STATUS PRIOR TO ADMISSION:  Patient was highly IND at baseline, however recently suffered a L frontal CVA, d/c to [facility] where she was set to d/c home with SPV and rollator use with 24/7 family assist/SPV, mild RUE/RLE weakness and aphasia.      HOME SUPPORT: Patient lived with her husband, was planning to d/c home with [facility] therapy, was going to stay with her daughter during the day while her husband is at work.     Occupational Therapy Goals  Initiated 08/02/2022   1.  Patient will perform grooming at the sink with Supervision within 7 day(s).  2.  Patient will perform bathing with Supervision within 7 day(s).  3.  Patient will perform upper and lower body dressing with Supervision within 7 day(s).  4.  Patient will perform toilet transfers with Supervision within 7 day(s).  5.  Patient will perform all aspects of toileting with Supervision within 7 day(s).  6.  Patient will participate in upper extremity therapeutic exercise/activities with Supervision within 7 day(s).    7.  Patient will utilize energy conservation techniques during functional activities with verbal and visual cues within 7 day(s).  8.  Patient will complete the Fugl Meyer within 7 days.    08/08/2022 1033 by Harlow Ohms, OT  Outcome: Progressing     Problem: Chronic Conditions and  Co-morbidities  Goal: Patient's chronic conditions and co-morbidity symptoms are monitored and maintained or improved  Outcome: Progressing

## 2022-08-08 NOTE — Progress Notes (Signed)
[name]                                                                                          Hospitalist Progress Note  Teofilo Pod, MD  Office Phone: [phone]        Date of Service:  08/08/2022  NAME:  Kelsey Mullins  DOB:  06-21-1956  MRN:  [id number]       Admission Summary:   Kelsey Mullins is a 66 y.o. female who presents with seizure like activity from [facility].       66 y.o. 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     She was initially admitted (7/13) due to chest pain, dizziness and shortness of breath at which time she was treated for an NSTEMI and started on Plavix, aspirin, and enoxaparin.  She subsequently developed impaired speech and right-sided weakness at which time a code stroke was called.  Imaging revealed a large left frontal intraparenchymal hemorrhage with extension into the ventricles with convexity subarachnoid blood redistribution with a 6 mm midline shift to the right on (7/14).  There was no CT evidence of aneurysm, AVM, occlusion or dissection.  At that time patient was admitted to the ICU where neurosurgery was consulted and maintained on nicardipine infusion and 3%.  No surgical intervention was necessary, G-tube placed and discharged to inpatient rehab at [facility].         Interval history / Subjective:   Follow up ICH  No new issues overnight   Feels better  Per CM, [facility] still does not have any beds available  Assessment & Plan:     ICH with mass effect/midline shift  -HOB > 30 degrees, aspiration reactions  -Goal SBP < 140 mmHg, PRN labetalol  -Continue keppra 750 mg Q 12 hours  -Tox screen negative  -3% saline discontinued  -PT/OT/SLP  -Appreciate neurosurgery, ?signed off  -8/19 staring spell witnessed, EEG did not reveal epileptiform discharges  -Repeat CT  head unremarkable  -Keppra increased to 1gm BID  -Appreciate Neurology  -Appreciate NIS, needs Cerebral angiogram ans suggested to transfer to [facility] hospital     Probable anxiety: will consider starting on celexa  Mild leukocytosis: likely reactive--now resolved  History of CAD s/p CABG /recent NSTEMI  ?UTI on ceftriaxone 8/18--8/20        Cardiac diet     Code status: FULL CODE  Prophylaxis: scd    Plan: Awaiting transfer to [facility] Plan discussed with: Patient, family  Anticipated Disposition: ?today  Inpatient  Cardiac monitoring: Telemetry         Social Determinants of Health     Tobacco Use: Not on file   Alcohol Use: Not on file   Financial Resource Strain: Not on file   Food Insecurity: Not on file   Transportation Needs: Not on file   Physical Activity: Not on file   Stress: Not on file  Social Connections: Not on file   Intimate Partner Violence: Not on file   Depression: Not on file   Housing Stability: Not on file       Review of Systems:   Pertinent items are noted in HPI.       Vital Signs:    Last 24hrs VS reviewed since prior progress note. Most recent are:  Vitals:    08/08/22 1400   BP:    Pulse: 93   Resp:    Temp:    SpO2:        No intake or output data in the 24 hours ending 08/08/22 1456       Physical Examination:     I had a face to face encounter with this patient and independently examined them on 08/08/2022 as outlined below:          General : alert x 3, awake, no acute distress,   HEENT: PEERL, EOMI, moist mucus membrane, TM clear  Neck: supple, no JVD, no meningeal signs  Chest: Clear to auscultation bilaterally   CVS: S1 S2 heard, Capillary refill less than 2 seconds  Abd: soft/ non tender, non distended, BS physiological,   Ext: no clubbing, no cyanosis, no edema, brisk 2+ DP pulses  Neuro/Psych: pleasant mood and affect, CN 2-12 grossly intact, sensory grossly within normal limit, Strength 5/5 in all extremities, DTR 1+ x 4  Skin: warm     Data Review:    Review and/or order  of clinical lab test      I have personally and independently reviewed all pertinent labs, diagnostic studies, imaging, and have provided independent interpretation of the same.     Labs:     Recent Labs     08/07/22  0531 08/08/22  0516   WBC 6.2 5.2   HGB 15.6 15.1   HCT 47.4* 46.1   PLT 166 187       Recent Labs     08/06/22  0655 08/07/22  0531 08/08/22  0516   NA 134* 139 139   K 4.0 3.8 3.9   CL 102 104 106   CO2 25 29 27    BUN 13 13 13        No results for input(s): ALT, TP, ALB, GLOB, GGT, AML in the last 72 hours.    Invalid input(s): SGOT, GPT, AP, TBIL, TBILI, AMYP, LPSE, HLPSE    No results for input(s): INR, APTT in the last 72 hours.    Invalid input(s): PTP     No results for input(s): TIBC, FERR in the last 72 hours.    Invalid input(s): FE, PSAT   No results found for: FOL, RBCF   No results for input(s): PH, PCO2, PO2 in the last 72 hours.  No results for input(s): CPK in the last 72 hours.    Invalid input(s): CPKMB, CKNDX, TROIQ  Lab Results   Component Value Date/Time    CHOL 121 08/02/2022 04:37 AM    HDL 44 08/02/2022 04:37 AM     No results found for: GLUCPOC  @LABUA @    Notes reviewed from all clinical/nonclinical/nursing services involved in patient's clinical care. Care coordination discussions were held with appropriate clinical/nonclinical/ nursing providers based on care coordination needs.         Patients current active Medications were reviewed, considered, added and adjusted based on the clinical condition today.      Home Medications were reconciled to the best of my ability given all  available resources at the time of admission. Route is PO if not otherwise noted.      Admission Status:[id number]:::1}      Medications Reviewed:     Current Facility-Administered Medications   Medication Dose Route Frequency    guaiFENesin-dextromethorphan (ROBITUSSIN DM) 100-10 MG/5ML syrup 5 mL  5 mL Oral Q4H PRN    traZODone (DESYREL) tablet 50 mg  50 mg Oral Nightly PRN    levETIRAcetam (KEPPRA)  injection 1,000 mg  1,000 mg IntraVENous Q12H    labetalol (NORMODYNE;TRANDATE) injection 10 mg  10 mg IntraVENous Q6H PRN    famotidine (PEPCID) 20 mg in sodium chloride (PF) 0.9 % 10 mL injection  20 mg IntraVENous BID    glucose chewable tablet 16 g  4 tablet Oral PRN    dextrose bolus 10% 125 mL  125 mL IntraVENous PRN    Or    dextrose bolus 10% 250 mL  250 mL IntraVENous PRN    glucagon injection 1 mg  1 mg SubCUTAneous PRN    dextrose 10 % infusion   IntraVENous Continuous PRN    sodium chloride flush 0.9 % injection 5-40 mL  5-40 mL IntraVENous BID    venlafaxine (EFFEXOR) tablet 37.5 mg  37.5 mg Oral BID WC    sodium chloride flush 0.9 % injection 5-40 mL  5-40 mL IntraVENous 2 times per day    sodium chloride flush 0.9 % injection 5-40 mL  5-40 mL IntraVENous PRN    0.9 % sodium chloride infusion   IntraVENous PRN    acetaminophen (TYLENOL) tablet 650 mg  650 mg Oral Q4H PRN    ondansetron (ZOFRAN) injection 4 mg  4 mg IntraVENous Q6H PRN     ______________________________________________________________________  EXPECTED LENGTH OF STAY: 6  ACTUAL LENGTH OF STAY:          7                 Teofilo Pod, MD

## 2022-08-08 NOTE — Plan of Care (Signed)
Problem: Occupational Therapy - Adult  Goal: By Discharge: Performs self-care activities at highest level of function for planned discharge setting.  See evaluation for individualized goals.  Description: FUNCTIONAL STATUS PRIOR TO ADMISSION:  Patient was highly IND at baseline, however recently suffered a L frontal CVA, d/c to [facility] where she was set to d/c home with SPV and rollator use with 24/7 family assist/SPV, mild RUE/RLE weakness and aphasia.      HOME SUPPORT: Patient lived with her husband, was planning to d/c home with [facility] therapy, was going to stay with her daughter during the day while her husband is at work.     Occupational Therapy Goals  Initiated 08/02/2022   1.  Patient will perform grooming at the sink with Supervision within 7 day(s).  2.  Patient will perform bathing with Supervision within 7 day(s).  3.  Patient will perform upper and lower body dressing with Supervision within 7 day(s).  4.  Patient will perform toilet transfers with Supervision within 7 day(s).  5.  Patient will perform all aspects of toileting with Supervision within 7 day(s).  6.  Patient will participate in upper extremity therapeutic exercise/activities with Supervision within 7 day(s).    7.  Patient will utilize energy conservation techniques during functional activities with verbal and visual cues within 7 day(s).  8.  Patient will complete the Fugl Meyer within 7 days.    Outcome: Progressing   OCCUPATIONAL THERAPY TREATMENT  Patient: Kelsey Mullins (66 y.o. female)  Date: 08/08/2022  Primary Diagnosis: Intracranial hemorrhage (HCC) [I62.9]  Intraparenchymal hematoma of brain, left, with loss of consciousness of 30 minutes or less, initial encounter [facility]) [K44.010U]       Precautions: Fall Risk (SBP <140)                Chart, occupational therapy assessment, plan of care, and goals were reviewed.    ASSESSMENT  Patient continues to benefit from skilled OT services and is not progressing towards goals secondary to  symptomatic orthostatic hypotension.  Nursing cleared for therapy.  Received in bed, recently finished washing up with PCT seated in bathroom.  Oriented to self only.  Provided reorientation to place and time, poor carry over.  Very limited initiation in conversation, but she would attempt to answer questions.  Bed mob with SBA-supervision.  At EOB she appeared dizzy but did not verbalize without being asked.  Unable to progress OOB ADL tasks/mobility secondary to symptomatic orthostatic hypotension. Discussed with nursing.      Pulse BP Patient Position   08/08/22 1015 88 123/69 Semi fowlers   08/08/22 1008 (!) 108 83/62 Sitting   08/08/22 1003 94 121/73 Semi fowlers             PLAN :  Patient continues to benefit from skilled intervention to address the above impairments.  Continue treatment per established plan of care to address goals.    Recommend with staff: assess BP prior to OOB    Recommend next OT session: progress POC    Recommendation for discharge: (in order for the patient to meet his/her long term goals): Therapy 3 hours/day 5-7 days/week    Other factors to consider for discharge: poor safety awareness, impaired cognition, not safe to be alone, and concern for safely navigating or managing the home environment    IF patient discharges home will need the following DME: continuing to assess with progress       SUBJECTIVE:   Patient stated "yes."  when  asked if dizzy    OBJECTIVE DATA SUMMARY:   Cognitive/Behavioral Status:  Orientation  Overall Orientation Status: Impaired  Orientation Level: Oriented to person;Disoriented to place (unable to select hospital from three options for place)  Cognition  Overall Cognitive Status: Exceptions  Arousal/Alertness: Delayed responses to stimuli  Following Commands: Follows one step commands with increased time;Follows one step commands with repetition  Attention Span: Attends with cues to redirect  Memory: Decreased recall of recent events;Decreased short term  memory;Decreased long term memory  Safety Judgement: Decreased awareness of need for assistance;Decreased awareness of need for safety  Insights: Decreased awareness of deficits  Initiation: Requires cues for some  Sequencing: Requires cues for some    Functional Mobility and Transfers for ADLs:  Bed Mobility:  Bed Mobility Training  Bed Mobility Training: Yes  Overall Level of Assistance: Supervision  Interventions: Verbal cues  Supine to Sit: Supervision  Scooting: Stand-by assistance     Transfers:   Art therapist: No (unable to progress secondary to dizziness)      Balance:     Balance  Sitting: Intact  Sitting - Static: Good (unsupported)  Sitting - Dynamic: Fair (occasional)        Pain Rating:  No c/o pain           Activity Tolerance:   Fair   Please refer to the flowsheet for vital signs taken during this treatment.    After treatment:   Patient left in no apparent distress in bed, Call bell within reach, and Bed/ chair alarm activated    COMMUNICATION/EDUCATION:   The patient's plan of care was discussed with: physical therapist and registered nurse    Patient Education  Education Given To: Patient  Education Provided: Role of Therapy;Plan of Care  Education Method: Demonstration;Verbal  Barriers to Learning: Cognition  Education Outcome: Verbalized understanding;Demonstrated understanding;Continued education needed    Thank you for this referral.  Harlow Ohms, OT  Minutes: 16

## 2022-08-08 NOTE — Progress Notes (Signed)
I have reviewed discharge instructions with the patient and her husband. They verbalized understanding.       Bedside RN performed patient education and medication education. Discharge concerns initiated and discussed with patient, including clarification on "who" assists the patient at their home and instructions for when the home going patient should call their provider after discharge. Opportunity for questions and clarification was provided.      Patient receptive to education:Yes  Patient stated: Verbalized Understanding  Barriers to Education: Aphasia  Diagnosis Education given:  Yes    Length of stay: 7  Expected Day of Discharge: 08/08/22  Ask if they have "Help at Home" & add to white board?  Yes    Education Day #: 7    Medication Education Given:  Yes  M in the box Medication name: Keppra, Trazadone, Effexor    Pt aware of HCAHPS survey: Yes          Stroke Education documented in Patient Education: Yes  Core Measures Documented in [facility]:  Risk Factors: Yes  Warning signs of stroke: Yes  When to Activate 911: Yes  Medication Education for Risk Factors: Yes  Smoking cessation if applicable: Yes  Written Education Given:  Yes    Discharge NIH Completed: Yes  Score: 1

## 2022-08-08 NOTE — Progress Notes (Signed)
Brief NIS Note    Informed by Hospitalist earlier that transfer may not occur because there was no bed at [facility].  I rounded on patient, spoke with her and husband at the bedside.  They have, in face, been given a bed.  Per conversation with Dr. Cain Saupe, she is stable to transfer from a neuro standpoint and this would be the preferable given her insurance status.

## 2022-08-08 NOTE — Progress Notes (Signed)
Transfer Out report called to [facility]. Report given to Tinda,RN      [facility] ETA of 2045

## 2022-08-09 MED FILL — FAMOTIDINE (PF) 20 MG/2ML IV SOLN: 20 MG/2ML | INTRAVENOUS | Qty: 2

## 2022-08-09 MED FILL — LEVETIRACETAM 500 MG/5ML IV SOLN: 500 MG/5ML | INTRAVENOUS | Qty: 10
